# Patient Record
Sex: Male | Born: 1995
Health system: Southern US, Community
[De-identification: ages and names within clinical notes are randomized; demographics above are authoritative.]

## PROBLEM LIST (undated history)

## (undated) DIAGNOSIS — R5383 Other fatigue: Secondary | ICD-10-CM

## (undated) DIAGNOSIS — E301 Precocious puberty: Secondary | ICD-10-CM

## (undated) DIAGNOSIS — R109 Unspecified abdominal pain: Secondary | ICD-10-CM

## (undated) DIAGNOSIS — E049 Nontoxic goiter, unspecified: Secondary | ICD-10-CM

## (undated) DIAGNOSIS — F909 Attention-deficit hyperactivity disorder, unspecified type: Secondary | ICD-10-CM

## (undated) DIAGNOSIS — R112 Nausea with vomiting, unspecified: Secondary | ICD-10-CM

## (undated) DIAGNOSIS — E063 Autoimmune thyroiditis: Secondary | ICD-10-CM

## (undated) DIAGNOSIS — R6250 Unspecified lack of expected normal physiological development in childhood: Secondary | ICD-10-CM

## (undated) DIAGNOSIS — J45909 Unspecified asthma, uncomplicated: Secondary | ICD-10-CM

## (undated) DIAGNOSIS — R0602 Shortness of breath: Secondary | ICD-10-CM

## (undated) HISTORY — DX: Unspecified asthma, uncomplicated: J45.909

## (undated) HISTORY — DX: Unspecified lack of expected normal physiological development in childhood: R62.50

## (undated) HISTORY — DX: Unspecified abdominal pain: R10.9

## (undated) HISTORY — DX: Other disorders of iron metabolism: E83.19

## (undated) HISTORY — DX: Nausea with vomiting, unspecified: R11.2

## (undated) HISTORY — PX: TONSILLECTOMY AND ADENOIDECTOMY: SHX28

## (undated) HISTORY — PX: FRENULECTOMY, LINGUAL: SHX1681

## (undated) HISTORY — DX: Autoimmune thyroiditis: E06.3

## (undated) HISTORY — DX: Nontoxic goiter, unspecified: E04.9

## (undated) HISTORY — DX: Shortness of breath: R06.02

## (undated) HISTORY — DX: Other fatigue: R53.83

## (undated) HISTORY — PX: CHALAZION EXCISION: SHX213

## (undated) HISTORY — DX: Precocious puberty: E30.1

## (undated) HISTORY — DX: Attention-deficit hyperactivity disorder, unspecified type: F90.9

---
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
## 2014-01-02 NOTE — Assessment & Plan Note (Signed)
Confirmed on recent labs.  Has appt upcoming w/ hematology.  Pt has adopted a low iron diet in attempts to improve #s.  Will follow.

## 2014-01-02 NOTE — Assessment & Plan Note (Signed)
Improved since limiting amount of iron in diet.  Has appt upcoming w/ heme.  Will continue to monitor.

## 2014-01-02 NOTE — Assessment & Plan Note (Signed)
Unchanged.  Stressed need for NSAIDs- either naproxen or advil OTC.  Offered ortho appt but pt and family prefer to wait until after they see heme and determine if this is a consequence of high iron or something unrelated.  Will hold on referral at this time but if no improvement in pain, that will be next step.  Pt expressed understanding and is in agreement w/ plan.

## 2014-01-07 ENCOUNTER — Telehealth: Payer: Self-pay | Admitting: Hematology & Oncology

## 2014-01-07 NOTE — Telephone Encounter (Signed)
Left vm w NEW PATIENT today to remind them of their appointment with Dr. Ennever. Also, advised them to bring all meds and insurance information. ° °

## 2014-01-10 ENCOUNTER — Encounter: Payer: Self-pay | Admitting: Hematology & Oncology

## 2014-01-10 ENCOUNTER — Ambulatory Visit (HOSPITAL_BASED_OUTPATIENT_CLINIC_OR_DEPARTMENT_OTHER): Payer: 59 | Admitting: Hematology & Oncology

## 2014-01-10 ENCOUNTER — Other Ambulatory Visit (HOSPITAL_BASED_OUTPATIENT_CLINIC_OR_DEPARTMENT_OTHER): Payer: 59 | Admitting: Lab

## 2014-01-10 ENCOUNTER — Ambulatory Visit: Payer: 59

## 2014-01-10 LAB — CBC WITH DIFFERENTIAL (CANCER CENTER ONLY)
BASO#: 0 10*3/uL (ref 0.0–0.2)
BASO%: 0.8 % (ref 0.0–2.0)
EOS ABS: 0 10*3/uL (ref 0.0–0.5)
EOS%: 0.8 % (ref 0.0–7.0)
HCT: 41.2 % (ref 38.7–49.9)
HGB: 14.9 g/dL (ref 13.0–17.1)
LYMPH#: 1.6 10*3/uL (ref 0.9–3.3)
LYMPH%: 33.4 % (ref 14.0–48.0)
MCH: 33.3 pg (ref 28.0–33.4)
MCHC: 36.2 g/dL — ABNORMAL HIGH (ref 32.0–35.9)
MCV: 92 fL (ref 82–98)
MONO#: 0.4 10*3/uL (ref 0.1–0.9)
MONO%: 8.4 % (ref 0.0–13.0)
NEUT#: 2.7 10*3/uL (ref 1.5–6.5)
NEUT%: 56.6 % (ref 40.0–80.0)
PLATELETS: 220 10*3/uL (ref 145–400)
RBC: 4.48 10*6/uL (ref 4.20–5.70)
RDW: 12.9 % (ref 11.1–15.7)
WBC: 4.8 10*3/uL (ref 4.0–10.0)

## 2014-01-10 NOTE — Progress Notes (Signed)
Referral MD  Reason for Referral: Possible hemochromatosis   Chief Complaint  Patient presents with  . NEW PATIENT  : I have to much iron my body  HPI: Keith Lawrence is a really nice 18 year old gentleman. He does have some endocrine issues. He hasn't seen an endocrinologist.  He is followed by Dr. Beverely Low. Dr. Beverely Low has done some lab work on him.  He has some problems with his left eye. He has seen ophthalmology.  Dr. Beverely Low did some iron studies. She found that he had an eye saturation of 74%. His a total iron was at 237. No ferritin was done.  On his a CBC, this was okay. Hemoglobin was 16 hematocrit 45.  She also did liver studies. Liver function tests were okay.  He does have hypothyroidism. These were checked and found to be okay.  As far as she knows, no in the family has a tab of liver issues.  He really does not drink. He does smoke about half pack per day. He has no obvious occupational exposures.  We were asked to see him to see if there is any issue with hemachromatosis.            Past Medical History  Diagnosis Date  . Physical growth delay   . ADHD (attention deficit hyperactivity disorder)   . Goiter   . Hypothyroidism, acquired, autoimmune   . Thyroiditis, autoimmune   . Isosexual precocity   . Fatigue   . Iron excess   :  Past Surgical History  Procedure Laterality Date  . Chalazion excision    . Frenulectomy, lingual    . Tonsillectomy and adenoidectomy    :  Current outpatient prescriptions:loratadine (CLARITIN) 10 MG tablet, Take 10 mg by mouth daily., Disp: , Rfl: ;  naproxen (NAPROSYN) 500 MG tablet, Take 1 tablet (500 mg total) by mouth 2 (two) times daily with a meal., Disp: 60 tablet, Rfl: 0;  prednisoLONE acetate (PRED FORTE) 1 % ophthalmic suspension, Place 1 drop into the left eye 2 (two) times daily., Disp: , Rfl:  SYNTHROID 25 MCG tablet, Take 1 tablet (25 mcg total) by mouth daily before breakfast., Disp: 30 tablet, Rfl: 5;   valACYclovir (VALTREX) 1000 MG tablet, Take 1,000 mg by mouth daily., Disp: , Rfl: :  :  No Known Allergies:  Family History  Problem Relation Age of Onset  . Thyroid disease Maternal Grandmother   . Heart disease Maternal Grandmother   . Diabetes Neg Hx   . Thyroid disease Paternal Aunt   . Heart disease Paternal Grandfather   :  History   Social History  . Marital Status: Single    Spouse Name: N/A    Number of Children: N/A  . Years of Education: N/A   Occupational History  . Not on file.   Social History Main Topics  . Smoking status: Current Every Day Smoker -- 0.50 packs/day    Types: Cigarettes    Start date: 01/11/2012  . Smokeless tobacco: Never Used     Comment: wants to quit  . Alcohol Use: No  . Drug Use: No     Comment: mushrooms  . Sexual Activity: No   Other Topics Concern  . Not on file   Social History Narrative   Lives with foster parents. Mom still has guardianship.  11th grade at [employer].   :  Pertinent items are noted in HPI.  Exam: @IPVITALS @  well-developed well-nourished gentleman who is of short  stature. Vital signs are temperature of 98.3. Pulse 65. Blood pressure 119/54. Weight is 135 pounds. Head exam is no ocular or oral lesions. There are no palpable cervical or supraclavicular lymph nodes. He does has a slight ptosis of the left eye. Pupils reacted properly. Thyroid is nonpalpable. Lungs are clear and bilaterally. Cardiac exam regular rate rhythm with no murmurs rubs or bruits. Abdomen is soft. Has good bowel sounds. There is no fluid wave. There is a palpable hepatospleno megaly back exam no tenderness over the spine ribs or hips. Extremities shows no clubbing cyanosis or edema. There may be some slight swelling of the first MCP joint of each hand. Skin exam no rashes ecchymosis or petechia. Neurological exam shows no focal neurological deficits.    Recent Labs  01/10/14 1529  WBC 4.8  HGB 14.9  HCT 41.2   PLT 220   No results found for this basename: NA, K, CL, CO2, GLUCOSE, BUN, CREATININE, CALCIUM,  in the last 72 hours  Blood smear review: Normochromic and normocytic red blood cells. There is no rouleau formation. There is no nucleated red cells. I see no teardrop cells. There is no schistocytes or spherocytes. White cells appear normal in morphology maturation. There is no hypersegmented polys. There is no atypical lymphocytes. Platelets are adequate number size.  Pathology: Not necessary     Assessment and Plan: [name] is an 18 year old gentleman. He has one test that I can see that shows too much iron. Need high iron saturation is often a good screening tool for hemachromatosis.  We will see what the genetic analysis shows. It would not surprise me if he has hemachromatosis. Again, with an iron saturation of 74%, this typically is a good marker.  If he does not have hemochromatosis, and then we will have to see what the rest of his iron studies show.  If he has hemachromatosis, I wouldn't think that this would be involved with his endocrinological issues. However, I do know uncontrolled hemachromatosis can affect the pituitary, thyroid, pancreas, and testicular function.  Assessment and 45 minutes with [name] and his stepmom. He is a really good Michelle PiperGuy. He is very funny. We talked a lot about sports.  I will get back in touch with him once I get the results back and then we can sort out what needs to be done.

## 2014-01-10 NOTE — Patient Instructions (Signed)
You Can Quit Smoking If you are ready to quit smoking or are thinking about it, congratulations! You have chosen to help yourself be healthier and live longer! There are lots of different ways to quit smoking. Nicotine gum, nicotine patches, a nicotine inhaler, or nicotine nasal spray can help with physical craving. Hypnosis, support groups, and medicines help break the habit of smoking. TIPS TO GET OFF AND STAY OFF CIGARETTES  Learn to predict your moods. Do not let a bad situation be your excuse to have a cigarette. Some situations in your life might tempt you to have a cigarette.  Ask friends and co-workers not to smoke around you.  Make your home smoke-free.  Never have "just one" cigarette. It leads to wanting another and another. Remind yourself of your decision to quit.  On a card, make a list of your reasons for not smoking. Read it at least the same number of times a day as you have a cigarette. Tell yourself everyday, "I do not want to smoke. I choose not to smoke."  Ask someone at home or work to help you with your plan to quit smoking.  Have something planned after you eat or have a cup of coffee. Take a walk or get other exercise to perk you up. This will help to keep you from overeating.  Try a relaxation exercise to calm you down and decrease your stress. Remember, you may be tense and nervous the first two weeks after you quit. This will pass.  Find new activities to keep your hands busy. Play with a pen, coin, or rubber band. Doodle or draw things on paper.  Brush your teeth right after eating. This will help cut down the craving for the taste of tobacco after meals. You can try mouthwash too.  Try gum, breath mints, or diet candy to keep something in your mouth. IF YOU SMOKE AND WANT TO QUIT:  Do not stock up on cigarettes. Never buy a carton. Wait until one pack is finished before you buy another.  Never carry cigarettes with you at work or at home.  Keep cigarettes  as far away from you as possible. Leave them with someone else.  Never carry matches or a lighter with you.  Ask yourself, "Do I need this cigarette or is this just a reflex?"  Bet with someone that you can quit. Put cigarette money in a piggy bank every morning. If you smoke, you give up the money. If you do not smoke, by the end of the week, you keep the money.  Keep trying. It takes 21 days to change a habit!  Talk to your doctor about using medicines to help you quit. These include nicotine replacement gum, lozenges, or skin patches. Document Released: 07/20/2009 Document Revised: 12/16/2011 Document Reviewed: 07/20/2009 [employer] Patient Information 2014 [employer].  

## 2014-01-11 ENCOUNTER — Encounter: Payer: Self-pay | Admitting: Hematology & Oncology

## 2014-01-11 LAB — IRON AND TIBC CHCC
%SAT: 26 % (ref 20–55)
IRON: 78 ug/dL (ref 42–163)
TIBC: 303 ug/dL (ref 202–409)
UIBC: 224 ug/dL (ref 117–376)

## 2014-01-11 LAB — FERRITIN CHCC: Ferritin: 50 ng/ml (ref 22–316)

## 2014-01-13 ENCOUNTER — Ambulatory Visit: Payer: 59 | Admitting: Hematology & Oncology

## 2014-01-13 ENCOUNTER — Other Ambulatory Visit: Payer: 59 | Admitting: Lab

## 2014-01-13 ENCOUNTER — Ambulatory Visit: Payer: 59

## 2014-01-13 LAB — COMPREHENSIVE METABOLIC PANEL
ALBUMIN: 4.6 g/dL (ref 3.5–5.2)
ALK PHOS: 53 U/L (ref 39–117)
ALT: 15 U/L (ref 0–53)
AST: 16 U/L (ref 0–37)
BUN: 10 mg/dL (ref 6–23)
CO2: 28 mEq/L (ref 19–32)
Calcium: 9.9 mg/dL (ref 8.4–10.5)
Chloride: 102 mEq/L (ref 96–112)
Creatinine, Ser: 0.88 mg/dL (ref 0.50–1.35)
GLUCOSE: 113 mg/dL — AB (ref 70–99)
POTASSIUM: 3.9 meq/L (ref 3.5–5.3)
Sodium: 141 mEq/L (ref 135–145)
TOTAL PROTEIN: 6.7 g/dL (ref 6.0–8.3)
Total Bilirubin: 0.5 mg/dL (ref 0.2–1.1)

## 2014-01-13 LAB — HEMOGLOBINOPATHY EVALUATION
HGB A2 QUANT: 2.6 % (ref 2.2–3.2)
Hemoglobin Other: 0 %
Hgb A: 97.4 % (ref 96.8–97.8)
Hgb F Quant: 0 % (ref 0.0–2.0)
Hgb S Quant: 0 %

## 2014-01-13 LAB — RETICULOCYTES (CHCC)
ABS RETIC: 45.3 10*3/uL (ref 19.0–186.0)
RBC.: 4.53 MIL/uL (ref 4.22–5.81)
RETIC CT PCT: 1 % (ref 0.4–2.3)

## 2014-01-13 LAB — HEMOCHROMATOSIS DNA-PCR(C282Y,H63D): DNA Mutation Analysis: NOT DETECTED

## 2014-01-19 ENCOUNTER — Telehealth: Payer: Self-pay | Admitting: *Deleted

## 2014-01-19 NOTE — Telephone Encounter (Signed)
Message copied by Anselm JunglingBARTKO, Dinnis Rog ELLEN O on Wed Jan 19, 2014 11:49 AM ------      Message from: Arlan OrganENNEVER, PETER R      Created: Mon Jan 17, 2014  6:14 PM       I left a message for his stepmother. I told her in the message that he does not have hemochromatosis. His iron studies are normal. His genetic test is negative for hemachromatosis. We do not have to see him back. I don't see that there is any issues with iron with him. I cannot explain why he had that elevated iron before and but he is not ------

## 2014-01-26 ENCOUNTER — Other Ambulatory Visit: Payer: Self-pay | Admitting: Pediatric Endocrinology

## 2014-07-04 ENCOUNTER — Encounter: Payer: 59 | Admitting: Family Medicine

## 2015-04-12 ENCOUNTER — Telehealth: Payer: Self-pay | Admitting: Family Medicine

## 2015-04-12 ENCOUNTER — Ambulatory Visit (INDEPENDENT_AMBULATORY_CARE_PROVIDER_SITE_OTHER): Payer: 59 | Admitting: Internal Medicine

## 2015-04-12 ENCOUNTER — Encounter: Payer: Self-pay | Admitting: Internal Medicine

## 2015-04-12 VITALS — BP 102/64 | HR 62 | Temp 97.8°F | Ht 62.0 in | Wt 125.0 lb

## 2015-04-12 DIAGNOSIS — H0013 Chalazion right eye, unspecified eyelid: Secondary | ICD-10-CM

## 2015-04-12 NOTE — Progress Notes (Signed)
Pre visit review using our clinic review tool, if applicable. No additional management support is needed unless otherwise documented below in the visit note. 

## 2015-04-12 NOTE — Progress Notes (Signed)
   Subjective:    Patient ID: Keith Lawrence, male    DOB: [birth year], 19 y.o.   MRN: [id number]  DOS:  04/12/2015 Type of visit - description : Acute Interval history: Symptoms started a week ago with swelling and mild tenderness on the right upper eyelid. Denies fever or chills Has not seen any discharge No pain with eye movement however the area is TTP   Review of Systems   Past Medical History  Diagnosis Date  . Physical growth delay   . ADHD (attention deficit hyperactivity disorder)   . Goiter   . Hypothyroidism, acquired, autoimmune   . Thyroiditis, autoimmune   . Isosexual precocity   . Fatigue   . Iron excess     Past Surgical History  Procedure Laterality Date  . Chalazion excision    . Frenulectomy, lingual    . Tonsillectomy and adenoidectomy      History   Social History  . Marital Status: Single    Spouse Name: N/A  . Number of Children: N/A  . Years of Education: N/A   Occupational History  . Not on file.   Social History Main Topics  . Smoking status: Former Smoker -- 0.50 packs/day    Types: Cigarettes    Start date: 01/11/2012  . Smokeless tobacco: Never Used     Comment: quit   . Alcohol Use: No  . Drug Use: No     Comment: mushrooms  . Sexual Activity: No   Other Topics Concern  . Not on file   Social History Narrative   Lives with foster parents. Mom still has guardianship.  [employer].         Medication List       This list is accurate as of: 04/12/15  5:32 PM.  Always use your most recent med list.               SYNTHROID 25 MCG tablet  Generic drug:  levothyroxine  Take 1 tablet (25 mcg total) by mouth daily before breakfast.           Objective:   Physical Exam  Constitutional: He appears well-developed and well-nourished. No distress.  HENT:  Head: Normocephalic and atraumatic.  Eyes:    Skin: He is not diaphoretic.   BP 102/64 mmHg  Pulse 62  Temp(Src) 97.8 F (36.6 C) (Oral)  Ht 5\' 2"   (1.575 m)  Wt 125 lb (56.7 kg)  BMI 22.86 kg/m2  SpO2 99%       Assessment & Plan:    Chalazion , Findings consistent with chalazion, urgent referral to ophthalmology, at this point there is no evidence of periorbital cellulitis

## 2015-04-12 NOTE — Patient Instructions (Signed)
We are referring you to the eye doctor today

## 2015-04-12 NOTE — Telephone Encounter (Signed)
[facility] Relation to pt: Call back number: Pharmacy:  Reason for call:   Eunice BlaseDebbie states that patient "no showed" to appointment today

## 2015-04-12 NOTE — Telephone Encounter (Signed)
Noted, we were never able to get in contact with Pt. Pt's cell phone number was disconnected. Tried calling Pt's step mom's number and fathers number and left messages regarding appt but was never able to speak with anyone.

## 2015-06-19 ENCOUNTER — Telehealth: Payer: Self-pay | Admitting: *Deleted

## 2015-06-19 NOTE — Telephone Encounter (Signed)
Received a message from the answering service that patient needs synthroid refill. I called [phone], the message identified it as "Keith Lawrence" which is what he prefers. I LVM and explained that we have not seen him in 2 years and cannot refill this. I advised for him to ask Dr. Beverely Low if she will manage his thyroid medicine. Since he is turning 19, we haven't seen him in 2 years and we are no longer taking new adult patients, he would need to speak to his PCP or be referred to an adult endo.

## 2015-08-12 ENCOUNTER — Other Ambulatory Visit: Payer: Self-pay | Admitting: "Endocrinology

## 2015-08-26 ENCOUNTER — Emergency Department (HOSPITAL_COMMUNITY): Payer: 59

## 2015-08-26 ENCOUNTER — Emergency Department (HOSPITAL_COMMUNITY)
Admission: EM | Admit: 2015-08-26 | Discharge: 2015-08-26 | Disposition: A | Discharge status: Home / Self Care | Payer: 59 | Attending: Emergency Medicine | Admitting: Emergency Medicine

## 2015-08-26 DIAGNOSIS — Z87891 Personal history of nicotine dependence: Secondary | ICD-10-CM | POA: Insufficient documentation

## 2015-08-26 DIAGNOSIS — R079 Chest pain, unspecified: Secondary | ICD-10-CM | POA: Insufficient documentation

## 2015-08-26 DIAGNOSIS — E038 Other specified hypothyroidism: Secondary | ICD-10-CM | POA: Insufficient documentation

## 2015-08-26 DIAGNOSIS — Z79899 Other long term (current) drug therapy: Secondary | ICD-10-CM | POA: Insufficient documentation

## 2015-08-26 DIAGNOSIS — R05 Cough: Secondary | ICD-10-CM | POA: Insufficient documentation

## 2015-08-26 DIAGNOSIS — Z8659 Personal history of other mental and behavioral disorders: Secondary | ICD-10-CM | POA: Insufficient documentation

## 2015-08-26 DIAGNOSIS — R059 Cough, unspecified: Secondary | ICD-10-CM

## 2015-08-26 LAB — CBC WITH DIFFERENTIAL/PLATELET
BASOS ABS: 0 10*3/uL (ref 0.0–0.1)
Basophils Relative: 0 %
Eosinophils Absolute: 0.1 10*3/uL (ref 0.0–0.7)
Eosinophils Relative: 1 %
HEMATOCRIT: 47.9 % (ref 39.0–52.0)
Hemoglobin: 16.8 g/dL (ref 13.0–17.0)
Lymphocytes Relative: 22 %
Lymphs Abs: 1.5 10*3/uL (ref 0.7–4.0)
MCH: 30.9 pg (ref 26.0–34.0)
MCHC: 35.1 g/dL (ref 30.0–36.0)
MCV: 88.1 fL (ref 78.0–100.0)
Monocytes Absolute: 0.6 10*3/uL (ref 0.1–1.0)
Monocytes Relative: 8 %
NEUTROS ABS: 4.8 10*3/uL (ref 1.7–7.7)
NEUTROS PCT: 69 %
PLATELETS: 169 10*3/uL (ref 150–400)
RBC: 5.44 MIL/uL (ref 4.22–5.81)
RDW: 12.4 % (ref 11.5–15.5)
WBC: 6.9 10*3/uL (ref 4.0–10.5)

## 2015-08-26 LAB — BASIC METABOLIC PANEL
ANION GAP: 12 (ref 5–15)
BUN: 14 mg/dL (ref 6–20)
CO2: 23 mmol/L (ref 22–32)
Calcium: 9.9 mg/dL (ref 8.9–10.3)
Chloride: 102 mmol/L (ref 101–111)
Creatinine, Ser: 0.7 mg/dL (ref 0.61–1.24)
GFR calc Af Amer: >60 mL/min (ref >60)
GLUCOSE: 89 mg/dL (ref 65–99)
POTASSIUM: 4.4 mmol/L (ref 3.5–5.1)
Sodium: 137 mmol/L (ref 135–145)

## 2015-08-26 LAB — I-STAT TROPONIN, ED: Troponin i, poc: 0 ng/mL (ref 0.00–0.08)

## 2015-08-26 LAB — RAPID STREP SCREEN (MED CTR MEBANE ONLY): STREPTOCOCCUS, GROUP A SCREEN (DIRECT): NEGATIVE

## 2015-08-26 MED ORDER — BENZONATATE 100 MG PO CAPS
100.0000 mg | ORAL_CAPSULE | Freq: Once | ORAL | Status: AC
  Administered 2015-08-26: 100 mg via ORAL
  Filled 2015-08-26: qty 1

## 2015-08-26 MED ORDER — BENZONATATE 100 MG PO CAPS: 100.0000 mg | ORAL_CAPSULE | Freq: Three times a day (TID) | ORAL | Status: DC

## 2015-08-26 MED ORDER — IBUPROFEN 800 MG PO TABS
800.0000 mg | ORAL_TABLET | Freq: Once | ORAL | Status: AC
  Administered 2015-08-26: 800 mg via ORAL
  Filled 2015-08-26: qty 1
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
## 2015-08-26 NOTE — Discharge Instructions (Signed)
1. Medications: tessalon (cough medicine), usual home medications 2. Treatment: rest, drink plenty of fluids 3. Follow Up: please followup with your primary doctor for discussion of your diagnoses and further evaluation after today's visit; if you do not have a primary care doctor use the resource guide provided to find one; please return to the ER for severe chest pain or shortness of breath, new or worsening symptoms   Chest Wall Pain Chest wall pain is pain in or around the bones and muscles of your chest. Sometimes, an injury causes this pain. Sometimes, the cause may not be known. This pain may take several weeks or longer to get better. HOME CARE INSTRUCTIONS  Pay attention to any changes in your symptoms. Take these actions to help with your pain:   Rest as told by your health care provider.   Avoid activities that cause pain. These include any activities that use your chest muscles or your abdominal and side muscles to lift heavy items.   If directed, apply ice to the painful area:  Put ice in a plastic bag.  Place a towel between your skin and the bag.  Leave the ice on for 20 minutes, 2-3 times per day.  Take over-the-counter and prescription medicines only as told by your health care provider.  Do not use tobacco products, including cigarettes, chewing tobacco, and e-cigarettes. If you need help quitting, ask your health care provider.  Keep all follow-up visits as told by your health care provider. This is important. SEEK MEDICAL CARE IF:  You have a fever.  Your chest pain becomes worse.  You have new symptoms. SEEK IMMEDIATE MEDICAL CARE IF:  You have nausea or vomiting.  You feel sweaty or light-headed.  You have a cough with phlegm (sputum) or you cough up blood.  You develop shortness of breath.   This information is not intended to replace advice given to you by your health care provider. Make sure you discuss any questions you have with your health  care provider.   Document Released: 09/23/2005 Document Revised: 06/14/2015 Document Reviewed: 12/19/2014 Elsevier Interactive Patient Education 2016 [employer]   Emergency Department Resource Guide 1) Find a Doctor and Pay Out of Pocket Although you won't have to find out who is covered by your insurance plan, it is a good idea to ask around and get recommendations. You will then need to call the office and see if the doctor you have chosen will accept you as a new patient and what types of options they offer for patients who are self-pay. Some doctors offer discounts or will set up payment plans for their patients who do not have insurance, but you will need to ask so you aren't surprised when you get to your appointment.  2) Contact Your Local Health Department Not all health departments have doctors that can see patients for sick visits, but many do, so it is worth a call to see if yours does. If you don't know where your local health department is, you can check in your phone book. The CDC also has a tool to help you locate your state's health department, and many state websites also have listings of all of their local health departments.  3) Find a Walk-in Clinic If your illness is not likely to be very severe or complicated, you may want to try a walk in clinic. These are popping up all over the country in pharmacies, drugstores, and shopping centers. They're usually staffed by nurse practitioners  or physician assistants that have been trained to treat common illnesses and complaints. They're usually fairly quick and inexpensive. However, if you have serious medical issues or chronic medical problems, these are probably not your best option. ° °No Primary Care Doctor: °- Call [facility] Connect at  832-8000 - they can help you locate a primary care doctor that  accepts your insurance, provides certain services, etc. °- Physician Referral Service- 1-800-533-3463 ° °Chronic Pain  Problems: °Organization         Address  Phone   Notes  °[facility]  (336) 297-2271 Patients need to be referred by their primary care doctor.  ° °Medication Assistance: °Organization         Address  Phone   Notes  °[place] [facility] [address] °[place], [place] [address] (336) 641-8030 --Must be a resident of [place] °-- Must have NO insurance coverage whatsoever (no Medicaid/ Medicare, etc.) °-- The pt. MUST have a primary care doctor that directs their care regularly and follows them in the community °  °[facility]  (866) 331-1348   °[employer]  (888) 892-1162   ° °Agencies that provide inexpensive medical care: °Organization         Address  Phone   Notes  °[facility]  (336) 832-8035   °[facility]    (336) 832-7272   °[facility] [address] °[place], [place] [address] (336) 832-4777   °[facility] [address], °[place] (336) 271-4999   °[facility]    (336) 373-0678   °[facility]    (336) 272-1050   °[facility] ° [address], [place] Phone:  (336) 832-4444, Fax:  (336) 832-4440 Hours of Operation:  9 am - 6 pm, M-F.  Also accepts Medicaid/Medicare and self-pay.  °[facility] ° [address], [place] Phone: (336) 832-3150, Fax: (336) 832-3151. Hours of Operation:  8:30 am - 5:30 pm, M-F.  Also accepts Medicaid and self-pay.  °[facility] [place] [address], [place] Phone: (336) 878-6027   °[facility] [address], [place], [place] (336)723-1848, Ext. 123 Mondays & Thursdays: 7-9 AM.  First 15 patients are seen on a first come, first serve basis. °  ° °Medicaid-accepting [place] Providers: ° °Organization         Address  Phone   Notes  °[facility] [address], [place] (336) 641-2100 Also  accepts self-pay patients.  °[facility] [address], [place] ° (336) 856-9996   °[facility] [address] [place] [address], [place] (336) 288-8857   °[facility] [address] [place] [address], [place] (336) 299-7000   °Veita Bland [address], [place]  ° (336) 373-1557 Only accepts [place] Access Medicaid patients after they have their name applied to their card.  ° °Self-Pay (no insurance) in [place]: ° °Organization         Address  Phone   Notes  °Sickle Cell Patients, [facility] [address], [place] (336) 832-1970   °[facility] [address],  (336) 832-4400   ° [facility] ° [address], [place] (336) 992-4800   °[facility]/Dr. Osei-Bonsu ° [address]   [place] [address], [place] or [address], [place] (336) 841-8500 Phone number for both [place] and [place] locations is the same.  °[facility] [address], [place] (336) 299-0000   °[facility] [address] [place] [address], [place] or [address] (336) 852-7530 °(336) 878-2260   °[facility] [address], [place] (336) 350-1642, phone; (336) 294-5005, fax Sees patients 1st and 3rd Saturday of every month.  Must not qualify for public or private insurance (i.e. Medicaid, Medicare, [place] Health Choice, Veterans' Benefits) • Household income should be no more than 200% of the poverty level •The clinic cannot treat you if you are pregnant or think you are pregnant • Sexually transmitted diseases are not treated at the clinic.  ° ° °Dental Care: °Organization         Address  Phone  Notes  °[facility] [address], [place] (336) 641-6152 Accepts children up to age 21 who are enrolled in Medicaid or [place] Health Choice; pregnant  women with a Medicaid card; and children who have applied for Medicaid or [place] Health Choice, but were declined, whose parents can pay a reduced fee at time of service.  °[facility] [place]  [address], [place] (336) 641-7733 Accepts children up to age 21 who are enrolled in Medicaid or [place] Health Choice; pregnant women with a Medicaid card; and children who have applied for Medicaid or [place] Health Choice, but were declined, whose parents can pay a reduced fee at time of service.  °[facility] ° [address], [place] (336) 641-4533 Patients are seen by appointment only. Walk-ins are not accepted. [facility] will see patients 18 years of age and older. °Monday - Tuesday (8am-5pm) °Most Wednesdays (8:30-5pm) °$30 per visit, cash only  °[facility] ° [address], [place] (336) 641-4533 Patients are seen by appointment only. Walk-ins are not accepted. [facility] will see patients 18 years of age and older. °One Wednesday Evening (Monthly: Volunteer Based).  $30 per visit, cash only  °[facility]  (919) 537-3737 for adults; Children under age 4, call Graduate Pediatric Dentistry at (919) [phone]. Children aged 4-14, please call (919) [phone] to request a pediatric application. ° Dental services are provided in all areas of dental care including fillings, crowns and bridges, complete and partial dentures, implants, gum treatment, root canals, and extractions. Preventive care is also provided. Treatment is provided to both adults and children. °Patients are selected via a lottery and there is often a waiting list. °  °[facility] [address], °[place] ° (336) 763-8833 [email or web address] °  °[facility] [address], [place], [place] (336)723-1848, Ext. 123 Second and Fourth Thursday of each month, opens at 6:30 AM; Clinic ends at 9 AM.  Patients are  seen on a first-come first-served basis, and a limited number are seen during each clinic.  ° °[facility] ° [address] [place] [address], [place], [place] (336) 723-7904   Eligibility Requirements °You must have lived in [place], or [place] counties for at least the last three months. °  You cannot be eligible for state or federal sponsored healthcare insurance, including Veterans Administration, Medicaid, or Medicare. °  You generally cannot be eligible for healthcare insurance through your employer.  °    How to apply: °Eligibility screenings are held every Tuesday and Wednesday afternoon from 1:00 pm until 4:00 pm. You do not need an appointment for the interview!  °[facility] [address], [place], [place] 336-631-2330   °[facility]  336-342-8273   °[facility]  336-703-3100   °[facility]  336-570-6415   ° °Behavioral Health Resources in the Community: °Intensive Outpatient Programs °Organization         Address  Phone  Notes  °[facility] [address], [place], [place] 336-878-6098   °[facility] [address], [place], [place] 336-832-9800   °[facility] [address], [place], [place] ° 336-882-2125   °[facility] [address],  °[place], [place] 1-800-853-5163 or 336-641-4981   °Substance Abuse Resources °Organization         Address  Phone  Notes  °[facility]  336-882-2125   °[facility]  336-784-9470   °[facility]  336-285-9073   °[facility]  336-845-3988   °Residential & Outpatient Substance Abuse Program  1-800-659-3381   °Psychological Services °Organization         Address  Phone  Notes  °[facility]  336- 832-9600   °[facility]  336- 378-7881   °[facility] [address], [place] 1-800-853-5163 or 336-641-4981   ° °Mobile Crisis  Teams °Organization         Address  Phone  Notes  °[facility], Mobile Crisis Care Unit  1-877-626-1772   °Assertive °[facility] ° [address] [place], [place] 336-834-9664   °Sharon DeEsch [address] °[place] 336-554-5454   ° °Self-Help/Support Groups °Organization         Address  Phone             Notes  °[facility] - variety of support groups  336- 373-1402 Call for more information  °Narcotics Anonymous (NA), [facility] [address], °[place]  2 meetings at this location  ° °Residential Treatment Programs °Organization         Address  Phone  Notes  °[facility] [address],    °[place]  1-866-801-8205   °[facility] ° [address], [place], [place] 704-293-8524   °[facility] [address], [place] 336-845-3988 Admissions: 8am-3pm M-F  °[facility] [address]    °[place], [place] 336-841-1104   °[facility] [address], [place], [place] 336-379-7146   °[facility] [address]  °[place], [place] 336-285-9073   °[facility] Intensive Outpatient [address], [place], [place] 336-852-3033   °[facility] [address]  °[place], [place] 1-877-615-2722 or 336-784-9470   °[facility] [address] [place], [place] 336-227-7417 Accepts Medicaid  °[facility] [address]  °[place] 1-800-659-3381 Substance Abuse/Addiction Treatment  ° °[place] Behavioral Health Resources °Organization         Address  Phone  Notes  °[facility]  (888) 581-9988   °Julie Brannon, PhD [address] [place], [place]   (336) 349-5553 or (336) 951-0000   °[facility]   [address] °[place], [place] (336) 349-4454   °[facility] [address],   [place], [place] (336) 342-8316  Insurance/Medicaid/sponsorship through [facility]  °[facility] [address]                                    [place], [place] (336) 342-8316 Therapy/tele-psych/case  °[facility] [address]  ° [place], [place] (336) 349-2233    °Dr. Arfeen  (336) 349-4544   °[facility] [place]  [employer] [place] [facility] 1) [address], [place] °2) [address], [place] °3)  [address], [place] (336) 349-3220 °(336) 342-7768 ° °(336) 342-8140   °[place] [facility] (336) 342-1394 or (336) 342-3537 (After Hours)    ° ° ° °
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
## 2015-08-26 NOTE — ED Notes (Signed)
Bed: WA08 Expected date: 08/26/15 Expected time: 7:44 AM Means of arrival: Ambulance Comments: Reproducible chest pain 19 yo

## 2015-08-29 LAB — CULTURE, GROUP A STREP

## 2015-10-22 DIAGNOSIS — E039 Hypothyroidism, unspecified: Secondary | ICD-10-CM | POA: Diagnosis not present

## 2015-10-25 ENCOUNTER — Encounter (HOSPITAL_BASED_OUTPATIENT_CLINIC_OR_DEPARTMENT_OTHER): Payer: Self-pay

## 2015-10-25 ENCOUNTER — Emergency Department (HOSPITAL_BASED_OUTPATIENT_CLINIC_OR_DEPARTMENT_OTHER)
Admission: EM | Admit: 2015-10-25 | Discharge: 2015-10-25 | Disposition: A | Discharge status: Home / Self Care | Payer: 59 | Attending: Emergency Medicine | Admitting: Emergency Medicine

## 2015-10-25 DIAGNOSIS — Z8659 Personal history of other mental and behavioral disorders: Secondary | ICD-10-CM | POA: Diagnosis not present

## 2015-10-25 DIAGNOSIS — J069 Acute upper respiratory infection, unspecified: Secondary | ICD-10-CM | POA: Insufficient documentation

## 2015-10-25 DIAGNOSIS — Z87891 Personal history of nicotine dependence: Secondary | ICD-10-CM | POA: Diagnosis not present

## 2015-10-25 DIAGNOSIS — R52 Pain, unspecified: Secondary | ICD-10-CM | POA: Diagnosis present

## 2015-10-25 DIAGNOSIS — Z79899 Other long term (current) drug therapy: Secondary | ICD-10-CM | POA: Insufficient documentation

## 2015-10-25 DIAGNOSIS — Z113 Encounter for screening for infections with a predominantly sexual mode of transmission: Secondary | ICD-10-CM | POA: Insufficient documentation

## 2015-10-25 DIAGNOSIS — E038 Other specified hypothyroidism: Secondary | ICD-10-CM | POA: Diagnosis not present

## 2015-10-25 DIAGNOSIS — E049 Nontoxic goiter, unspecified: Secondary | ICD-10-CM | POA: Diagnosis not present

## 2015-10-25 LAB — URINALYSIS, ROUTINE W REFLEX MICROSCOPIC
BILIRUBIN URINE: NEGATIVE
Glucose, UA: NEGATIVE mg/dL
HGB URINE DIPSTICK: NEGATIVE
KETONES UR: NEGATIVE mg/dL
Leukocytes, UA: NEGATIVE
NITRITE: NEGATIVE
Protein, ur: NEGATIVE mg/dL
SPECIFIC GRAVITY, URINE: 1.027 (ref 1.005–1.030)
pH: 6.5 (ref 5.0–8.0)

## 2015-10-25 MED ORDER — GUAIFENESIN ER 600 MG PO TB12: 600.0000 mg | ORAL_TABLET | Freq: Two times a day (BID) | ORAL | Status: DC

## 2015-10-25 MED ORDER — BENZONATATE 100 MG PO CAPS: 100.0000 mg | ORAL_CAPSULE | Freq: Three times a day (TID) | ORAL | Status: DC | PRN

## 2015-10-25 MED ORDER — IBUPROFEN 800 MG PO TABS
800.0000 mg | ORAL_TABLET | Freq: Once | ORAL | Status: AC
  Administered 2015-10-25: 800 mg via ORAL
  Filled 2015-10-25: qty 1

## 2015-10-25 NOTE — Discharge Instructions (Signed)
You were seen in the emergency room for evaluation of cough, sore throat, and body aches. Your symptoms are most likely due to a virus. You may take  ibuprofen every 8 hours as needed for the body aches and sore throat. I will give you a prescription to help with your cough. Your urine test was normal today with no signs of infection.  We will send out your urine and bloodwork for STI screening and will call you if anything comes back positive. Please follow-up with your primary care provider within one week. Return to the ER for new or worsening symptoms.

## 2015-10-25 NOTE — ED Notes (Signed)
C/o body aches x 2 weeks-dry cough, sore throat x 1 week
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
## 2015-10-26 LAB — HIV ANTIBODY (ROUTINE TESTING W REFLEX): HIV Screen 4th Generation wRfx: NONREACTIVE

## 2015-10-26 LAB — GC/CHLAMYDIA PROBE AMP (~~LOC~~) NOT AT ARMC
Chlamydia: NEGATIVE
Neisseria Gonorrhea: NEGATIVE

## 2015-10-26 LAB — RPR: RPR Ser Ql: NONREACTIVE

## 2016-01-27 ENCOUNTER — Encounter (HOSPITAL_BASED_OUTPATIENT_CLINIC_OR_DEPARTMENT_OTHER): Payer: Self-pay

## 2016-01-27 ENCOUNTER — Emergency Department (HOSPITAL_BASED_OUTPATIENT_CLINIC_OR_DEPARTMENT_OTHER)
Admission: EM | Admit: 2016-01-27 | Discharge: 2016-01-27 | Disposition: A | Discharge status: Home / Self Care | Payer: 59 | Attending: Emergency Medicine | Admitting: Emergency Medicine

## 2016-01-27 DIAGNOSIS — K2970 Gastritis, unspecified, without bleeding: Secondary | ICD-10-CM | POA: Diagnosis not present

## 2016-01-27 DIAGNOSIS — F1290 Cannabis use, unspecified, uncomplicated: Secondary | ICD-10-CM

## 2016-01-27 DIAGNOSIS — Z87891 Personal history of nicotine dependence: Secondary | ICD-10-CM | POA: Diagnosis not present

## 2016-01-27 DIAGNOSIS — R1032 Left lower quadrant pain: Secondary | ICD-10-CM | POA: Diagnosis not present

## 2016-01-27 DIAGNOSIS — R1116 Cannabis hyperemesis syndrome: Secondary | ICD-10-CM

## 2016-01-27 DIAGNOSIS — R112 Nausea with vomiting, unspecified: Secondary | ICD-10-CM | POA: Insufficient documentation

## 2016-01-27 DIAGNOSIS — F1210 Cannabis abuse, uncomplicated: Secondary | ICD-10-CM | POA: Diagnosis not present

## 2016-01-27 DIAGNOSIS — F12988 Cannabis use, unspecified with other cannabis-induced disorder: Secondary | ICD-10-CM

## 2016-01-27 DIAGNOSIS — R197 Diarrhea, unspecified: Secondary | ICD-10-CM | POA: Diagnosis not present

## 2016-01-27 LAB — BASIC METABOLIC PANEL
Anion gap: 11 (ref 5–15)
BUN: 12 mg/dL (ref 6–20)
CALCIUM: 9.6 mg/dL (ref 8.9–10.3)
CO2: 23 mmol/L (ref 22–32)
CREATININE: 0.88 mg/dL (ref 0.61–1.24)
Chloride: 105 mmol/L (ref 101–111)
GFR calc Af Amer: >60 mL/min (ref >60)
GLUCOSE: 100 mg/dL — AB (ref 65–99)
POTASSIUM: 3.6 mmol/L (ref 3.5–5.1)
SODIUM: 139 mmol/L (ref 135–145)

## 2016-01-27 LAB — CBC WITH DIFFERENTIAL/PLATELET
BASOS ABS: 0 10*3/uL (ref 0.0–0.1)
BASOS PCT: 0 %
EOS ABS: 0.1 10*3/uL (ref 0.0–0.7)
EOS PCT: 1 %
HCT: 42 % (ref 39.0–52.0)
Hemoglobin: 15.4 g/dL (ref 13.0–17.0)
Lymphocytes Relative: 29 %
Lymphs Abs: 2.1 10*3/uL (ref 0.7–4.0)
MCH: 31.6 pg (ref 26.0–34.0)
MCHC: 36.7 g/dL — ABNORMAL HIGH (ref 30.0–36.0)
MCV: 86.1 fL (ref 78.0–100.0)
MONO ABS: 0.7 10*3/uL (ref 0.1–1.0)
Monocytes Relative: 10 %
Neutro Abs: 4.3 10*3/uL (ref 1.7–7.7)
Neutrophils Relative %: 60 %
PLATELETS: 210 10*3/uL (ref 150–400)
RBC: 4.88 MIL/uL (ref 4.22–5.81)
RDW: 12.1 % (ref 11.5–15.5)
WBC: 7.1 10*3/uL (ref 4.0–10.5)

## 2016-01-27 LAB — LIPASE, BLOOD: LIPASE: 14 U/L (ref 11–51)

## 2016-01-27 MED ORDER — PROMETHAZINE HCL 25 MG PO TABS: 25.0000 mg | ORAL_TABLET | Freq: Four times a day (QID) | ORAL | Status: DC | PRN

## 2016-01-27 MED ORDER — LORAZEPAM 2 MG/ML IJ SOLN
1.0000 mg | Freq: Once | INTRAMUSCULAR | Status: AC
  Administered 2016-01-27: 1 mg via INTRAVENOUS
  Filled 2016-01-27: qty 1

## 2016-01-27 MED ORDER — HALOPERIDOL LACTATE 5 MG/ML IJ SOLN
2.5000 mg | Freq: Once | INTRAMUSCULAR | Status: AC
  Administered 2016-01-27: 2.5 mg via INTRAVENOUS
  Filled 2016-01-27: qty 1

## 2016-01-27 MED ORDER — CAPSAICIN-MENTHOL-METHYL SAL 0.025-1-12 % EX CREA: 1.0000 [in_us] | TOPICAL_CREAM | Freq: Three times a day (TID) | CUTANEOUS | Status: DC | PRN

## 2016-01-27 MED ORDER — SODIUM CHLORIDE 0.9 % IV BOLUS (SEPSIS)
1000.0000 mL | Freq: Once | INTRAVENOUS | Status: AC
  Administered 2016-01-27: 1000 mL via INTRAVENOUS

## 2016-01-27 NOTE — ED Provider Notes (Signed)
CSN: [id number]     Arrival date & time 01/27/16  1957 History  By signing my name below, I, Marisue HumbleMichelle Chaffee, attest that this documentation has been prepared under the direction and in the presence of Nelva Nayobert Noya Santarelli, MD . Electronically Signed: Marisue HumbleMichelle Chaffee, Scribe. 01/27/2016. 8:46 PM.    Chief Complaint  Patient presents with  . Abdominal Pain   The history is provided by the patient. No language interpreter was used.   HPI Comments:  Keith Lawrence is a 20 y.o. male with PMHx of hypothyroidism who presents to the Emergency Department complaining of peristent nausea and vomiting for the past week. He states he can't keep anything down but still has an appetite. Pt reports associated LLQ abdominal pain. Pt has tried a hot shower with temporary relief. Pt has smoked mariajuana ~1 week ago. Pt requested his thyroid levels be checked. He is currently taking Synthroid for hypothyroidism.  Past Medical History  Diagnosis Date  . Physical growth delay   . ADHD (attention deficit hyperactivity disorder)   . Goiter   . Hypothyroidism, acquired, autoimmune   . Thyroiditis, autoimmune   . Isosexual precocity   . Fatigue   . Iron excess    Past Surgical History  Procedure Laterality Date  . Chalazion excision    . Frenulectomy, lingual    . Tonsillectomy and adenoidectomy     Family History  Problem Relation Age of Onset  . Thyroid disease Maternal Grandmother   . Heart disease Maternal Grandmother   . Diabetes Neg Hx   . Thyroid disease Paternal Aunt   . Heart disease Paternal Grandfather    Social History  Substance Use Topics  . Smoking status: Former Smoker -- 0.00 packs/day    Start date: 01/11/2012  . Smokeless tobacco: Never Used  . Alcohol Use: No    Review of Systems 10 systems reviewed and all are negative for acute change except as noted in the HPI.  Allergies  Review of patient's allergies indicates no known allergies.  Home Medications   Prior to Admission  medications   Medication Sig Start Date End Date Taking? Authorizing Provider  benzonatate (TESSALON) 100 MG capsule Take 1 capsule (100 mg total) by mouth 3 (three) times daily as needed for cough. 10/25/15   Ace GinsSerena Y Sam, PA-C  Capsaicin-Menthol-Methyl Sal (CAPSAICIN-METHYL SAL-MENTHOL) 0.025-1-12 % CREA Apply 1 inch topically 3 (three) times daily as needed. 01/27/16   Nelva Nayobert Arilynn Blakeney, MD  guaiFENesin (MUCINEX) 600 MG 12 hr tablet Take 1 tablet (600 mg total) by mouth 2 (two) times daily. 10/25/15   Carlene CoriaSerena Y Sam, PA-C  promethazine (PHENERGAN) 25 MG tablet Take 1 tablet (25 mg total) by mouth every 6 (six) hours as needed for nausea or vomiting. 01/27/16   Nelva Nayobert Roshelle Traub, MD  SYNTHROID 25 MCG tablet Take 1 tablet (25 mcg total) by mouth daily before breakfast. 08/23/13   Dessa PhiJennifer Badik, MD   BP 118/70 mmHg  Pulse 74  Temp(Src) 98.4 F (36.9 C) (Oral)  Resp 15  Ht 5\' 4"  (1.626 m)  Wt 130 lb (58.968 kg)  BMI 22.30 kg/m2  SpO2 97% Physical Exam  Constitutional: He is oriented to person, place, and time. He appears well-developed and well-nourished. No distress.  HENT:  Head: Normocephalic and atraumatic.  Eyes: Pupils are equal, round, and reactive to light.  Neck: Normal range of motion.  Cardiovascular: Normal rate and intact distal pulses.   Pulmonary/Chest: No respiratory distress.  Abdominal: Soft. Normal appearance and bowel  sounds are normal. He exhibits no distension. There is no tenderness. There is no rebound and no guarding.  Musculoskeletal: Normal range of motion.  Neurological: He is alert and oriented to person, place, and time. No cranial nerve deficit.  Skin: Skin is warm and dry. No rash noted.  Psychiatric: He has a normal mood and affect. His behavior is normal.  Nursing note and vitals reviewed.   ED Course  Procedures  Medications  sodium chloride 0.9 % bolus 1,000 mL (0 mLs Intravenous Stopped 01/27/16 2152)  haloperidol lactate (HALDOL) injection 2.5 mg (2.5 mg  Intravenous Given 01/27/16 2111)  sodium chloride 0.9 % bolus 1,000 mL (1,000 mLs Intravenous New Bag/Given 01/27/16 2158)  LORazepam (ATIVAN) injection 1 mg (1 mg Intravenous Given 01/27/16 2159)    DIAGNOSTIC STUDIES:  Oxygen Saturation is 100% on RA, normal by my interpretation.    COORDINATION OF CARE:  8:43 PM Will start IV, administer medication and PO challenge. Recommended pt reduce his mariajuana use. Discussed treatment plan with pt at bedside and pt agreed to plan.  Labs Review Labs Reviewed  BASIC METABOLIC PANEL - Abnormal; Notable for the following:    Glucose, Bld 100 (*)    All other components within normal limits  CBC WITH DIFFERENTIAL/PLATELET - Abnormal; Notable for the following:    MCHC 36.7 (*)    All other components within normal limits  LIPASE, BLOOD  TSH  T4    Imaging Review No results found. I have personally reviewed and evaluated these images and lab results as part of my medical decision-making.    MDM   Final diagnoses:  Cannabinoid hyperemesis syndrome (HCC)   I personally performed the services described in this documentation, which was scribed in my presence. The recorded information has been reviewed and considered.   Nelva Nay, MD 01/27/16 [phone]

## 2016-01-27 NOTE — ED Notes (Signed)
Patient reports of LLQ abdominal pain with nausea and vomiting x1 week.  States he smoked marijuana x1 week ago that may have been laced with meth but unsure.  States he has felt bad since.

## 2016-01-27 NOTE — Discharge Instructions (Signed)
Cannabis Use Disorder Cannabis use disorder is a mental disorder. It is not one-time or occasional use of cannabis, more commonly known as marijuana. Cannabis use disorder is the continued, nonmedical use of cannabis that interferes with normal life activities or causes health problems. People with cannabis use disorder get a feeling of extreme pleasure and relaxation from cannabis use. This "high" is very rewarding and causes people to use over and over.  The mind-altering ingredient in cannabis is know as THC. THC can also interfere with motor coordination, memory, judgment, and accurate sense of space and time. These effects can last for a few days after using cannabis. Regular heavy cannabis use can cause long-lasting problems with thinking and learning. In young people, these problems may be permanent. Cannabis sometimes causes severe anxiety, paranoia, or visual hallucinations. Man-made (synthetic) cannabis-like drugs, such as "spice" and "K2," cause the same effects as THC but are much stronger. Cannabis-like drugs can cause dangerously high blood pressure and heart rate.  Cannabis use disorder usually starts in the teenage years. It can trigger the development of schizophrenia. It is somewhat more common in men than women. People who have family members with the disorder or existing mental health issues such as depression and posttraumatic stress disorderare more likely to develop cannabis use disorder. People with cannabis use disorder are at higher risk for use of other drugs of abuse.  SIGNS AND SYMPTOMS Signs and symptoms of cannabis use disorder include:   Use of cannabis in larger amounts or over a longer period than intended.   Unsuccessful attempts to cut down or control cannabis use.   A lot of time spent obtaining, using, or recovering from the effects of cannabis.   A strong desire or urge to use cannabis (cravings).   Continued use of cannabis in spite of problems at work,  school, or home because of use.   Continued use of cannabis in spite of relationship problems because of use.  Giving up or cutting down on important life activities because of cannabis use.  Use of cannabis over and over even in situations when it is physically hazardous, such as when driving a car.   Continued use of cannabis in spite of a physical problem that is likely related to use. Physical problems can include:  Chronic cough.  Bronchitis.  Emphysema.  Throat and lung cancer.  Continued use of cannabis in spite of a mental problem that is likely related to use. Mental problems can include:  Psychosis.  Anxiety.  Difficulty sleeping.  Need to use more and more cannabis to get the same effect, or lessened effect over time with use of the same amount (tolerance).  Having withdrawal symptoms when cannabis use is stopped, or using cannabis to reduce or avoid withdrawal symptoms. Withdrawal symptoms include:  Irritability or anger.  Anxiety or restlessness.  Difficulty sleeping.  Loss of appetite or weight.  Aches and pains.  Shakiness.  Sweating.  Chills. DIAGNOSIS Cannabis use disorder is diagnosed by your health care provider. You may be asked questions about your cannabis use and how it affects your life. A physical exam may be done. A drug screen may be done. You may be referred to a mental health professional. The diagnosis of cannabis use disorder requires at least two symptoms within 12 months. The type of cannabis use disorder you have depends on the number of symptoms you have. The type may be:  Mild. Two or three signs and symptoms.   Moderate. Four or   five signs and symptoms.   Severe. Six or more signs and symptoms.  TREATMENT Treatment is usually provided by mental health professionals with training in substance use disorders. The following options are available:  Counseling or talk therapy. Talk therapy addresses the reasons you use  cannabis. It also addresses ways to keep you from using again. The goals of talk therapy include:  Identifying and avoiding triggers for use.  Learning how to handle cravings.  Replacing use with healthy activities.  Support groups. Support groups provide emotional support, advice, and guidance.  Medicine. Medicine is used to treat mental health issues that trigger cannabis use or that result from it. HOME CARE INSTRUCTIONS  Take medicines only as directed by your health care provider.  Check with your health care provider before starting any new medicines.  Keep all follow-up visits as directed by your health care provider. SEEK MEDICAL CARE IF:  You are not able to take your medicines as directed.  Your symptoms get worse. SEEK IMMEDIATE MEDICAL CARE IF: You have serious thoughts about hurting yourself or others. FOR MORE INFORMATION  National Institute on Drug Abuse: www.drugabuse.gov  Substance Abuse and Mental Health Services Administration: www.samhsa.gov   This information is not intended to replace advice given to you by your health care provider. Make sure you discuss any questions you have with your health care provider.   Document Released: 09/20/2000 Document Revised: 10/14/2014 Document Reviewed: 10/06/2013 Elsevier Interactive Patient Education 2016 Elsevier Inc.  

## 2016-01-27 NOTE — ED Notes (Signed)
Pt given d/c instructions as per chart. Verbalizes understanding. No questions. Rx x 2. 

## 2016-01-28 LAB — TSH: TSH: 3.601 u[IU]/mL (ref 0.350–4.500)

## 2016-01-29 LAB — T4: T4, Total: 7.2 ug/dL (ref 4.5–12.0)

## 2016-01-30 ENCOUNTER — Ambulatory Visit (INDEPENDENT_AMBULATORY_CARE_PROVIDER_SITE_OTHER): Payer: 59 | Admitting: Physician Assistant

## 2016-01-30 ENCOUNTER — Ambulatory Visit: Payer: Self-pay | Admitting: Internal Medicine

## 2016-01-30 ENCOUNTER — Encounter: Payer: Self-pay | Admitting: Physician Assistant

## 2016-01-30 ENCOUNTER — Ambulatory Visit: Payer: 59 | Admitting: Physician Assistant

## 2016-01-30 VITALS — BP 116/82 | HR 90 | Temp 98.9°F | Ht 62.0 in | Wt 139.0 lb

## 2016-01-30 DIAGNOSIS — E039 Hypothyroidism, unspecified: Secondary | ICD-10-CM | POA: Diagnosis not present

## 2016-01-30 DIAGNOSIS — F12988 Cannabis use, unspecified with other cannabis-induced disorder: Secondary | ICD-10-CM

## 2016-01-30 DIAGNOSIS — R1013 Epigastric pain: Secondary | ICD-10-CM | POA: Diagnosis not present

## 2016-01-30 DIAGNOSIS — R112 Nausea with vomiting, unspecified: Secondary | ICD-10-CM

## 2016-01-30 LAB — CBC
HEMATOCRIT: 44.1 % (ref 39.0–52.0)
HEMOGLOBIN: 15.4 g/dL (ref 13.0–17.0)
MCHC: 35 g/dL (ref 30.0–36.0)
MCV: 88.5 fl (ref 78.0–100.0)
PLATELETS: 220 10*3/uL (ref 150.0–400.0)
RBC: 4.98 Mil/uL (ref 4.22–5.81)
RDW: 12.7 % (ref 11.5–14.6)
WBC: 7.4 10*3/uL (ref 4.5–10.5)

## 2016-01-30 LAB — COMPREHENSIVE METABOLIC PANEL
ALT: 19 U/L (ref 0–53)
AST: 17 U/L (ref 0–37)
Albumin: 4.6 g/dL (ref 3.5–5.2)
Alkaline Phosphatase: 42 U/L (ref 39–117)
BUN: 14 mg/dL (ref 6–23)
CHLORIDE: 102 meq/L (ref 96–112)
CO2: 31 meq/L (ref 19–32)
Calcium: 10.6 mg/dL — ABNORMAL HIGH (ref 8.4–10.5)
Creatinine, Ser: 0.88 mg/dL (ref 0.40–1.50)
GFR: 116.65 mL/min (ref >60.00)
GLUCOSE: 104 mg/dL — AB (ref 70–99)
POTASSIUM: 4 meq/L (ref 3.5–5.1)
Sodium: 139 mEq/L (ref 135–145)
Total Bilirubin: 0.3 mg/dL (ref 0.2–1.2)
Total Protein: 7.2 g/dL (ref 6.0–8.3)

## 2016-01-30 MED ORDER — SYNTHROID 25 MCG PO TABS: 25.0000 ug | ORAL_TABLET | Freq: Every day | ORAL | Status: DC

## 2016-01-30 MED ORDER — ONDANSETRON HCL 4 MG PO TABS: 4.0000 mg | ORAL_TABLET | Freq: Three times a day (TID) | ORAL | Status: DC | PRN

## 2016-01-30 MED ORDER — OMEPRAZOLE 20 MG PO CPDR: 20.0000 mg | DELAYED_RELEASE_CAPSULE | Freq: Every day | ORAL | Status: DC

## 2016-01-30 NOTE — Patient Instructions (Signed)
Please go to the lab. I will call with your results. Use the Zofran as directed for nausea. Use the prilosec (omeprazole) daily as directed to reduce acid in the stomach. Start a daily probiotic.  No alcohol or marijuana use.   Stay well hydrated. Eat a bland diet.  If symptoms not improving we will need to proceed with imaging.

## 2016-01-30 NOTE — Progress Notes (Signed)
Patient presents to clinic today to transfer care. Patient is presenting as an ER follow-up of abdominal pain and nausea. Patient presented to ER on 01/27/16 with symptoms. Evaluation was unremarkable. History and symptoms were consistent with Cannabinoid Hyperemesis Syndrome. Patient was instructed to stop use of marijuana and follow-up with PCP. Patient does endorses prior regular use of marijuana.  Since discharge, patient endorses that pain is still present, mainly in epigastric region and LUQ. Denies repeat use of cannabis. Denies alcohol consumption or NSAID use. Does endorse nausea that is intermittent but much improved. Denies change to bowel or bladder habits. Appetite is returning. Pain is relieved with warm showers and tylenol.   Patient also requesting refill of his Synthroid. TSH was obtained during ER visit and was within normal limits.  Past Medical History  Diagnosis Date  . Physical growth delay   . ADHD (attention deficit hyperactivity disorder)   . Goiter   . Hypothyroidism, acquired, autoimmune   . Thyroiditis, autoimmune   . Isosexual precocity   . Fatigue   . Iron excess     No current outpatient prescriptions on file prior to visit.   No current facility-administered medications on file prior to visit.    No Known Allergies  Family History  Problem Relation Age of Onset  . Thyroid disease Maternal Grandmother   . Heart disease Maternal Grandmother   . Diabetes Neg Hx   . Thyroid disease Paternal Aunt   . Heart disease Paternal Grandfather     Social History   Social History  . Marital Status: Single    Spouse Name: N/A  . Number of Children: N/A  . Years of Education: N/A   Social History Main Topics  . Smoking status: Former Smoker -- 0.00 packs/day    Start date: 01/11/2012  . Smokeless tobacco: Never Used  . Alcohol Use: No  . Drug Use: Yes    Special: "Crack" cocaine, Other-see comments, Marijuana  . Sexual Activity: Not Asked   Other  Topics Concern  . None   Social History Narrative   Lives with foster parents. Mom still has guardianship.  [employer].    Review of Systems - See HPI.  All other ROS are negative.  BP 116/82 mmHg  Pulse 90  Temp(Src) 98.9 F (37.2 C) (Oral)  Ht '5\' 2"'  (1.575 m)  Wt 139 lb (63.05 kg)  BMI 25.42 kg/m2  SpO2 98%  Physical Exam  Constitutional: He is oriented to person, place, and time and well-developed, well-nourished, and in no distress.  HENT:  Head: Normocephalic and atraumatic.  Eyes: Conjunctivae are normal.  Neck: Neck supple. No thyromegaly present.  Cardiovascular: Normal rate, regular rhythm, normal heart sounds and intact distal pulses.   Pulmonary/Chest: Effort normal and breath sounds normal. No respiratory distress. He has no wheezes. He has no rales. He exhibits no tenderness.  Abdominal: Soft. Bowel sounds are normal. He exhibits no distension and no mass. There is no tenderness. There is no rebound and no guarding.  Neurological: He is alert and oriented to person, place, and time.  Skin: Skin is warm and dry. No rash noted.  Psychiatric: Affect normal.  Vitals reviewed.   Recent Results (from the past 2160 hour(s))  TSH     Status: None   Collection Time: 01/27/16  9:05 PM  Result Value Ref Range   TSH 3.601 0.350 - 4.500 uIU/mL    Comment: Performed at [facility] ([place])  T4  Status: None   Collection Time: 01/27/16  9:05 PM  Result Value Ref Range   T4, Total 7.2 4.5 - 12.0 ug/dL    Comment: (NOTE) Performed At: [facility] [place], [place] [id number] Lindon Romp MD SE:[id number]   Lipase, blood     Status: None   Collection Time: 01/27/16  9:05 PM  Result Value Ref Range   Lipase 14 11 - 51 U/L  Basic metabolic panel     Status: Abnormal   Collection Time: 01/27/16  9:05 PM  Result Value Ref Range   Sodium 139 135 - 145 mmol/L   Potassium 3.6 3.5 - 5.1 mmol/L   Chloride 105 101 - 111 mmol/L   CO2 23  22 - 32 mmol/L   Glucose, Bld 100 (H) 65 - 99 mg/dL   BUN 12 6 - 20 mg/dL   Creatinine, Ser 0.88 0.61 - 1.24 mg/dL   Calcium 9.6 8.9 - 10.3 mg/dL   GFR calc non Af Amer >60 >60 mL/min   GFR calc Af Amer >60 >60 mL/min    Comment: (NOTE) The eGFR has been calculated using the CKD EPI equation. This calculation has not been validated in all clinical situations. eGFR's persistently <60 mL/min signify possible Chronic Kidney Disease.    Anion gap 11 5 - 15  CBC with Differential/Platelet     Status: Abnormal   Collection Time: 01/27/16  9:05 PM  Result Value Ref Range   WBC 7.1 4.0 - 10.5 K/uL   RBC 4.88 4.22 - 5.81 MIL/uL   Hemoglobin 15.4 13.0 - 17.0 g/dL   HCT 42.0 39.0 - 52.0 %   MCV 86.1 78.0 - 100.0 fL   MCH 31.6 26.0 - 34.0 pg   MCHC 36.7 (H) 30.0 - 36.0 g/dL   RDW 12.1 11.5 - 15.5 %   Platelets 210 150 - 400 K/uL   Neutrophils Relative % 60 %   Neutro Abs 4.3 1.7 - 7.7 K/uL   Lymphocytes Relative 29 %   Lymphs Abs 2.1 0.7 - 4.0 K/uL   Monocytes Relative 10 %   Monocytes Absolute 0.7 0.1 - 1.0 K/uL   Eosinophils Relative 1 %   Eosinophils Absolute 0.1 0.0 - 0.7 K/uL   Basophils Relative 0 %   Basophils Absolute 0.0 0.0 - 0.1 K/uL    Assessment/Plan: Thyroid activity decreased Medications refilled.   Cannabinoid hyperemesis syndrome (HCC) History, Symptoms and benign exam do fit this diagnosis. Rx Zofran for nausea. Dietary measures reviewed. Rx Omeprazole to cut down on any acid production causing irritation in stomach lining.  Will check labs today to include CBC, CMP and H. Pylori today. Close FU scheduled. If all negative and symptoms not continuing to resolve, will need GI assessment. Patient instructed to go to ER if anything worsens.

## 2016-01-30 NOTE — Progress Notes (Signed)
Pre visit review using our clinic tool,if applicable. No additional management support is needed unless otherwise documented below in the visit note.  

## 2016-01-31 ENCOUNTER — Encounter (HOSPITAL_COMMUNITY): Payer: Self-pay | Admitting: Nurse Practitioner

## 2016-01-31 ENCOUNTER — Emergency Department (HOSPITAL_COMMUNITY)
Admission: EM | Admit: 2016-01-31 | Discharge: 2016-01-31 | Disposition: A | Discharge status: Home / Self Care | Payer: 59 | Attending: Emergency Medicine | Admitting: Emergency Medicine

## 2016-01-31 ENCOUNTER — Emergency Department (HOSPITAL_COMMUNITY): Payer: 59

## 2016-01-31 DIAGNOSIS — F12988 Cannabis use, unspecified with other cannabis-induced disorder: Principal | ICD-10-CM

## 2016-01-31 DIAGNOSIS — R1032 Left lower quadrant pain: Secondary | ICD-10-CM | POA: Insufficient documentation

## 2016-01-31 DIAGNOSIS — E039 Hypothyroidism, unspecified: Secondary | ICD-10-CM | POA: Insufficient documentation

## 2016-01-31 DIAGNOSIS — F1290 Cannabis use, unspecified, uncomplicated: Secondary | ICD-10-CM | POA: Insufficient documentation

## 2016-01-31 DIAGNOSIS — Z87891 Personal history of nicotine dependence: Secondary | ICD-10-CM | POA: Diagnosis not present

## 2016-01-31 DIAGNOSIS — Z8659 Personal history of other mental and behavioral disorders: Secondary | ICD-10-CM | POA: Diagnosis not present

## 2016-01-31 DIAGNOSIS — D696 Thrombocytopenia, unspecified: Secondary | ICD-10-CM | POA: Insufficient documentation

## 2016-01-31 LAB — COMPREHENSIVE METABOLIC PANEL WITH GFR
ALT: 20 U/L (ref 17–63)
AST: 28 U/L (ref 15–41)
Albumin: 4.7 g/dL (ref 3.5–5.0)
Alkaline Phosphatase: 42 U/L (ref 38–126)
Anion gap: 8 (ref 5–15)
BUN: 13 mg/dL (ref 6–20)
CO2: 26 mmol/L (ref 22–32)
Calcium: 10 mg/dL (ref 8.9–10.3)
Chloride: 104 mmol/L (ref 101–111)
Creatinine, Ser: 0.84 mg/dL (ref 0.61–1.24)
GFR calc Af Amer: >60 mL/min
GFR calc non Af Amer: >60 mL/min
Glucose, Bld: 93 mg/dL (ref 65–99)
Potassium: 5 mmol/L (ref 3.5–5.1)
Sodium: 138 mmol/L (ref 135–145)
Total Bilirubin: 0.7 mg/dL (ref 0.3–1.2)
Total Protein: 7.3 g/dL (ref 6.5–8.1)

## 2016-01-31 LAB — CBC WITH DIFFERENTIAL/PLATELET
Basophils Absolute: 0 10*3/uL (ref 0.0–0.1)
Basophils Relative: 0 %
EOS ABS: 0 10*3/uL (ref 0.0–0.7)
Eosinophils Relative: 1 %
HEMATOCRIT: 42.8 % (ref 39.0–52.0)
HEMOGLOBIN: 15.6 g/dL (ref 13.0–17.0)
LYMPHS ABS: 1.2 10*3/uL (ref 0.7–4.0)
LYMPHS PCT: 29 %
MCH: 31.3 pg (ref 26.0–34.0)
MCHC: 36.4 g/dL — ABNORMAL HIGH (ref 30.0–36.0)
MCV: 85.8 fL (ref 78.0–100.0)
MONOS PCT: 11 %
Monocytes Absolute: 0.5 10*3/uL (ref 0.1–1.0)
NEUTROS ABS: 2.6 10*3/uL (ref 1.7–7.7)
NEUTROS PCT: 60 %
Platelets: 24 10*3/uL — CL (ref 150–400)
RBC: 4.99 MIL/uL (ref 4.22–5.81)
RDW: 12.4 % (ref 11.5–15.5)
WBC: 4.3 10*3/uL (ref 4.0–10.5)

## 2016-01-31 LAB — H. PYLORI ANTIBODY, IGG: H Pylori IgG: NEGATIVE

## 2016-01-31 LAB — URINALYSIS, ROUTINE W REFLEX MICROSCOPIC
BILIRUBIN URINE: NEGATIVE
Glucose, UA: NEGATIVE mg/dL
Hgb urine dipstick: NEGATIVE
KETONES UR: NEGATIVE mg/dL
Leukocytes, UA: NEGATIVE
NITRITE: NEGATIVE
Protein, ur: NEGATIVE mg/dL
Specific Gravity, Urine: 1.025 (ref 1.005–1.030)
pH: 7.5 (ref 5.0–8.0)

## 2016-01-31 LAB — LIPASE, BLOOD: LIPASE: 34 U/L (ref 11–51)

## 2016-01-31 MED ORDER — FENTANYL CITRATE (PF) 100 MCG/2ML IJ SOLN
50.0000 ug | Freq: Once | INTRAMUSCULAR | Status: AC
  Administered 2016-01-31: 50 ug via INTRAVENOUS
  Filled 2016-01-31: qty 2

## 2016-01-31 MED ORDER — HYDROCODONE-ACETAMINOPHEN 5-325 MG PO TABS: 1.0000 | ORAL_TABLET | ORAL | Status: DC | PRN

## 2016-01-31 MED ORDER — SODIUM CHLORIDE 0.9 % IV BOLUS (SEPSIS)
1000.0000 mL | Freq: Once | INTRAVENOUS | Status: AC
  Administered 2016-01-31: 1000 mL via INTRAVENOUS

## 2016-01-31 MED ORDER — DIATRIZOATE MEGLUMINE & SODIUM 66-10 % PO SOLN
30.0000 mL | Freq: Once | ORAL | Status: AC
  Administered 2016-01-31: 30 mL via ORAL

## 2016-01-31 MED ORDER — NAPROXEN 500 MG PO TABS: 500.0000 mg | ORAL_TABLET | Freq: Two times a day (BID) | ORAL | Status: DC

## 2016-01-31 MED ORDER — ONDANSETRON HCL 4 MG/2ML IJ SOLN
4.0000 mg | Freq: Once | INTRAMUSCULAR | Status: AC
  Administered 2016-01-31: 4 mg via INTRAVENOUS
  Filled 2016-01-31: qty 2

## 2016-01-31 MED ORDER — PROMETHAZINE HCL 25 MG PO TABS: 25.0000 mg | ORAL_TABLET | Freq: Four times a day (QID) | ORAL | Status: DC | PRN

## 2016-01-31 MED ORDER — IOPAMIDOL (ISOVUE-300) INJECTION 61%
100.0000 mL | Freq: Once | INTRAVENOUS | Status: AC | PRN
  Administered 2016-01-31: 100 mL via INTRAVENOUS

## 2016-01-31 NOTE — Discharge Instructions (Signed)
CT scan of your abdomen showed no acute problems. Your platelet count was low. This will need to be rechecked on Thursday or Friday. It can be checked at your primary care doctor's office preferably.  Medication for pain.

## 2016-01-31 NOTE — ED Notes (Signed)
Bed: RU04WA22 Expected date:  Expected time:  Means of arrival:  Comments: EMS- 19yo M, n/v/d

## 2016-01-31 NOTE — Assessment & Plan Note (Signed)
History, Symptoms and benign exam do fit this diagnosis. Rx Zofran for nausea. Dietary measures reviewed. Rx Omeprazole to cut down on any acid production causing irritation in stomach lining.  Will check labs today to include CBC, CMP and H. Pylori today. Close FU scheduled. If all negative and symptoms not continuing to resolve, will need GI assessment. Patient instructed to go to ER if anything worsens.

## 2016-01-31 NOTE — Assessment & Plan Note (Signed)
Medications refilled

## 2016-01-31 NOTE — ED Notes (Signed)
Patient presents today for complaints of abdominal pain, vomiting, unable to keep food down. He was seen by his PCP who was concerned that he may have an ulcer. He was started on a medication to reduce the acid in his stomach but is unsure of the name of the medication. He was also given something for nausea but says he has been unable to keep it down. He says that he called his PCP this morning for continued complaints who told him to come to the Emergency Room. He states that PCP was going to send him for imaging next week if symptoms continued.

## 2016-01-31 NOTE — ED Provider Notes (Addendum)
CSN: [id number]     Arrival date & time 01/31/16  0806 History   First MD Initiated Contact with Patient 01/31/16 [id number]     Chief Complaint  Patient presents with  . Abdominal Pain     (Consider location/radiation/quality/duration/timing/severity/associated sxs/prior Treatment) HPI.Marland Kitchen.Marland Kitchen.Marland Kitchen.Left lower quadrant abdominal pain for 2 weeks with associated nausea and decreased oral intake. He has a history of hypothyroidism and takes Synthroid. His levels have been normal. He reports a 10 pound weight loss recently. No previous abdominal surgery. Normal bowel movements. Normal urinary output. No fever, sweats, chills, vomiting, jaundice, bloody or mucousy stools.  No abnormal bleeding or bruising  Past Medical History  Diagnosis Date  . Physical growth delay   . ADHD (attention deficit hyperactivity disorder)   . Goiter   . Hypothyroidism, acquired, autoimmune   . Thyroiditis, autoimmune   . Isosexual precocity   . Fatigue   . Iron excess    Past Surgical History  Procedure Laterality Date  . Chalazion excision    . Frenulectomy, lingual    . Tonsillectomy and adenoidectomy     Family History  Problem Relation Age of Onset  . Thyroid disease Maternal Grandmother   . Heart disease Maternal Grandmother   . Diabetes Neg Hx   . Thyroid disease Paternal Aunt   . Heart disease Paternal Grandfather    Social History  Substance Use Topics  . Smoking status: Former Smoker -- 0.00 packs/day    Start date: 01/11/2012  . Smokeless tobacco: Never Used  . Alcohol Use: No    Review of Systems  All other systems reviewed and are negative.     Allergies  Review of patient's allergies indicates no known allergies.  Home Medications   Prior to Admission medications   Medication Sig Start Date End Date Taking? Authorizing Provider  omeprazole (PRILOSEC) 20 MG capsule Take 1 capsule (20 mg total) by mouth daily. 01/30/16  Yes Waldon MerlWilliam C Martin, PA-C  ondansetron (ZOFRAN) 4 MG tablet Take 1  tablet (4 mg total) by mouth every 8 (eight) hours as needed for nausea or vomiting. 01/30/16  Yes Waldon MerlWilliam C Martin, PA-C  SYNTHROID 25 MCG tablet Take 1 tablet (25 mcg total) by mouth daily before breakfast. 01/30/16  Yes Waldon MerlWilliam C Martin, PA-C  naproxen (NAPROSYN) 500 MG tablet Take 1 tablet (500 mg total) by mouth 2 (two) times daily. 01/31/16   Donnetta HutchingBrian Janiece Scovill, MD   BP 122/60 mmHg  Pulse 81  Resp 15  SpO2 100% Physical Exam  Constitutional: He is oriented to person, place, and time. He appears well-developed and well-nourished.  HENT:  Head: Normocephalic and atraumatic.  Eyes: Conjunctivae and EOM are normal. Pupils are equal, round, and reactive to light.  Neck: Normal range of motion. Neck supple.  Cardiovascular: Normal rate and regular rhythm.   Pulmonary/Chest: Effort normal and breath sounds normal.  Abdominal: Soft. Bowel sounds are normal.  Minimal left lower quadrant tenderness  Musculoskeletal: Normal range of motion.  Neurological: He is alert and oriented to person, place, and time.  Skin: Skin is warm and dry.  Psychiatric: He has a normal mood and affect. His behavior is normal.  Nursing note and vitals reviewed.   ED Course  Procedures (including critical care time) Labs Review Labs Reviewed  CBC WITH DIFFERENTIAL/PLATELET - Abnormal; Notable for the following:    MCHC 36.4 (*)    Platelets 24 (*)    All other components within normal limits  URINALYSIS, ROUTINE W REFLEX MICROSCOPIC (NOT  AT [facility]) - Abnormal; Notable for the following:    APPearance CLOUDY (*)    All other components within normal limits  LIPASE, BLOOD  COMPREHENSIVE METABOLIC PANEL    Imaging Review Ct Abdomen Pelvis W Contrast  01/31/2016  CLINICAL DATA:  Vomiting. Left lower quadrant pain. Symptoms for 2 weeks. EXAM: CT ABDOMEN AND PELVIS WITH CONTRAST TECHNIQUE: Multidetector CT imaging of the abdomen and pelvis was performed using the standard protocol following bolus administration of  intravenous contrast. CONTRAST:  ISOVUE-300 IOPAMIDOL (ISOVUE-300) INJECTION 61% COMPARISON:  12/06/2013 FINDINGS: Lower chest: Lung bases are clear. No effusions. Heart is normal size. Hepatobiliary: No focal hepatic abnormality. Gallbladder unremarkable. Pancreas: Normal appearance Spleen: Normal appearance Adrenals/Urinary Tract: No adrenal abnormality. No focal renal abnormality. No stones or hydronephrosis. Urinary bladder is unremarkable. Stomach/Bowel: Stomach, large and small bowel grossly unremarkable. Appendix is normal. Vascular/Lymphatic: No evidence of aneurysm or adenopathy. Reproductive: No visible abnormality. Other: No free fluid or free air. Musculoskeletal: No acute bony abnormality or focal bone lesion. IMPRESSION: Normal study. Electronically Signed   By: Charlett Nose M.D.   On: 01/31/2016 11:04   I have personally reviewed and evaluated these images and lab results as part of my medical decision-making.   EKG Interpretation None      MDM   Final diagnoses:  LLQ pain  Thrombocytopenia (HCC)    Patient is in no acute distress. CT abdomen pelvis negative. Platelet count noted to be 24.  Patient is not bleeding or bruising excessively.  Will Rx Norco and recheck platelet count in 1-2 days. This was discussed with the patient.    Donnetta Hutching, MD 01/31/16 1440  Donnetta Hutching, MD 01/31/16 [phone]

## 2016-02-01 ENCOUNTER — Encounter: Payer: Self-pay | Admitting: Medical

## 2016-02-01 ENCOUNTER — Ambulatory Visit (INDEPENDENT_AMBULATORY_CARE_PROVIDER_SITE_OTHER): Payer: 59 | Admitting: Medical

## 2016-02-01 ENCOUNTER — Telehealth: Payer: Self-pay | Admitting: Physician Assistant

## 2016-02-01 ENCOUNTER — Telehealth: Payer: Self-pay | Admitting: Behavioral Health

## 2016-02-01 ENCOUNTER — Telehealth: Payer: Self-pay | Admitting: Medical

## 2016-02-01 VITALS — BP 110/74 | HR 88 | Temp 98.6°F | Ht 62.0 in | Wt 142.0 lb

## 2016-02-01 DIAGNOSIS — D696 Thrombocytopenia, unspecified: Secondary | ICD-10-CM

## 2016-02-01 DIAGNOSIS — R112 Nausea with vomiting, unspecified: Secondary | ICD-10-CM

## 2016-02-01 DIAGNOSIS — R1013 Epigastric pain: Secondary | ICD-10-CM

## 2016-02-01 NOTE — Telephone Encounter (Signed)
Keith Lawrence pt cbc tomorrow am needs to be done stat and have pick asap. Pt has low platlet history.

## 2016-02-01 NOTE — Telephone Encounter (Signed)
Patient Name: Keith Lawrence DOB: 1996/04/20 Initial Comment Caller asking what low platelets means, no energy, feels like going to pass out Nurse Assessment Nurse: Yetta BarreJones, RN, Miranda Date/Time (Eastern Time): 02/01/2016 10:54:20 AM Confirm and document reason for call. If symptomatic, describe symptoms. You must click the next button to save text entered. ---Caller states he was seen in ED yesterday and told he has low platelets and it was serious. He has been vomiting. Has the patient traveled out of the country within the last 30 days? ---Not Applicable Does the patient have any new or worsening symptoms? ---Yes Will a triage be completed? ---Yes Related visit to physician within the last 2 weeks? ---Yes Does the PT have any chronic conditions? (i.e. diabetes, asthma, etc.) ---No Is this a behavioral health or substance abuse call? ---No Guidelines Guideline Title Affirmed Question Affirmed Notes Weakness (Generalized) and Fatigue [1] MODERATE weakness (i.e., interferes with work, school, normal activities) AND [2] persists > 3 days Final Disposition User See Physician within 24 Hours Jones, Charity fundraiserN, Miranda Referrals REFERRED TO PCP OFFICE Disagree/Comply: Danella Maiersomply

## 2016-02-01 NOTE — Progress Notes (Signed)
Pre visit review using our clinic review tool, if applicable. No additional management support is needed unless otherwise documented below in the visit note. 

## 2016-02-01 NOTE — Progress Notes (Signed)
Subjective:    Patient ID: Keith Lawrence, male    DOB: Jun 07, 1996, 19 y.o.   MRN: [id number]  HPI  Pt was seen in the ED yesterday. HP yesteray I.Marland KitchenMarland KitchenMarland KitchenLeft lower quadrant abdominal pain for 2 weeks with associated nausea and decreased oral intake. He has a history of hypothyroidism and takes Synthroid. His levels have been normal. He reports a 10 pound weight loss recently. No previous abdominal surgery. Normal bowel movements. Normal urinary output. No fever, sweats, chills, vomiting, jaundice, bloody or mucousy stools. No abnormal bleeding or bruising.  Pt has low platlets yesterday. Pt was told to be seen here for follow up here. So he was schedule but given last appointment on thursay(late day). Pt cmp was normal. His lipase was normal.   Pt states yesterday he was able to keep food down. Pt states he had vomiting episodes about 3-4 times for 2 weeks.   Pt ct of abdomen yesterday was negative. Pt was given some phenergan and omprazole. He still has zofran.   Pt able to keep his food down. Pt describes his pain as hunger pains.  No nose bleeds. No gum bleeding. No dark stool of black stools. No excessive bruising   Review of Systems  Constitutional: Negative for fever, chills and fatigue.  Respiratory: Negative for cough, shortness of breath and wheezing.   Cardiovascular: Negative for chest pain and palpitations.  Gastrointestinal: Positive for vomiting and abdominal pain. Negative for nausea and rectal pain.       None now but some past 2 weeks.  No vomiting presently.  Musculoskeletal: Negative for back pain.  Skin: Negative for rash.  Hematological: Negative for adenopathy.  Psychiatric/Behavioral: Negative for behavioral problems, confusion, sleep disturbance and dysphoric mood.   Past Medical History  Diagnosis Date  . Physical growth delay   . ADHD (attention deficit hyperactivity disorder)   . Goiter   . Hypothyroidism, acquired, autoimmune   . Thyroiditis,  autoimmune   . Isosexual precocity   . Fatigue   . Iron excess      Social History   Social History  . Marital Status: Single    Spouse Name: N/A  . Number of Children: N/A  . Years of Education: N/A   Occupational History  . Not on file.   Social History Main Topics  . Smoking status: Former Smoker -- 0.00 packs/day    Start date: 01/11/2012  . Smokeless tobacco: Never Used  . Alcohol Use: No  . Drug Use: Yes    Special: "Crack" cocaine, Other-see comments, Marijuana  . Sexual Activity: Not on file   Other Topics Concern  . Not on file   Social History Narrative   Lives with foster parents. Mom still has guardianship.  [employer].     Past Surgical History  Procedure Laterality Date  . Chalazion excision    . Frenulectomy, lingual    . Tonsillectomy and adenoidectomy      Family History  Problem Relation Age of Onset  . Thyroid disease Maternal Grandmother   . Heart disease Maternal Grandmother   . Diabetes Neg Hx   . Thyroid disease Paternal Aunt   . Heart disease Paternal Grandfather     No Known Allergies  Current Outpatient Prescriptions on File Prior to Visit  Medication Sig Dispense Refill  . HYDROcodone-acetaminophen (NORCO/VICODIN) 5-325 MG tablet Take 1-2 tablets by mouth every 4 (four) hours as needed. 15 tablet 0  . omeprazole (PRILOSEC) 20 MG capsule Take  1 capsule (20 mg total) by mouth daily. 30 capsule 3  . ondansetron (ZOFRAN) 4 MG tablet Take 1 tablet (4 mg total) by mouth every 8 (eight) hours as needed for nausea or vomiting. 20 tablet 0  . promethazine (PHENERGAN) 25 MG tablet Take 1 tablet (25 mg total) by mouth every 6 (six) hours as needed for nausea or vomiting. 10 tablet 0  . SYNTHROID 25 MCG tablet Take 1 tablet (25 mcg total) by mouth daily before breakfast. 30 tablet 5   No current facility-administered medications on file prior to visit.    BP 110/74 mmHg  Pulse 88  Temp(Src) 98.6 F (37 C) (Oral)  Ht 5\' 2"   (1.575 m)  Wt 142 lb (64.411 kg)  BMI 25.97 kg/m2  SpO2 97%       Objective:   Physical Exam  General Appearance- Not in acute distress.  HEENT Eyes- Scleraeral/Conjuntiva-bilat- Not Yellow. Mouth & Throat- Normal.  Chest and Lung Exam Auscultation: Breath sounds:-Normal. Adventitious sounds:- No Adventitious sounds.  Cardiovascular Auscultation:Rythm - Regular. Heart Sounds -Normal heart sounds.  Abdomen Inspection:-Inspection Normal.  Palpation/Perucssion: Palpation and Percussion of the abdomen reveal- Non Tender, No Rebound tenderness, No rigidity(Guarding) and No Palpable abdominal masses.  Liver:-Normal.  Spleen:- Normal.   Back- no cva tenderness.      Assessment & Plan:  For your recent abdomen pain and vomiting continue the prilosec and phenergan. When you run out of phenergan use zofran.  I am going to refer you to GI for evaluation.   For your low platelets I need you to come back tomorrow at 7 am for stat cbc. This is extremely important as we need to know what your platelets are before the weekend.   I am also going to go ahead and get stat referral with hematologist.   If you have nose bleeds tonight, gum bleeding, any bleeding with bowel movements, new bruising, fatigue, dizziness, or recurrent abdomen pain or vomiting then ED evaluation.  Follow up tomorrow am. It may beneficial tomorrow after lab drawn [facility] since if platlets still low you may need to be seen in ED or by specialist.  50% of time spent counseling , educating pt and grandad on condition and importance of getting blood work tomorrow am. As well as educating on criteria that would indicate ED evaluation tonight. Total time with pt 50 minutes.

## 2016-02-01 NOTE — Telephone Encounter (Signed)
Will you look at stat referral.

## 2016-02-01 NOTE — Telephone Encounter (Signed)
[facility] note received via fax  Call Date: 01/31/16 Time: 7:25 AM   Caller: Coralee NorthMatthew Windholz (Self) Return number: [phone]  Nurse: Bernerd PhoSarah Carr, RN  Chief Complaint: Vomiting  Reason for call: Caller states is vomiting, weak  Related visit to physician within the last 2 weeks: Yes  Guideline: Vomiting  Disposition: 911 Outcome Documentation; reason: caller states he got a hold of EMS and they are currently on the way.  Patient was seen at the ED on 01/31/16.

## 2016-02-01 NOTE — Telephone Encounter (Signed)
Appt noted for today (02/01/16) with Esperanza RichtersEdward Saguier, PA-C.

## 2016-02-01 NOTE — Patient Instructions (Addendum)
For your recent abdomen pain and vomiting continue the prilosec and phenergan. When you run out of phenergan use zofran.  I am going to refer you to GI for evaluation.   For your low platelets I need you to come back tomorrow at 7 am for stat cbc. This is extremely important as we need to know what your platelets are before the weekend.   I am also going to go ahead and get stat referral with hematologist.   If you have nose bleeds tonight, gum bleeding, any bleeding with bowel movements, new bruising, fatigue, dizziness, or recurrent abdomen pain or vomiting then ED evaluation.  Follow up tomorrow am. It may beneficial tomorrow after [facility] since if platlets still low you may need to be seen in ED or by specialist.  Above was the plan but after talking with pt but he not sure if he will have ride tomorrow. So I advised go to ED now.  Later talked with grandad and he assured me he could get pt by here at 7 am.  [phone]. Mom phone number.  [phone]. Grandad cell.

## 2016-02-02 ENCOUNTER — Encounter (HOSPITAL_COMMUNITY): Payer: Self-pay | Admitting: *Deleted

## 2016-02-02 ENCOUNTER — Telehealth: Payer: Self-pay | Admitting: Physician Assistant

## 2016-02-02 ENCOUNTER — Ambulatory Visit (INDEPENDENT_AMBULATORY_CARE_PROVIDER_SITE_OTHER): Payer: 59 | Admitting: Gastroenterology

## 2016-02-02 ENCOUNTER — Encounter: Payer: Self-pay | Admitting: Gastroenterology

## 2016-02-02 ENCOUNTER — Telehealth: Payer: Self-pay | Admitting: Gastroenterology

## 2016-02-02 ENCOUNTER — Ambulatory Visit: Payer: 59 | Admitting: Physician Assistant

## 2016-02-02 ENCOUNTER — Other Ambulatory Visit (INDEPENDENT_AMBULATORY_CARE_PROVIDER_SITE_OTHER): Payer: 59

## 2016-02-02 ENCOUNTER — Emergency Department (HOSPITAL_COMMUNITY)
Admission: EM | Admit: 2016-02-02 | Discharge: 2016-02-02 | Disposition: A | Discharge status: Home / Self Care | Payer: 59 | Attending: Emergency Medicine | Admitting: Emergency Medicine

## 2016-02-02 VITALS — BP 136/82 | HR 88 | Ht 62.0 in | Wt 140.0 lb

## 2016-02-02 DIAGNOSIS — E039 Hypothyroidism, unspecified: Secondary | ICD-10-CM | POA: Diagnosis not present

## 2016-02-02 DIAGNOSIS — Z79899 Other long term (current) drug therapy: Secondary | ICD-10-CM | POA: Diagnosis not present

## 2016-02-02 DIAGNOSIS — Z79891 Long term (current) use of opiate analgesic: Secondary | ICD-10-CM | POA: Insufficient documentation

## 2016-02-02 DIAGNOSIS — Z87891 Personal history of nicotine dependence: Secondary | ICD-10-CM | POA: Insufficient documentation

## 2016-02-02 DIAGNOSIS — D696 Thrombocytopenia, unspecified: Secondary | ICD-10-CM

## 2016-02-02 DIAGNOSIS — R112 Nausea with vomiting, unspecified: Secondary | ICD-10-CM

## 2016-02-02 DIAGNOSIS — R1084 Generalized abdominal pain: Secondary | ICD-10-CM | POA: Insufficient documentation

## 2016-02-02 DIAGNOSIS — K920 Hematemesis: Secondary | ICD-10-CM | POA: Insufficient documentation

## 2016-02-02 DIAGNOSIS — K2970 Gastritis, unspecified, without bleeding: Secondary | ICD-10-CM | POA: Diagnosis not present

## 2016-02-02 DIAGNOSIS — R1013 Epigastric pain: Secondary | ICD-10-CM

## 2016-02-02 DIAGNOSIS — R110 Nausea: Secondary | ICD-10-CM | POA: Diagnosis not present

## 2016-02-02 DIAGNOSIS — F909 Attention-deficit hyperactivity disorder, unspecified type: Secondary | ICD-10-CM | POA: Insufficient documentation

## 2016-02-02 LAB — COMPREHENSIVE METABOLIC PANEL
ALK PHOS: 42 U/L (ref 38–126)
ALT: 18 U/L (ref 17–63)
AST: 23 U/L (ref 15–41)
Albumin: 4.7 g/dL (ref 3.5–5.0)
Anion gap: 11 (ref 5–15)
BILIRUBIN TOTAL: 0.5 mg/dL (ref 0.3–1.2)
BUN: 12 mg/dL (ref 6–20)
CO2: 26 mmol/L (ref 22–32)
CREATININE: 0.76 mg/dL (ref 0.61–1.24)
Calcium: 9.5 mg/dL (ref 8.9–10.3)
Chloride: 103 mmol/L (ref 101–111)
GFR calc Af Amer: >60 mL/min (ref >60)
Glucose, Bld: 116 mg/dL — ABNORMAL HIGH (ref 65–99)
Potassium: 4 mmol/L (ref 3.5–5.1)
Sodium: 140 mmol/L (ref 135–145)
TOTAL PROTEIN: 7.2 g/dL (ref 6.5–8.1)

## 2016-02-02 LAB — CBC WITH DIFFERENTIAL/PLATELET
BASOS ABS: 0 10*3/uL (ref 0.0–0.1)
Basophils Relative: 0.4 % (ref 0.0–3.0)
Eosinophils Absolute: 0.1 10*3/uL (ref 0.0–0.7)
Eosinophils Relative: 1.2 % (ref 0.0–5.0)
HEMATOCRIT: 44.5 % (ref 39.0–52.0)
Hemoglobin: 15.4 g/dL (ref 13.0–17.0)
LYMPHS PCT: 27.6 % (ref 12.0–46.0)
Lymphs Abs: 1.7 10*3/uL (ref 0.7–4.0)
MCHC: 34.6 g/dL (ref 30.0–36.0)
MCV: 88.7 fl (ref 78.0–100.0)
MONOS PCT: 9.3 % (ref 3.0–12.0)
Monocytes Absolute: 0.6 10*3/uL (ref 0.1–1.0)
NEUTROS ABS: 3.7 10*3/uL (ref 1.4–7.7)
Neutrophils Relative %: 61.5 % (ref 43.0–77.0)
Platelets: 208 10*3/uL (ref 150.0–400.0)
RBC: 5.01 Mil/uL (ref 4.22–5.81)
RDW: 13.1 % (ref 11.5–14.6)
WBC: 6.1 10*3/uL (ref 4.5–10.5)

## 2016-02-02 MED ORDER — PROMETHAZINE HCL 25 MG RE SUPP: 25.0000 mg | Freq: Four times a day (QID) | RECTAL | Status: DC | PRN

## 2016-02-02 MED ORDER — FAMOTIDINE IN NACL 20-0.9 MG/50ML-% IV SOLN
20.0000 mg | Freq: Once | INTRAVENOUS | Status: AC
  Administered 2016-02-02: 20 mg via INTRAVENOUS
  Filled 2016-02-02: qty 50

## 2016-02-02 MED ORDER — ONDANSETRON HCL 4 MG/2ML IJ SOLN
4.0000 mg | Freq: Once | INTRAMUSCULAR | Status: AC
  Administered 2016-02-02: 4 mg via INTRAVENOUS
  Filled 2016-02-02: qty 2

## 2016-02-02 MED ORDER — GI COCKTAIL ~~LOC~~
30.0000 mL | Freq: Once | ORAL | Status: AC
  Administered 2016-02-02: 30 mL via ORAL
  Filled 2016-02-02: qty 30

## 2016-02-02 MED ORDER — ONDANSETRON HCL 4 MG PO TABS: 4.0000 mg | ORAL_TABLET | Freq: Four times a day (QID) | ORAL | Status: DC

## 2016-02-02 NOTE — ED Notes (Signed)
Made three calls checked bathroom and parking area with no response.

## 2016-02-02 NOTE — ED Notes (Signed)
Bed: WA13 Expected date:  Expected time:  Means of arrival:  Comments: EMS 

## 2016-02-02 NOTE — Telephone Encounter (Signed)
Pt called in to check the status of his lab results. Pt says that the provider told him that they would be in by 10:00am. Informed pt that results aren't in yet (per CMA) and that someone will call him as soon as they are in.    CB: [phone]   OR [phone]

## 2016-02-02 NOTE — Telephone Encounter (Signed)
Lab drawn and sent out STAT .

## 2016-02-02 NOTE — Telephone Encounter (Signed)
I think 02/16/2016 check ok.

## 2016-02-02 NOTE — Progress Notes (Signed)
02/02/2016 Keith BeckmannMatthew A Zayas [id number] 1996/08/01   HISTORY OF PRESENT ILLNESS:  This is a 20 year old male who is new to our practice and was referred here by his PCP, Malva Coganody Martin, PA-C, for evaluation of nausea, vomiting, and upper abdominal pain. He tells me that for the past 2 weeks he has had upper abdominal pain, constant nausea, and vomiting several times per day. He tells me that the vomiting was occurring 3-4 times daily and he could not keep anything down. Now he still seems to have constant nausea, but is able to eat and has not had any further vomiting. He tells me one second that he has no appetite but then the next second says that he is so hungry and has been eating a ton of food. He went to the emergency department and had a CT scan of the abdomen and pelvis with contrast 2 days ago that was normal/negative. They placed him on omeprazole 20 mg daily approximately one week ago and he feels like that may has it has helped a little bit. He does admit to some heartburn and reflux issues prior to the onset of all of this. He used to smoke marijuana quite regularly but quit that about 3 or 4 weeks ago and says that his symptoms began shortly after that.  CBC, CMP, lipase, H. pylori IgG, TSH/T4 were all unremarkable (except for what appears to be an erroneous platelet count).   Past Medical History  Diagnosis Date  . Physical growth delay   . ADHD (attention deficit hyperactivity disorder)   . Goiter   . Hypothyroidism, acquired, autoimmune   . Thyroiditis, autoimmune   . Isosexual precocity   . Fatigue   . Iron excess    Past Surgical History  Procedure Laterality Date  . Chalazion excision    . Frenulectomy, lingual    . Tonsillectomy and adenoidectomy      reports that he has quit smoking. He started smoking about 4 years ago. He has never used smokeless tobacco. He reports that he uses illicit drugs ("Crack" cocaine, Other-see comments, and Marijuana). He reports that he does  not drink alcohol. family history includes Heart disease in his maternal grandmother and paternal grandfather; Thyroid disease in his maternal grandmother and paternal aunt. There is no history of Diabetes. No Known Allergies    Outpatient Encounter Prescriptions as of 02/02/2016  Medication Sig  . HYDROcodone-acetaminophen (NORCO/VICODIN) 5-325 MG tablet Take 1-2 tablets by mouth every 4 (four) hours as needed.  Marland Kitchen. omeprazole (PRILOSEC) 20 MG capsule Take 1 capsule (20 mg total) by mouth daily.  . ondansetron (ZOFRAN) 4 MG tablet Take 1 tablet (4 mg total) by mouth every 8 (eight) hours as needed for nausea or vomiting.  . promethazine (PHENERGAN) 25 MG tablet Take 1 tablet (25 mg total) by mouth every 6 (six) hours as needed for nausea or vomiting.  Marland Kitchen. SYNTHROID 25 MCG tablet Take 1 tablet (25 mcg total) by mouth daily before breakfast.   No facility-administered encounter medications on file as of 02/02/2016.     REVIEW OF SYSTEMS  : All other systems reviewed and negative except where noted in the History of Present Illness.   PHYSICAL EXAM: BP 136/82 mmHg  Pulse 88  Ht 5\' 2"  (1.575 m)  Wt 140 lb (63.504 kg)  BMI 25.60 kg/m2 General: Well developed white male in no acute distress; short stature. Head: Normocephalic and atraumatic Eyes:  Sclerae anicteric, conjunctiva pink. Ears: Normal auditory acuity  Lungs: Clear throughout to auscultation Heart: Regular rate and rhythm Abdomen: Soft, non-distended.  Normal bowel sounds.  Mild upper abdominal TTP without R/R/G. Musculoskeletal: Symmetrical with no gross deformities  Skin: No lesions on visible extremities Extremities: No edema  Neurological: Alert oriented x 4, grossly non-focal Psychological:  Alert and cooperative. Normal mood and affect  ASSESSMENT AND PLAN: -Nausea and vomiting with upper/epigastric abdominal pain:  There has been some question if he possibly has cannabinoid hyperemesis syndrome.  Stop smoking marijuana 3  or 4 weeks ago and has been having GI issues ever since then.  CT scan negative.  He has had some improvement of his symptoms on omeprazole 20 mg daily so he will continue that.  Due to the severity of his symptoms, we will schedule for an EGD for further evaluation to rule out ulcer disease, severe reflux esophagitis, etc.  The risks, benefits, and alternatives to EGD were discussed with the patient and he consents to proceed.   **Just of note, the patient had a very low platelet count yesterday of 24. I believe that this is probably erroneous since they were completely normal 2 days ago and normal again today.  CC:  Waldon Merl, PA-C

## 2016-02-02 NOTE — ED Notes (Signed)
Per EMS - patient comes from home with c/o diffuse lower abdominal pain and hematemesis x40 minutes.  Patient has been seen for same x4 in last week (including this morning) and has an EGD scheduled.  Patient states he is taking his prilosec, phenerghan and oxy for pain with no relief.  Patient states last marijuana use was 3 weeks ago, hx of cannabis induced vomiting.  Patient's vitals 123/82, HR 89, RR 18, 98% on RA.  CBG 111.

## 2016-02-02 NOTE — Progress Notes (Signed)
Thank you for sending this case to me. I have reviewed the entire note, and the outlined plan seems appropriate.  

## 2016-02-02 NOTE — Telephone Encounter (Signed)
Doc of Day - "emergency" referral placed for patient - PCP states patient has abdomen pain and vomiting for 2 weeks. Loss of weight about 10 pounds. Pt thinks he may have ulcer. Low platlets recently and referring to hematologist. But would like GI evaluation as well. Stat/asap

## 2016-02-02 NOTE — Telephone Encounter (Signed)
Sent referral to Oncology and Gastro/awaiting appt

## 2016-02-02 NOTE — ED Provider Notes (Signed)
CSN: [id number]     Arrival date & time 02/02/16  1759 History   First MD Initiated Contact with Patient 02/02/16 1902     Chief Complaint  Patient presents with  . Hematemesis   HPI Comments: 20 year old male who presents with abdominal pain, N/V for the past 2 weeks. Today he had an episode of bright red hematemesis after forceful vomiting. His abdominal pain is diffuse, constant, and feels like "hunger pangs" but is also sharp at times. Reports associated weakness and fatigue from not being able to eat. Denies fever, chest pain, SOB, diarrhea, dysuria.He was last seen in the ED on 4/26. CT of the abdomen was negative and he was d/c'ed with Prilosec, Phenergan, and Oxycodone which he states he has been taking. He reports some relief however he started vomiting again today. He also saw GI earlier today who repeated labs and scheduled him for an endoscopy next week. CBC, CMP, lipase, H. pylori IgG, TSH/T4 were all unremarkable. They seemed to think that his N/V may be related to marijuana withdrawal however he states he has not smoked any in 3-4 weeks.    Past Medical History  Diagnosis Date  . Physical growth delay   . ADHD (attention deficit hyperactivity disorder)   . Goiter   . Hypothyroidism, acquired, autoimmune   . Thyroiditis, autoimmune   . Isosexual precocity   . Fatigue   . Iron excess    Past Surgical History  Procedure Laterality Date  . Chalazion excision    . Frenulectomy, lingual    . Tonsillectomy and adenoidectomy     Family History  Problem Relation Age of Onset  . Thyroid disease Maternal Grandmother   . Heart disease Maternal Grandmother   . Diabetes Neg Hx   . Thyroid disease Paternal Aunt   . Heart disease Paternal Grandfather    Social History  Substance Use Topics  . Smoking status: Former Smoker -- 0.00 packs/day    Start date: 01/11/2012  . Smokeless tobacco: Never Used  . Alcohol Use: No    Review of Systems  Constitutional: Negative for fever  and chills.  Respiratory: Negative for shortness of breath.   Cardiovascular: Negative for chest pain.  Gastrointestinal: Positive for nausea, vomiting and abdominal pain. Negative for diarrhea and constipation.  Genitourinary: Negative for dysuria.  All other systems reviewed and are negative.     Allergies  Review of patient's allergies indicates no known allergies.  Home Medications   Prior to Admission medications   Medication Sig Start Date End Date Taking? Authorizing Provider  HYDROcodone-acetaminophen (NORCO/VICODIN) 5-325 MG tablet Take 1-2 tablets by mouth every 4 (four) hours as needed. Patient taking differently: Take 1-2 tablets by mouth every 4 (four) hours as needed for moderate pain.  01/31/16  Yes Donnetta HutchingBrian Cook, MD  omeprazole (PRILOSEC) 20 MG capsule Take 1 capsule (20 mg total) by mouth daily. 01/30/16  Yes Waldon MerlWilliam C Martin, PA-C  ondansetron (ZOFRAN) 4 MG tablet Take 1 tablet (4 mg total) by mouth every 8 (eight) hours as needed for nausea or vomiting. 01/30/16  Yes Waldon MerlWilliam C Martin, PA-C  promethazine (PHENERGAN) 25 MG tablet Take 1 tablet (25 mg total) by mouth every 6 (six) hours as needed for nausea or vomiting. 01/31/16  Yes Donnetta HutchingBrian Cook, MD  SYNTHROID 25 MCG tablet Take 1 tablet (25 mcg total) by mouth daily before breakfast. 01/30/16  Yes Waldon MerlWilliam C Martin, PA-C   BP 114/73 mmHg  Pulse 84  Temp(Src) 98.1 F (  36.7 C) (Oral)  Resp 18  SpO2 100%   Physical Exam  Constitutional: He is oriented to person, place, and time. He appears well-developed and well-nourished. No distress.  HENT:  Head: Normocephalic and atraumatic.  Eyes: Conjunctivae are normal. Pupils are equal, round, and reactive to light. Right eye exhibits no discharge. Left eye exhibits no discharge. No scleral icterus.  Neck: Normal range of motion.  Cardiovascular: Normal rate and regular rhythm.  Exam reveals no gallop and no friction rub.   No murmur heard. Pulmonary/Chest: Effort normal and breath  sounds normal. No respiratory distress. He has no wheezes. He has no rales. He exhibits no tenderness.  Abdominal: Soft. He exhibits no distension and no mass. Bowel sounds are decreased. There is tenderness. There is no rebound and no guarding.  Diffuse tenderness  Neurological: He is alert and oriented to person, place, and time.  Skin: Skin is warm and dry.  Psychiatric: He has a normal mood and affect.    ED Course  Procedures (including critical care time) Labs Review Labs Reviewed  COMPREHENSIVE METABOLIC PANEL - Abnormal; Notable for the following:    Glucose, Bld 116 (*)    All other components within normal limits    Imaging Review No results found. I have personally reviewed and evaluated these images and lab results as part of my medical decision-making.   EKG Interpretation None      MDM   Final diagnoses:  Hematemesis with nausea  Generalized abdominal pain   20 year old male with ongoing abdominal pain, N/V. GI cocktail, Pepcid, and Zofran given. Patient states he feels better after this. PO challenge tolerated well. Patient states he is able to eat at home and does not have emesis after every meal, just certain foods. Advised to avoid these foods until he can have EGD done.  GI has scheduled him for an EGD next week. Family asking for EGD tonight. Explained that this will not happen as he is hemodynamically stable with normal VS. Labs are unremarkable. They are frustrated that the symptoms have been persistent however patient and mother verbalize understanding. Return precautions given. Rx for Zofran and Phenergan suppository given.    Bethel Born, PA-C 02/03/16 1237  Tilden Fossa, MD 02/03/16 1450

## 2016-02-02 NOTE — Addendum Note (Signed)
Addended by: Neldon LabellaMABE, HOLDEN S on: 02/02/2016 03:39 PM   Modules accepted: Orders

## 2016-02-02 NOTE — Patient Instructions (Signed)
You have been scheduled for an endoscopy and flexible sigmoidoscopy. Please follow the written instructions given to you at your visit today. If you use inhalers (even only as needed), please bring them with you on the day of your procedure. Your physician has requested that you go to www.startemmi.com and enter the access code given to you at your visit today. This web site gives a general overview about your procedure. However, you should still follow specific instructions given to you by our office regarding your preparation for the procedure. 

## 2016-02-02 NOTE — Telephone Encounter (Signed)
Spoke with pt and advised him of the results and Pt states that he has appointments next week and he will not be able to come until the next week for repeat blood work. I have him coming in on 02/16/16 at 845 am.

## 2016-02-02 NOTE — Telephone Encounter (Signed)
I put him on the schedule for today at 2:00. Please notify patient or referring office.  He will see Doug SouJessica Zehr, PA.  Please have him arrive at 1:45

## 2016-02-05 ENCOUNTER — Telehealth: Payer: Self-pay | Admitting: Physician Assistant

## 2016-02-05 ENCOUNTER — Telehealth: Payer: Self-pay | Admitting: Medical

## 2016-02-05 NOTE — Telephone Encounter (Signed)
I talked with Dr Myna HidalgoEnnever last week when platlets were normal.

## 2016-02-05 NOTE — Telephone Encounter (Signed)
Patient Name: Keith Lawrence  DOB: September 26, 1996    Initial Comment caller states he has alot of blood in his stool this am   Nurse Assessment  Nurse: Stefano GaulStringer, RN, Dwana CurdVera Date/Time (Eastern Time): 02/05/2016 7:24:34 AM  Confirm and document reason for call. If symptomatic, describe symptoms. You must click the next button to save text entered. ---Caller states he was in the ER last week and his platelets were low. He vomited blood last week. Now he has blood in stool. He is eating a lot. No more fainting. He is very weak.  Has the patient traveled out of the country within the last 30 days? ---Not Applicable  Does the patient have any new or worsening symptoms? ---Yes  Will a triage be completed? ---Yes  Related visit to physician within the last 2 weeks? ---Yes  Does the PT have any chronic conditions? (i.e. diabetes, asthma, etc.) ---Yes  List chronic conditions. ---thyroid  Is this a behavioral health or substance abuse call? ---No     Guidelines    Guideline Title Affirmed Question Affirmed Notes  Rectal Bleeding [1] Constant abdominal pain AND [2] present > 2 hours    Final Disposition User   Go to ED Now Stefano GaulStringer, RN, Dwana CurdVera    Comments  Pt states he has been to ER x 3 and they don't do anything. Will call office when it opens at 8 am.   Referrals  GO TO FACILITY REFUSED   Disagree/Comply: Disagree  Disagree/Comply Reason: Disagree with instructions

## 2016-02-05 NOTE — Telephone Encounter (Signed)
Reviewed notes 

## 2016-02-06 ENCOUNTER — Ambulatory Visit (INDEPENDENT_AMBULATORY_CARE_PROVIDER_SITE_OTHER): Payer: 59 | Admitting: Physician Assistant

## 2016-02-06 ENCOUNTER — Encounter: Payer: Self-pay | Admitting: Physician Assistant

## 2016-02-06 ENCOUNTER — Ambulatory Visit: Payer: 59 | Admitting: Physician Assistant

## 2016-02-06 ENCOUNTER — Encounter: Payer: 59 | Admitting: Gastroenterology

## 2016-02-06 VITALS — BP 118/72 | HR 88 | Temp 98.1°F | Resp 16 | Ht 62.0 in | Wt 139.1 lb

## 2016-02-06 DIAGNOSIS — R509 Fever, unspecified: Secondary | ICD-10-CM

## 2016-02-06 DIAGNOSIS — K602 Anal fissure, unspecified: Secondary | ICD-10-CM | POA: Diagnosis not present

## 2016-02-06 DIAGNOSIS — R197 Diarrhea, unspecified: Secondary | ICD-10-CM

## 2016-02-06 DIAGNOSIS — R109 Unspecified abdominal pain: Secondary | ICD-10-CM | POA: Diagnosis not present

## 2016-02-06 LAB — CBC WITH DIFFERENTIAL/PLATELET
Basophils Absolute: 0 10*3/uL (ref 0.0–0.1)
Basophils Relative: 0.4 % (ref 0.0–3.0)
EOS PCT: 0.8 % (ref 0.0–5.0)
Eosinophils Absolute: 0 10*3/uL (ref 0.0–0.7)
HCT: 46 % (ref 39.0–52.0)
HEMOGLOBIN: 15.9 g/dL (ref 13.0–17.0)
LYMPHS ABS: 1.7 10*3/uL (ref 0.7–4.0)
Lymphocytes Relative: 28.9 % (ref 12.0–46.0)
MCHC: 34.7 g/dL (ref 30.0–36.0)
MCV: 88.9 fl (ref 78.0–100.0)
MONO ABS: 0.6 10*3/uL (ref 0.1–1.0)
Monocytes Relative: 11.1 % (ref 3.0–12.0)
NEUTROS PCT: 58.8 % (ref 43.0–77.0)
Neutro Abs: 3.4 10*3/uL (ref 1.4–7.7)
Platelets: 212 10*3/uL (ref 150.0–400.0)
RBC: 5.17 Mil/uL (ref 4.22–5.81)
RDW: 12.9 % (ref 11.5–14.6)
WBC: 5.8 10*3/uL (ref 4.5–10.5)

## 2016-02-06 LAB — BASIC METABOLIC PANEL
BUN: 14 mg/dL (ref 6–23)
CALCIUM: 10.4 mg/dL (ref 8.4–10.5)
CO2: 29 meq/L (ref 19–32)
Chloride: 101 mEq/L (ref 96–112)
Creatinine, Ser: 0.84 mg/dL (ref 0.40–1.50)
GFR: 123.06 mL/min (ref >60.00)
Glucose, Bld: 90 mg/dL (ref 70–99)
POTASSIUM: 4.2 meq/L (ref 3.5–5.1)
SODIUM: 138 meq/L (ref 135–145)

## 2016-02-06 LAB — SEDIMENTATION RATE: Sed Rate: 3 mm/hr (ref 0–22)

## 2016-02-06 MED ORDER — PROMETHAZINE HCL 25 MG RE SUPP: 25.0000 mg | Freq: Four times a day (QID) | RECTAL | Status: DC | PRN

## 2016-02-06 NOTE — Patient Instructions (Signed)
Please go to the lab for blood work and a stool kit.  I will call with your results. I am checking for several things. Please stay well hydrated. Increase the Omeprazole to twice daily. Continue your bland diet.   The loose stools and cough I feel are more indicative of continued GI inflammation but I do not see signs of bacterial infection -- the stool studies and blood work will confirm or rule this out. We will start antibiotic if indicated based on the results.  Again I will call as soon as I have results so we can get the endoscopies set back up ASAP.

## 2016-02-06 NOTE — Progress Notes (Signed)
Pre visit review using our clinic review tool, if applicable. No additional management support is needed unless otherwise documented below in the visit note/SLS  

## 2016-02-07 ENCOUNTER — Telehealth: Payer: Self-pay | Admitting: Physician Assistant

## 2016-02-07 ENCOUNTER — Ambulatory Visit: Payer: 59 | Admitting: Physician Assistant

## 2016-02-07 ENCOUNTER — Telehealth: Payer: Self-pay | Admitting: Behavioral Health

## 2016-02-07 NOTE — Telephone Encounter (Signed)
Patient states he was unaware he had a 10am appointment today, charge or no charge. Please advise

## 2016-02-07 NOTE — Telephone Encounter (Signed)
See below

## 2016-02-07 NOTE — Telephone Encounter (Signed)
[facility] note received via fax  Call Date: 02/06/16 Time: 6:41 PM   Caller: [name] (Self) Return number: [phone]  Nurse: Larrie KassElizabeth Greenawalt, RN  Chief Complaint: Neck Stiffiness  Reason for call: Caller states was seen today and having pain behind ribs when he breathes and neck and jaw are stiff.  Related visit to physician within the last 2 weeks: Yes  Guideline: Neck Pain or Stiffness  Disposition: See PCP when office is open (within 3 days)    Called to follow-up with the patient.

## 2016-02-07 NOTE — Progress Notes (Signed)
Patient presents to clinic today as a walk-in for continued epigastric pain with nausea and cyclic vomiting. Patient was at GI office this morning to have an upper endoscopy and flexible sigmoidoscopy for his ongoing symptoms when he was noted to have a fever of 100. Procedure was canceled and he was told to see his PCP. Patient denies known fever at home. Denies cough, sore throat or URI symptoms. Did note a few loose stools over the past 2 days with one episode of BRBPR. Denies tenesmus or melena. Denies alcohol, NSAID or marijuana use. Endorses worening heart burn. Wants assessment so he can reschedule GI procedures ASAP.  Past Medical History  Diagnosis Date  . Physical growth delay   . ADHD (attention deficit hyperactivity disorder)   . Goiter   . Hypothyroidism, acquired, autoimmune   . Thyroiditis, autoimmune   . Isosexual precocity   . Fatigue   . Iron excess     Current Outpatient Prescriptions on File Prior to Visit  Medication Sig Dispense Refill  . omeprazole (PRILOSEC) 20 MG capsule Take 1 capsule (20 mg total) by mouth daily. 30 capsule 3  . ondansetron (ZOFRAN) 4 MG tablet Take 1 tablet (4 mg total) by mouth every 6 (six) hours. 12 tablet 0  . SYNTHROID 25 MCG tablet Take 1 tablet (25 mcg total) by mouth daily before breakfast. 30 tablet 5   No current facility-administered medications on file prior to visit.    No Known Allergies  Family History  Problem Relation Age of Onset  . Thyroid disease Maternal Grandmother   . Heart disease Maternal Grandmother   . Diabetes Neg Hx   . Thyroid disease Paternal Aunt   . Heart disease Paternal Grandfather     Social History   Social History  . Marital Status: Single    Spouse Name: N/A  . Number of Children: N/A  . Years of Education: N/A   Social History Main Topics  . Smoking status: Former Smoker -- 0.00 packs/day    Start date: 01/11/2012  . Smokeless tobacco: Never Used  . Alcohol Use: No  . Drug Use: Yes      Special: "Crack" cocaine, Other-see comments, Marijuana  . Sexual Activity: Not Asked   Other Topics Concern  . None   Social History Narrative   Lives with foster parents. Mom still has guardianship.  [employer].    Review of Systems - See HPI.  All other ROS are negative.  BP 118/72 mmHg  Pulse 88  Temp(Src) 98.1 F (36.7 C) (Oral)  Resp 16  Ht '5\' 2"'  (1.575 m)  Wt 139 lb 2 oz (63.107 kg)  BMI 25.44 kg/m2  SpO2 98%  Physical Exam  Constitutional: He is oriented to person, place, and time and well-developed, well-nourished, and in no distress.  HENT:  Head: Normocephalic and atraumatic.  Right Ear: External ear normal.  Left Ear: External ear normal.  Nose: Nose normal.  Mouth/Throat: Oropharynx is clear and moist. No oropharyngeal exudate.  Eyes: Conjunctivae are normal.  Neck: Neck supple.  Cardiovascular: Normal rate, regular rhythm, normal heart sounds and intact distal pulses.   Pulmonary/Chest: Effort normal and breath sounds normal. No respiratory distress. He has no wheezes. He has no rales. He exhibits no tenderness.  Abdominal: Soft. He exhibits no distension. Bowel sounds are hyperactive. There is no tenderness.  Genitourinary: Rectal exam shows fissure. Rectal exam shows no external hemorrhoid.  Neurological: He is alert and oriented to person, place,  and time.  Skin: Skin is warm and dry. No rash noted.  Psychiatric: Affect normal.  Vitals reviewed.   Recent Results (from the past 2160 hour(s))  TSH     Status: None   Collection Time: 01/27/16  9:05 PM  Result Value Ref Range   TSH 3.601 0.350 - 4.500 uIU/mL    Comment: Performed at [facility]  T4     Status: None   Collection Time: 01/27/16  9:05 PM  Result Value Ref Range   T4, Total 7.2 4.5 - 12.0 ug/dL    Comment: (NOTE) Performed At: [facility] [place], [place] [id number] Lindon Romp MD MG:[id number]   Lipase, blood     Status: None    Collection Time: 01/27/16  9:05 PM  Result Value Ref Range   Lipase 14 11 - 51 U/L  Basic metabolic panel     Status: Abnormal   Collection Time: 01/27/16  9:05 PM  Result Value Ref Range   Sodium 139 135 - 145 mmol/L   Potassium 3.6 3.5 - 5.1 mmol/L   Chloride 105 101 - 111 mmol/L   CO2 23 22 - 32 mmol/L   Glucose, Bld 100 (H) 65 - 99 mg/dL   BUN 12 6 - 20 mg/dL   Creatinine, Ser 0.88 0.61 - 1.24 mg/dL   Calcium 9.6 8.9 - 10.3 mg/dL   GFR calc non Af Amer >60 >60 mL/min   GFR calc Af Amer >60 >60 mL/min    Comment: (NOTE) The eGFR has been calculated using the CKD EPI equation. This calculation has not been validated in all clinical situations. eGFR's persistently <60 mL/min signify possible Chronic Kidney Disease.    Anion gap 11 5 - 15  CBC with Differential/Platelet     Status: Abnormal   Collection Time: 01/27/16  9:05 PM  Result Value Ref Range   WBC 7.1 4.0 - 10.5 K/uL   RBC 4.88 4.22 - 5.81 MIL/uL   Hemoglobin 15.4 13.0 - 17.0 g/dL   HCT 42.0 39.0 - 52.0 %   MCV 86.1 78.0 - 100.0 fL   MCH 31.6 26.0 - 34.0 pg   MCHC 36.7 (H) 30.0 - 36.0 g/dL   RDW 12.1 11.5 - 15.5 %   Platelets 210 150 - 400 K/uL   Neutrophils Relative % 60 %   Neutro Abs 4.3 1.7 - 7.7 K/uL   Lymphocytes Relative 29 %   Lymphs Abs 2.1 0.7 - 4.0 K/uL   Monocytes Relative 10 %   Monocytes Absolute 0.7 0.1 - 1.0 K/uL   Eosinophils Relative 1 %   Eosinophils Absolute 0.1 0.0 - 0.7 K/uL   Basophils Relative 0 %   Basophils Absolute 0.0 0.0 - 0.1 K/uL  H. pylori antibody, IgG     Status: None   Collection Time: 01/30/16 10:23 AM  Result Value Ref Range   H Pylori IgG Negative Negative  Comp Met (CMET)     Status: Abnormal   Collection Time: 01/30/16 10:23 AM  Result Value Ref Range   Sodium 139 135 - 145 mEq/L   Potassium 4.0 3.5 - 5.1 mEq/L   Chloride 102 96 - 112 mEq/L   CO2 31 19 - 32 mEq/L   Glucose, Bld 104 (H) 70 - 99 mg/dL   BUN 14 6 - 23 mg/dL   Creatinine, Ser 0.88 0.40 - 1.50 mg/dL    Total Bilirubin 0.3 0.2 - 1.2 mg/dL   Alkaline Phosphatase 42 39 -  117 U/L   AST 17 0 - 37 U/L   ALT 19 0 - 53 U/L   Total Protein 7.2 6.0 - 8.3 g/dL   Albumin 4.6 3.5 - 5.2 g/dL   Calcium 10.6 (H) 8.4 - 10.5 mg/dL   GFR 116.65 >60.00 mL/min  CBC     Status: None   Collection Time: 01/30/16 10:23 AM  Result Value Ref Range   WBC 7.4 4.5 - 10.5 K/uL   RBC 4.98 4.22 - 5.81 Mil/uL   Platelets 220.0 150.0 - 400.0 K/uL   Hemoglobin 15.4 13.0 - 17.0 g/dL   HCT 44.1 39.0 - 52.0 %   MCV 88.5 78.0 - 100.0 fl   MCHC 35.0 30.0 - 36.0 g/dL   RDW 12.7 11.5 - 14.6 %  CBC with Differential     Status: Abnormal   Collection Time: 01/31/16  9:26 AM  Result Value Ref Range   WBC 4.3 4.0 - 10.5 K/uL   RBC 4.99 4.22 - 5.81 MIL/uL   Hemoglobin 15.6 13.0 - 17.0 g/dL   HCT 42.8 39.0 - 52.0 %   MCV 85.8 78.0 - 100.0 fL   MCH 31.3 26.0 - 34.0 pg   MCHC 36.4 (H) 30.0 - 36.0 g/dL   RDW 12.4 11.5 - 15.5 %   Platelets 24 (LL) 150 - 400 K/uL    Comment: SPECIMEN CHECKED FOR CLOTS REPEATED TO VERIFY PLATELET COUNT CONFIRMED BY SMEAR CRITICAL RESULT CALLED TO, READ BACK BY AND VERIFIED WITH: S.BINGHAM RN [id number] A.QUIZON    Neutrophils Relative % 60 %   Neutro Abs 2.6 1.7 - 7.7 K/uL   Lymphocytes Relative 29 %   Lymphs Abs 1.2 0.7 - 4.0 K/uL   Monocytes Relative 11 %   Monocytes Absolute 0.5 0.1 - 1.0 K/uL   Eosinophils Relative 1 %   Eosinophils Absolute 0.0 0.0 - 0.7 K/uL   Basophils Relative 0 %   Basophils Absolute 0.0 0.0 - 0.1 K/uL  Lipase, blood     Status: None   Collection Time: 01/31/16  9:26 AM  Result Value Ref Range   Lipase 34 11 - 51 U/L  Comprehensive metabolic panel     Status: None   Collection Time: 01/31/16  9:26 AM  Result Value Ref Range   Sodium 138 135 - 145 mmol/L   Potassium 5.0 3.5 - 5.1 mmol/L   Chloride 104 101 - 111 mmol/L   CO2 26 22 - 32 mmol/L   Glucose, Bld 93 65 - 99 mg/dL   BUN 13 6 - 20 mg/dL   Creatinine, Ser 0.84 0.61 - 1.24 mg/dL   Calcium  10.0 8.9 - 10.3 mg/dL   Total Protein 7.3 6.5 - 8.1 g/dL   Albumin 4.7 3.5 - 5.0 g/dL   AST 28 15 - 41 U/L   ALT 20 17 - 63 U/L   Alkaline Phosphatase 42 38 - 126 U/L   Total Bilirubin 0.7 0.3 - 1.2 mg/dL   GFR calc non Af Amer >60 >60 mL/min   GFR calc Af Amer >60 >60 mL/min    Comment: (NOTE) The eGFR has been calculated using the CKD EPI equation. This calculation has not been validated in all clinical situations. eGFR's persistently <60 mL/min signify possible Chronic Kidney Disease.    Anion gap 8 5 - 15  Urinalysis, Routine w reflex microscopic     Status: Abnormal   Collection Time: 01/31/16 11:10 AM  Result Value Ref Range   Color, Urine  YELLOW YELLOW   APPearance CLOUDY (A) CLEAR   Specific Gravity, Urine 1.025 1.005 - 1.030   pH 7.5 5.0 - 8.0   Glucose, UA NEGATIVE NEGATIVE mg/dL   Hgb urine dipstick NEGATIVE NEGATIVE   Bilirubin Urine NEGATIVE NEGATIVE   Ketones, ur NEGATIVE NEGATIVE mg/dL   Protein, ur NEGATIVE NEGATIVE mg/dL   Nitrite NEGATIVE NEGATIVE   Leukocytes, UA NEGATIVE NEGATIVE    Comment: MICROSCOPIC NOT DONE ON URINES WITH NEGATIVE PROTEIN, BLOOD, LEUKOCYTES, NITRITE, OR GLUCOSE <1000 mg/dL.  CBC w/Diff     Status: None   Collection Time: 02/02/16  7:09 AM  Result Value Ref Range   WBC 6.1 4.5 - 10.5 K/uL   RBC 5.01 4.22 - 5.81 Mil/uL   Hemoglobin 15.4 13.0 - 17.0 g/dL   HCT 44.5 39.0 - 52.0 %   MCV 88.7 78.0 - 100.0 fl   MCHC 34.6 30.0 - 36.0 g/dL   RDW 13.1 11.5 - 14.6 %   Platelets 208.0 150.0 - 400.0 K/uL   Neutrophils Relative % 61.5 43.0 - 77.0 %   Lymphocytes Relative 27.6 12.0 - 46.0 %   Monocytes Relative 9.3 3.0 - 12.0 %   Eosinophils Relative 1.2 0.0 - 5.0 %   Basophils Relative 0.4 0.0 - 3.0 %   Neutro Abs 3.7 1.4 - 7.7 K/uL   Lymphs Abs 1.7 0.7 - 4.0 K/uL   Monocytes Absolute 0.6 0.1 - 1.0 K/uL   Eosinophils Absolute 0.1 0.0 - 0.7 K/uL   Basophils Absolute 0.0 0.0 - 0.1 K/uL  Comprehensive metabolic panel     Status: Abnormal    Collection Time: 02/02/16  8:03 PM  Result Value Ref Range   Sodium 140 135 - 145 mmol/L   Potassium 4.0 3.5 - 5.1 mmol/L   Chloride 103 101 - 111 mmol/L   CO2 26 22 - 32 mmol/L   Glucose, Bld 116 (H) 65 - 99 mg/dL   BUN 12 6 - 20 mg/dL   Creatinine, Ser 0.76 0.61 - 1.24 mg/dL   Calcium 9.5 8.9 - 10.3 mg/dL   Total Protein 7.2 6.5 - 8.1 g/dL   Albumin 4.7 3.5 - 5.0 g/dL   AST 23 15 - 41 U/L   ALT 18 17 - 63 U/L   Alkaline Phosphatase 42 38 - 126 U/L   Total Bilirubin 0.5 0.3 - 1.2 mg/dL   GFR calc non Af Amer >60 >60 mL/min   GFR calc Af Amer >60 >60 mL/min    Comment: (NOTE) The eGFR has been calculated using the CKD EPI equation. This calculation has not been validated in all clinical situations. eGFR's persistently <60 mL/min signify possible Chronic Kidney Disease.    Anion gap 11 5 - 15  CBC w/Diff     Status: None   Collection Time: 02/06/16 10:34 AM  Result Value Ref Range   WBC 5.8 4.5 - 10.5 K/uL   RBC 5.17 4.22 - 5.81 Mil/uL   Hemoglobin 15.9 13.0 - 17.0 g/dL   HCT 46.0 39.0 - 52.0 %   MCV 88.9 78.0 - 100.0 fl   MCHC 34.7 30.0 - 36.0 g/dL   RDW 12.9 11.5 - 14.6 %   Platelets 212.0 150.0 - 400.0 K/uL   Neutrophils Relative % 58.8 43.0 - 77.0 %   Lymphocytes Relative 28.9 12.0 - 46.0 %   Monocytes Relative 11.1 3.0 - 12.0 %   Eosinophils Relative 0.8 0.0 - 5.0 %   Basophils Relative 0.4 0.0 - 3.0 %  Neutro Abs 3.4 1.4 - 7.7 K/uL   Lymphs Abs 1.7 0.7 - 4.0 K/uL   Monocytes Absolute 0.6 0.1 - 1.0 K/uL   Eosinophils Absolute 0.0 0.0 - 0.7 K/uL   Basophils Absolute 0.0 0.0 - 0.1 K/uL  Basic Metabolic Panel (BMET)     Status: None   Collection Time: 02/06/16 10:34 AM  Result Value Ref Range   Sodium 138 135 - 145 mEq/L   Potassium 4.2 3.5 - 5.1 mEq/L   Chloride 101 96 - 112 mEq/L   CO2 29 19 - 32 mEq/L   Glucose, Bld 90 70 - 99 mg/dL   BUN 14 6 - 23 mg/dL   Creatinine, Ser 0.84 0.40 - 1.50 mg/dL   Calcium 10.4 8.4 - 10.5 mg/dL   GFR 123.06 >60.00 mL/min    Sed Rate (ESR)     Status: None   Collection Time: 02/06/16 10:34 AM  Result Value Ref Range   Sed Rate 3 0 - 22 mm/hr   Assessment/Plan: 1. Diarrhea, unspecified type Mild x 2 days. Giving recent chronic symptoms it is hard to assess acrte viral illness from a inflammatory process causing chronic symptoms with episodic flares. Examination does reveal some hyperactive bowel sounds. No palpable tenderness. Will obtain lab assessment including stool cultures.BRAT diet discussed. Rx phenergan. PO fluids encouraged.   - promethazine (PHENERGAN) 25 MG suppository; Place 1 suppository (25 mg total) rectally every 6 (six) hours as needed for nausea or vomiting.  Dispense: 12 each; Refill: 0 - CBC w/Diff - Stool Culture - Ova and parasite examination - Stool C-Diff Toxin Assay - Basic Metabolic Panel (BMET) - Sed Rate (ESR)  2. Fever, unspecified Afebrile in office without use of Tylenol or NSAID. Patient denies having fever at home. Transient fever versus thermometer error. Will assess further with labs. - CBC w/Diff - Basic Metabolic Panel (BMET) - Sed Rate (ESR)  3. Abdominal pain, unspecified abdominal location Chronic now with acute diarrhea. GI workup in process. Continue PPI but will increase to BID. Stool studies today. Will repeat labs. He is to call GI to reschedule endoscopies.  - CBC w/Diff - Stool Culture - Ova and parasite examination - Stool C-Diff Toxin Assay - Basic Metabolic Panel (BMET) - Sed Rate (ESR)  4. Anal fissure Very mild. Diet and bowel regimen reviewed. Sitz baths discussed.

## 2016-02-07 NOTE — Telephone Encounter (Signed)
No charge -- he was seen yesterday in office.

## 2016-02-07 NOTE — Telephone Encounter (Signed)
Attempted to reach patient for follow-up per [place], PA-C. No answer at this time. Voice recording stated, patient is "unavailable". Will try again later.

## 2016-02-08 ENCOUNTER — Encounter: Payer: 59 | Admitting: Gastroenterology

## 2016-02-12 NOTE — Telephone Encounter (Signed)
Caller: Keith Lawrence (Self)  Return number: [phone]  Returning call

## 2016-02-12 NOTE — Telephone Encounter (Signed)
Patient was calling to inquire about lab work completed last week. Informed patient of the below results and provider's recommendations.  Notes Recorded by Waldon MerlWilliam C Martin, PA-C on 02/06/2016 at 2:53 PM Blood work is normal. Will call once stool results are in.   He voiced understanding and did not have any questions or concerns. Per the patient, he has not provided a stool sample yet, but is aware that he has an upcoming lab appointment on 02/16/16 at 8:45 AM for repeat blood work.

## 2016-02-16 ENCOUNTER — Other Ambulatory Visit (INDEPENDENT_AMBULATORY_CARE_PROVIDER_SITE_OTHER): Payer: 59

## 2016-02-16 DIAGNOSIS — D696 Thrombocytopenia, unspecified: Secondary | ICD-10-CM | POA: Diagnosis not present

## 2016-02-16 LAB — CBC WITH DIFFERENTIAL/PLATELET
BASOS PCT: 0.4 % (ref 0.0–3.0)
Basophils Absolute: 0 10*3/uL (ref 0.0–0.1)
EOS ABS: 0.1 10*3/uL (ref 0.0–0.7)
Eosinophils Relative: 1.2 % (ref 0.0–5.0)
HEMATOCRIT: 45.1 % (ref 39.0–52.0)
HEMOGLOBIN: 15.6 g/dL (ref 13.0–17.0)
LYMPHS PCT: 29.3 % (ref 12.0–46.0)
Lymphs Abs: 1.9 10*3/uL (ref 0.7–4.0)
MCHC: 34.7 g/dL (ref 30.0–36.0)
MCV: 89.4 fl (ref 78.0–100.0)
MONOS PCT: 8.6 % (ref 3.0–12.0)
Monocytes Absolute: 0.6 10*3/uL (ref 0.1–1.0)
Neutro Abs: 3.9 10*3/uL (ref 1.4–7.7)
Neutrophils Relative %: 60.5 % (ref 43.0–77.0)
Platelets: 235 10*3/uL (ref 150.0–400.0)
RBC: 5.04 Mil/uL (ref 4.22–5.81)
RDW: 13.1 % (ref 11.5–14.6)
WBC: 6.5 10*3/uL (ref 4.5–10.5)

## 2016-02-20 ENCOUNTER — Telehealth: Payer: Self-pay | Admitting: *Deleted

## 2016-02-20 NOTE — Telephone Encounter (Signed)
FYI: Stool Collection  Received: Yesterday    Glennon MacKristy M Price  Jolan Mealor L Cleotha Tsang, CMA        Hey, this patient had Stool collection orders entered on 02/06/16. He dropped of his stool but unable to process the specimen because he did not collect all the tests and the one he did collect was not enough for the lab to run....Marland Kitchen.KMP

## 2016-02-20 NOTE — Telephone Encounter (Signed)
Please inform patient. Will need recollect unless he has followed up with GI.

## 2016-02-21 NOTE — Telephone Encounter (Signed)
Can be reached: [phone]   Reason for call: pt states he does not have a ride to come in and p/u sample kit. He said he'll come get that as soon as possible.

## 2016-02-21 NOTE — Telephone Encounter (Signed)
Noted  

## 2016-02-21 NOTE — Telephone Encounter (Signed)
LMOM with contact name and number [for return call, if needed] RE: stool sample and further provider instructions/SLS 05/17

## 2016-03-01 ENCOUNTER — Ambulatory Visit (INDEPENDENT_AMBULATORY_CARE_PROVIDER_SITE_OTHER): Payer: 59 | Admitting: Physician Assistant

## 2016-03-01 ENCOUNTER — Ambulatory Visit (HOSPITAL_BASED_OUTPATIENT_CLINIC_OR_DEPARTMENT_OTHER)
Admission: RE | Admit: 2016-03-01 | Discharge: 2016-03-01 | Disposition: A | Discharge status: Home / Self Care | Payer: 59 | Source: Ambulatory Visit | Attending: Physician Assistant | Admitting: Physician Assistant

## 2016-03-01 ENCOUNTER — Encounter: Payer: Self-pay | Admitting: Physician Assistant

## 2016-03-01 VITALS — BP 102/68 | HR 79 | Temp 98.6°F | Ht 62.0 in | Wt 144.4 lb

## 2016-03-01 DIAGNOSIS — R1084 Generalized abdominal pain: Secondary | ICD-10-CM | POA: Insufficient documentation

## 2016-03-01 DIAGNOSIS — K5900 Constipation, unspecified: Secondary | ICD-10-CM | POA: Diagnosis not present

## 2016-03-01 NOTE — Progress Notes (Signed)
Pre visit review using our clinic review tool, if applicable. No additional management support is needed unless otherwise documented below in the visit note. 

## 2016-03-01 NOTE — Patient Instructions (Signed)
Please go downstairs for an x-ray so I can assess your stool burden and r/o any concern for a bowel obstruction.   I feel your symptoms are stemming more from undiagnosed intestinal issue more than the mild hernia you are noting but both need assessment. I want you to call the GI office back to reschedule your upper endoscopy and colonoscopy as we need to take a direct look at things. The number is [phone]   I can also set you up with General Surgery for assessment of the hernia to see if they feel it is contributing. On exam I feel a bulge above the belly button when you increase your abdominal pressure. Everything goes back in place once pressure has decreased. The area is also not hard or red which is a great finding.  Stay hydrated and eat a bland diet. Follow the bowel regimen below for constipation to keep things moving. This should help with nausea.  I encourage you to increase hydration and the amount of fiber in your diet.  Start a daily probiotic (Align, Culturelle, Digestive Advantage, etc.). If no bowel movement within 24 hours, take 2 Tbs of Milk of Magnesia in a 4 oz glass of warmed prune juice every 2-3 days to help promote bowel movement. If no results within 24 hours, then repeat above regimen, adding a Dulcolax stool softener to regimen. If this does not promote a bowel movement, please call the office.

## 2016-03-01 NOTE — Progress Notes (Signed)
Patient presents to clinic today c/o continued abdominal discomfort, now with a mild bulge in abdomen when eating. Endorses sensation of fullness, associated with constipation. Nausea has continued with one episode of non-bloody emesis after dinner last night. Patient has previously been assessed with negative CT Abdomen/Pelvis. Has been referred to GI, was scheduled for EGD and colonoscopy but was rescheduled. Patient has not followed up since that time. Is not sure when scoping is scheduled. Denies hematochezia, melena, tenesmus.   Past Medical History  Diagnosis Date  . Physical growth delay   . ADHD (attention deficit hyperactivity disorder)   . Goiter   . Hypothyroidism, acquired, autoimmune   . Thyroiditis, autoimmune   . Isosexual precocity   . Fatigue   . Iron excess     Current Outpatient Prescriptions on File Prior to Visit  Medication Sig Dispense Refill  . SYNTHROID 25 MCG tablet Take 1 tablet (25 mcg total) by mouth daily before breakfast. 30 tablet 5  . omeprazole (PRILOSEC) 20 MG capsule Take 1 capsule (20 mg total) by mouth daily. (Patient not taking: Reported on 03/01/2016) 30 capsule 3  . ondansetron (ZOFRAN) 4 MG tablet Take 1 tablet (4 mg total) by mouth every 6 (six) hours. (Patient not taking: Reported on 03/01/2016) 12 tablet 0  . promethazine (PHENERGAN) 25 MG suppository Place 1 suppository (25 mg total) rectally every 6 (six) hours as needed for nausea or vomiting. (Patient not taking: Reported on 03/01/2016) 12 each 0   No current facility-administered medications on file prior to visit.    No Known Allergies  Family History  Problem Relation Age of Onset  . Thyroid disease Maternal Grandmother   . Heart disease Maternal Grandmother   . Diabetes Neg Hx   . Thyroid disease Paternal Aunt   . Heart disease Paternal Grandfather     Social History   Social History  . Marital Status: Single    Spouse Name: N/A  . Number of Children: N/A  . Years of  Education: N/A   Social History Main Topics  . Smoking status: Former Smoker -- 0.00 packs/day    Start date: 01/11/2012  . Smokeless tobacco: Never Used  . Alcohol Use: No  . Drug Use: Yes    Special: "Crack" cocaine, Other-see comments, Marijuana  . Sexual Activity: Not Asked   Other Topics Concern  . None   Social History Narrative   Lives with foster parents. Mom still has guardianship.  [employer].     Review of Systems - See HPI.  All other ROS are negative.  BP 102/68 mmHg  Pulse 79  Temp(Src) 98.6 F (37 C) (Oral)  Ht 5' 2" (1.575 m)  Wt 144 lb 6 oz (65.488 kg)  BMI 26.40 kg/m2  SpO2 98%  Physical Exam  Constitutional: He is oriented to person, place, and time and well-developed, well-nourished, and in no distress.  HENT:  Head: Normocephalic and atraumatic.  Eyes: Conjunctivae are normal.  Neck: Neck supple.  Cardiovascular: Normal rate, regular rhythm, normal heart sounds and intact distal pulses.   Pulmonary/Chest: Effort normal and breath sounds normal. No respiratory distress. He has no wheezes. He has no rales. He exhibits no tenderness.  Abdominal: Soft. Bowel sounds are normal. He exhibits no distension and no mass. There is no rebound and no guarding.  + epigastric tenderness  Neurological: He is alert and oriented to person, place, and time.  Vitals reviewed.   Recent Results (from the past 2160  hour(s))  TSH     Status: None   Collection Time: 01/27/16  9:05 PM  Result Value Ref Range   TSH 3.601 0.350 - 4.500 uIU/mL    Comment: Performed at [facility]  T4     Status: None   Collection Time: 01/27/16  9:05 PM  Result Value Ref Range   T4, Total 7.2 4.5 - 12.0 ug/dL    Comment: (NOTE) Performed At: [facility] [place], [place] [id number] Lindon Romp MD [id number]   Lipase, blood     Status: None   Collection Time: 01/27/16  9:05 PM  Result Value Ref Range   Lipase 14 11 - 51 U/L  Basic  metabolic panel     Status: Abnormal   Collection Time: 01/27/16  9:05 PM  Result Value Ref Range   Sodium 139 135 - 145 mmol/L   Potassium 3.6 3.5 - 5.1 mmol/L   Chloride 105 101 - 111 mmol/L   CO2 23 22 - 32 mmol/L   Glucose, Bld 100 (H) 65 - 99 mg/dL   BUN 12 6 - 20 mg/dL   Creatinine, Ser 0.88 0.61 - 1.24 mg/dL   Calcium 9.6 8.9 - 10.3 mg/dL   GFR calc non Af Amer >60 >60 mL/min   GFR calc Af Amer >60 >60 mL/min    Comment: (NOTE) The eGFR has been calculated using the CKD EPI equation. This calculation has not been validated in all clinical situations. eGFR's persistently <60 mL/min signify possible Chronic Kidney Disease.    Anion gap 11 5 - 15  CBC with Differential/Platelet     Status: Abnormal   Collection Time: 01/27/16  9:05 PM  Result Value Ref Range   WBC 7.1 4.0 - 10.5 K/uL   RBC 4.88 4.22 - 5.81 MIL/uL   Hemoglobin 15.4 13.0 - 17.0 g/dL   HCT 42.0 39.0 - 52.0 %   MCV 86.1 78.0 - 100.0 fL   MCH 31.6 26.0 - 34.0 pg   MCHC 36.7 (H) 30.0 - 36.0 g/dL   RDW 12.1 11.5 - 15.5 %   Platelets 210 150 - 400 K/uL   Neutrophils Relative % 60 %   Neutro Abs 4.3 1.7 - 7.7 K/uL   Lymphocytes Relative 29 %   Lymphs Abs 2.1 0.7 - 4.0 K/uL   Monocytes Relative 10 %   Monocytes Absolute 0.7 0.1 - 1.0 K/uL   Eosinophils Relative 1 %   Eosinophils Absolute 0.1 0.0 - 0.7 K/uL   Basophils Relative 0 %   Basophils Absolute 0.0 0.0 - 0.1 K/uL  H. pylori antibody, IgG     Status: None   Collection Time: 01/30/16 10:23 AM  Result Value Ref Range   H Pylori IgG Negative Negative  Comp Met (CMET)     Status: Abnormal   Collection Time: 01/30/16 10:23 AM  Result Value Ref Range   Sodium 139 135 - 145 mEq/L   Potassium 4.0 3.5 - 5.1 mEq/L   Chloride 102 96 - 112 mEq/L   CO2 31 19 - 32 mEq/L   Glucose, Bld 104 (H) 70 - 99 mg/dL   BUN 14 6 - 23 mg/dL   Creatinine, Ser 0.88 0.40 - 1.50 mg/dL   Total Bilirubin 0.3 0.2 - 1.2 mg/dL   Alkaline Phosphatase 42 39 - 117 U/L   AST 17 0 -  37 U/L   ALT 19 0 - 53 U/L   Total Protein 7.2 6.0 - 8.3  g/dL   Albumin 4.6 3.5 - 5.2 g/dL   Calcium 10.6 (H) 8.4 - 10.5 mg/dL   GFR 116.65 >60.00 mL/min  CBC     Status: None   Collection Time: 01/30/16 10:23 AM  Result Value Ref Range   WBC 7.4 4.5 - 10.5 K/uL   RBC 4.98 4.22 - 5.81 Mil/uL   Platelets 220.0 150.0 - 400.0 K/uL   Hemoglobin 15.4 13.0 - 17.0 g/dL   HCT 44.1 39.0 - 52.0 %   MCV 88.5 78.0 - 100.0 fl   MCHC 35.0 30.0 - 36.0 g/dL   RDW 12.7 11.5 - 14.6 %  CBC with Differential     Status: Abnormal   Collection Time: 01/31/16  9:26 AM  Result Value Ref Range   WBC 4.3 4.0 - 10.5 K/uL   RBC 4.99 4.22 - 5.81 MIL/uL   Hemoglobin 15.6 13.0 - 17.0 g/dL   HCT 42.8 39.0 - 52.0 %   MCV 85.8 78.0 - 100.0 fL   MCH 31.3 26.0 - 34.0 pg   MCHC 36.4 (H) 30.0 - 36.0 g/dL   RDW 12.4 11.5 - 15.5 %   Platelets 24 (LL) 150 - 400 K/uL    Comment: SPECIMEN CHECKED FOR CLOTS REPEATED TO VERIFY PLATELET COUNT CONFIRMED BY SMEAR CRITICAL RESULT CALLED TO, READ BACK BY AND VERIFIED WITH: S.BINGHAM RN [id number] A.QUIZON    Neutrophils Relative % 60 %   Neutro Abs 2.6 1.7 - 7.7 K/uL   Lymphocytes Relative 29 %   Lymphs Abs 1.2 0.7 - 4.0 K/uL   Monocytes Relative 11 %   Monocytes Absolute 0.5 0.1 - 1.0 K/uL   Eosinophils Relative 1 %   Eosinophils Absolute 0.0 0.0 - 0.7 K/uL   Basophils Relative 0 %   Basophils Absolute 0.0 0.0 - 0.1 K/uL  Lipase, blood     Status: None   Collection Time: 01/31/16  9:26 AM  Result Value Ref Range   Lipase 34 11 - 51 U/L  Comprehensive metabolic panel     Status: None   Collection Time: 01/31/16  9:26 AM  Result Value Ref Range   Sodium 138 135 - 145 mmol/L   Potassium 5.0 3.5 - 5.1 mmol/L   Chloride 104 101 - 111 mmol/L   CO2 26 22 - 32 mmol/L   Glucose, Bld 93 65 - 99 mg/dL   BUN 13 6 - 20 mg/dL   Creatinine, Ser 0.84 0.61 - 1.24 mg/dL   Calcium 10.0 8.9 - 10.3 mg/dL   Total Protein 7.3 6.5 - 8.1 g/dL   Albumin 4.7 3.5 - 5.0 g/dL   AST  28 15 - 41 U/L   ALT 20 17 - 63 U/L   Alkaline Phosphatase 42 38 - 126 U/L   Total Bilirubin 0.7 0.3 - 1.2 mg/dL   GFR calc non Af Amer >60 >60 mL/min   GFR calc Af Amer >60 >60 mL/min    Comment: (NOTE) The eGFR has been calculated using the CKD EPI equation. This calculation has not been validated in all clinical situations. eGFR's persistently <60 mL/min signify possible Chronic Kidney Disease.    Anion gap 8 5 - 15  Urinalysis, Routine w reflex microscopic     Status: Abnormal   Collection Time: 01/31/16 11:10 AM  Result Value Ref Range   Color, Urine YELLOW YELLOW   APPearance CLOUDY (A) CLEAR   Specific Gravity, Urine 1.025 1.005 - 1.030   pH 7.5 5.0 - 8.0  Glucose, UA NEGATIVE NEGATIVE mg/dL   Hgb urine dipstick NEGATIVE NEGATIVE   Bilirubin Urine NEGATIVE NEGATIVE   Ketones, ur NEGATIVE NEGATIVE mg/dL   Protein, ur NEGATIVE NEGATIVE mg/dL   Nitrite NEGATIVE NEGATIVE   Leukocytes, UA NEGATIVE NEGATIVE    Comment: MICROSCOPIC NOT DONE ON URINES WITH NEGATIVE PROTEIN, BLOOD, LEUKOCYTES, NITRITE, OR GLUCOSE <1000 mg/dL.  CBC w/Diff     Status: None   Collection Time: 02/02/16  7:09 AM  Result Value Ref Range   WBC 6.1 4.5 - 10.5 K/uL   RBC 5.01 4.22 - 5.81 Mil/uL   Hemoglobin 15.4 13.0 - 17.0 g/dL   HCT 44.5 39.0 - 52.0 %   MCV 88.7 78.0 - 100.0 fl   MCHC 34.6 30.0 - 36.0 g/dL   RDW 13.1 11.5 - 14.6 %   Platelets 208.0 150.0 - 400.0 K/uL   Neutrophils Relative % 61.5 43.0 - 77.0 %   Lymphocytes Relative 27.6 12.0 - 46.0 %   Monocytes Relative 9.3 3.0 - 12.0 %   Eosinophils Relative 1.2 0.0 - 5.0 %   Basophils Relative 0.4 0.0 - 3.0 %   Neutro Abs 3.7 1.4 - 7.7 K/uL   Lymphs Abs 1.7 0.7 - 4.0 K/uL   Monocytes Absolute 0.6 0.1 - 1.0 K/uL   Eosinophils Absolute 0.1 0.0 - 0.7 K/uL   Basophils Absolute 0.0 0.0 - 0.1 K/uL  Comprehensive metabolic panel     Status: Abnormal   Collection Time: 02/02/16  8:03 PM  Result Value Ref Range   Sodium 140 135 - 145 mmol/L    Potassium 4.0 3.5 - 5.1 mmol/L   Chloride 103 101 - 111 mmol/L   CO2 26 22 - 32 mmol/L   Glucose, Bld 116 (H) 65 - 99 mg/dL   BUN 12 6 - 20 mg/dL   Creatinine, Ser 0.76 0.61 - 1.24 mg/dL   Calcium 9.5 8.9 - 10.3 mg/dL   Total Protein 7.2 6.5 - 8.1 g/dL   Albumin 4.7 3.5 - 5.0 g/dL   AST 23 15 - 41 U/L   ALT 18 17 - 63 U/L   Alkaline Phosphatase 42 38 - 126 U/L   Total Bilirubin 0.5 0.3 - 1.2 mg/dL   GFR calc non Af Amer >60 >60 mL/min   GFR calc Af Amer >60 >60 mL/min    Comment: (NOTE) The eGFR has been calculated using the CKD EPI equation. This calculation has not been validated in all clinical situations. eGFR's persistently <60 mL/min signify possible Chronic Kidney Disease.    Anion gap 11 5 - 15  CBC w/Diff     Status: None   Collection Time: 02/06/16 10:34 AM  Result Value Ref Range   WBC 5.8 4.5 - 10.5 K/uL   RBC 5.17 4.22 - 5.81 Mil/uL   Hemoglobin 15.9 13.0 - 17.0 g/dL   HCT 46.0 39.0 - 52.0 %   MCV 88.9 78.0 - 100.0 fl   MCHC 34.7 30.0 - 36.0 g/dL   RDW 12.9 11.5 - 14.6 %   Platelets 212.0 150.0 - 400.0 K/uL   Neutrophils Relative % 58.8 43.0 - 77.0 %   Lymphocytes Relative 28.9 12.0 - 46.0 %   Monocytes Relative 11.1 3.0 - 12.0 %   Eosinophils Relative 0.8 0.0 - 5.0 %   Basophils Relative 0.4 0.0 - 3.0 %   Neutro Abs 3.4 1.4 - 7.7 K/uL   Lymphs Abs 1.7 0.7 - 4.0 K/uL   Monocytes Absolute 0.6 0.1 - 1.0  K/uL   Eosinophils Absolute 0.0 0.0 - 0.7 K/uL   Basophils Absolute 0.0 0.0 - 0.1 K/uL  Basic Metabolic Panel (BMET)     Status: None   Collection Time: 02/06/16 10:34 AM  Result Value Ref Range   Sodium 138 135 - 145 mEq/L   Potassium 4.2 3.5 - 5.1 mEq/L   Chloride 101 96 - 112 mEq/L   CO2 29 19 - 32 mEq/L   Glucose, Bld 90 70 - 99 mg/dL   BUN 14 6 - 23 mg/dL   Creatinine, Ser 0.84 0.40 - 1.50 mg/dL   Calcium 10.4 8.4 - 10.5 mg/dL   GFR 123.06 >60.00 mL/min  Sed Rate (ESR)     Status: None   Collection Time: 02/06/16 10:34 AM  Result Value Ref Range    Sed Rate 3 0 - 22 mm/hr  CBC w/Diff     Status: None   Collection Time: 02/16/16  8:33 AM  Result Value Ref Range   WBC 6.5 4.5 - 10.5 K/uL   RBC 5.04 4.22 - 5.81 Mil/uL   Hemoglobin 15.6 13.0 - 17.0 g/dL   HCT 45.1 39.0 - 52.0 %   MCV 89.4 78.0 - 100.0 fl   MCHC 34.7 30.0 - 36.0 g/dL   RDW 13.1 11.5 - 14.6 %   Platelets 235.0 150.0 - 400.0 K/uL   Neutrophils Relative % 60.5 43.0 - 77.0 %   Lymphocytes Relative 29.3 12.0 - 46.0 %   Monocytes Relative 8.6 3.0 - 12.0 %   Eosinophils Relative 1.2 0.0 - 5.0 %   Basophils Relative 0.4 0.0 - 3.0 %   Neutro Abs 3.9 1.4 - 7.7 K/uL   Lymphs Abs 1.9 0.7 - 4.0 K/uL   Monocytes Absolute 0.6 0.1 - 1.0 K/uL   Eosinophils Absolute 0.1 0.0 - 0.7 K/uL   Basophils Absolute 0.0 0.0 - 0.1 K/uL    Assessment/Plan: 1. Constipation, unspecified constipation type X-ray negative for SBO. Reveals large stool burden. Constipation bowel regimen given for patient to follow. - DG Abd 1 View; Future  2. Generalized abdominal pain Exacerbated by constipation. Again negative prior CT. X-ray with large stool burden. Will consider [place] while waiting on endoscopy. Diet reviewed. Medications updated. ER precautions given. - DG Abd 1 View; Future

## 2016-03-05 ENCOUNTER — Telehealth: Payer: Self-pay | Admitting: Physician Assistant

## 2016-03-05 NOTE — Telephone Encounter (Signed)
Caller name: Self  Can be reached: [phone]  Reason for call: Patient request an order for an Abd US as requested by Imaging following an X-Ray ordered by Malva Coganody Martin. Imaging request patient have US done at [facility].

## 2016-03-06 ENCOUNTER — Other Ambulatory Visit: Payer: Self-pay | Admitting: Hematology & Oncology

## 2016-03-06 ENCOUNTER — Ambulatory Visit (HOSPITAL_BASED_OUTPATIENT_CLINIC_OR_DEPARTMENT_OTHER)
Admission: RE | Admit: 2016-03-06 | Discharge: 2016-03-06 | Disposition: A | Discharge status: Home / Self Care | Payer: 59 | Source: Ambulatory Visit | Attending: Physician Assistant | Admitting: Physician Assistant

## 2016-03-06 ENCOUNTER — Ambulatory Visit (INDEPENDENT_AMBULATORY_CARE_PROVIDER_SITE_OTHER): Payer: 59 | Admitting: Physician Assistant

## 2016-03-06 ENCOUNTER — Encounter: Payer: Self-pay | Admitting: Physician Assistant

## 2016-03-06 ENCOUNTER — Other Ambulatory Visit: Payer: Self-pay | Admitting: Physician Assistant

## 2016-03-06 VITALS — BP 122/82 | HR 99 | Temp 98.5°F | Resp 16 | Ht 62.0 in | Wt 143.4 lb

## 2016-03-06 DIAGNOSIS — R112 Nausea with vomiting, unspecified: Secondary | ICD-10-CM | POA: Diagnosis not present

## 2016-03-06 DIAGNOSIS — J069 Acute upper respiratory infection, unspecified: Secondary | ICD-10-CM

## 2016-03-06 DIAGNOSIS — R109 Unspecified abdominal pain: Secondary | ICD-10-CM | POA: Diagnosis not present

## 2016-03-06 DIAGNOSIS — G8929 Other chronic pain: Secondary | ICD-10-CM

## 2016-03-06 DIAGNOSIS — R110 Nausea: Secondary | ICD-10-CM | POA: Diagnosis not present

## 2016-03-06 DIAGNOSIS — B9789 Other viral agents as the cause of diseases classified elsewhere: Principal | ICD-10-CM

## 2016-03-06 MED ORDER — BENZONATATE 100 MG PO CAPS
100.0000 mg | ORAL_CAPSULE | Freq: Three times a day (TID) | ORAL | Status: DC | PRN
Start: 2016-03-06 — End: 2016-04-25

## 2016-03-06 MED ORDER — FLUTICASONE PROPIONATE 50 MCG/ACT NA SUSP: 2.0000 | Freq: Every day | NASAL | Status: DC

## 2016-03-06 NOTE — Progress Notes (Signed)
Patient presents to clinic today c/o 5 days of chest congestion and dry cough. Endorses 3-4 days of aches and chills. Notes sore throat with PND. Denies fever. Denies recent travel or sick contact. Has been using throat lozenges and cough syrup to help with symptoms.  Past Medical History  Diagnosis Date  . Physical growth delay   . ADHD (attention deficit hyperactivity disorder)   . Goiter   . Hypothyroidism, acquired, autoimmune   . Thyroiditis, autoimmune   . Isosexual precocity   . Fatigue   . Iron excess     Current Outpatient Prescriptions on File Prior to Visit  Medication Sig Dispense Refill  . omeprazole (PRILOSEC) 20 MG capsule Take 1 capsule (20 mg total) by mouth daily. 30 capsule 3  . ondansetron (ZOFRAN) 4 MG tablet Take 1 tablet (4 mg total) by mouth every 6 (six) hours. 12 tablet 0  . SYNTHROID 25 MCG tablet Take 1 tablet (25 mcg total) by mouth daily before breakfast. 30 tablet 5   No current facility-administered medications on file prior to visit.    No Known Allergies  Family History  Problem Relation Age of Onset  . Thyroid disease Maternal Grandmother   . Heart disease Maternal Grandmother   . Diabetes Neg Hx   . Thyroid disease Paternal Aunt   . Heart disease Paternal Grandfather     Social History   Social History  . Marital Status: Single    Spouse Name: N/A  . Number of Children: N/A  . Years of Education: N/A   Social History Main Topics  . Smoking status: Former Smoker -- 0.00 packs/day    Start date: 01/11/2012  . Smokeless tobacco: Never Used  . Alcohol Use: No  . Drug Use: Yes    Special: "Crack" cocaine, Other-see comments, Marijuana  . Sexual Activity: Not Asked   Other Topics Concern  . None   Social History Narrative   Lives with foster parents. Mom still has guardianship.  [employer].    Review of Systems - See HPI.  All other ROS are negative.  BP 122/82 mmHg  Pulse 99  Temp(Src) 98.5 F (36.9 C)  (Oral)  Resp 16  Ht '5\' 2"'  (1.575 m)  Wt 143 lb 6 oz (65.034 kg)  BMI 26.22 kg/m2  SpO2 98%  Physical Exam  Constitutional: He is oriented to person, place, and time and well-developed, well-nourished, and in no distress.  HENT:  Head: Normocephalic and atraumatic.  Right Ear: External ear normal.  Left Ear: External ear normal.  Nose: Nose normal.  Mouth/Throat: Oropharynx is clear and moist. No oropharyngeal exudate.  TM within normal limits bilaterally  Eyes: Conjunctivae are normal.  Neck: Neck supple.  Cardiovascular: Normal rate, regular rhythm, normal heart sounds and intact distal pulses.   Pulmonary/Chest: Effort normal and breath sounds normal. No respiratory distress. He has no wheezes. He has no rales. He exhibits no tenderness.  Neurological: He is alert and oriented to person, place, and time.  Skin: Skin is warm and dry. No rash noted.  Psychiatric: Affect normal.  Vitals reviewed.   Recent Results (from the past 2160 hour(s))  TSH     Status: None   Collection Time: 01/27/16  9:05 PM  Result Value Ref Range   TSH 3.601 0.350 - 4.500 uIU/mL    Comment: Performed at [facility]  T4     Status: None   Collection Time: 01/27/16  9:05 PM  Result  Value Ref Range   T4, Total 7.2 4.5 - 12.0 ug/dL    Comment: (NOTE) Performed At: [facility] [place], [place] [id number] Lindon Romp MD [id number]   Lipase, blood     Status: None   Collection Time: 01/27/16  9:05 PM  Result Value Ref Range   Lipase 14 11 - 51 U/L  Basic metabolic panel     Status: Abnormal   Collection Time: 01/27/16  9:05 PM  Result Value Ref Range   Sodium 139 135 - 145 mmol/L   Potassium 3.6 3.5 - 5.1 mmol/L   Chloride 105 101 - 111 mmol/L   CO2 23 22 - 32 mmol/L   Glucose, Bld 100 (H) 65 - 99 mg/dL   BUN 12 6 - 20 mg/dL   Creatinine, Ser 0.88 0.61 - 1.24 mg/dL   Calcium 9.6 8.9 - 10.3 mg/dL   GFR calc non Af Amer >60 >60 mL/min   GFR calc Af Amer  >60 >60 mL/min    Comment: (NOTE) The eGFR has been calculated using the CKD EPI equation. This calculation has not been validated in all clinical situations. eGFR's persistently <60 mL/min signify possible Chronic Kidney Disease.    Anion gap 11 5 - 15  CBC with Differential/Platelet     Status: Abnormal   Collection Time: 01/27/16  9:05 PM  Result Value Ref Range   WBC 7.1 4.0 - 10.5 K/uL   RBC 4.88 4.22 - 5.81 MIL/uL   Hemoglobin 15.4 13.0 - 17.0 g/dL   HCT 42.0 39.0 - 52.0 %   MCV 86.1 78.0 - 100.0 fL   MCH 31.6 26.0 - 34.0 pg   MCHC 36.7 (H) 30.0 - 36.0 g/dL   RDW 12.1 11.5 - 15.5 %   Platelets 210 150 - 400 K/uL   Neutrophils Relative % 60 %   Neutro Abs 4.3 1.7 - 7.7 K/uL   Lymphocytes Relative 29 %   Lymphs Abs 2.1 0.7 - 4.0 K/uL   Monocytes Relative 10 %   Monocytes Absolute 0.7 0.1 - 1.0 K/uL   Eosinophils Relative 1 %   Eosinophils Absolute 0.1 0.0 - 0.7 K/uL   Basophils Relative 0 %   Basophils Absolute 0.0 0.0 - 0.1 K/uL  H. pylori antibody, IgG     Status: None   Collection Time: 01/30/16 10:23 AM  Result Value Ref Range   H Pylori IgG Negative Negative  Comp Met (CMET)     Status: Abnormal   Collection Time: 01/30/16 10:23 AM  Result Value Ref Range   Sodium 139 135 - 145 mEq/L   Potassium 4.0 3.5 - 5.1 mEq/L   Chloride 102 96 - 112 mEq/L   CO2 31 19 - 32 mEq/L   Glucose, Bld 104 (H) 70 - 99 mg/dL   BUN 14 6 - 23 mg/dL   Creatinine, Ser 0.88 0.40 - 1.50 mg/dL   Total Bilirubin 0.3 0.2 - 1.2 mg/dL   Alkaline Phosphatase 42 39 - 117 U/L   AST 17 0 - 37 U/L   ALT 19 0 - 53 U/L   Total Protein 7.2 6.0 - 8.3 g/dL   Albumin 4.6 3.5 - 5.2 g/dL   Calcium 10.6 (H) 8.4 - 10.5 mg/dL   GFR 116.65 >60.00 mL/min  CBC     Status: None   Collection Time: 01/30/16 10:23 AM  Result Value Ref Range   WBC 7.4 4.5 - 10.5 K/uL   RBC 4.98 4.22 -  5.81 Mil/uL   Platelets 220.0 150.0 - 400.0 K/uL   Hemoglobin 15.4 13.0 - 17.0 g/dL   HCT 44.1 39.0 - 52.0 %   MCV 88.5  78.0 - 100.0 fl   MCHC 35.0 30.0 - 36.0 g/dL   RDW 12.7 11.5 - 14.6 %  CBC with Differential     Status: Abnormal   Collection Time: 01/31/16  9:26 AM  Result Value Ref Range   WBC 4.3 4.0 - 10.5 K/uL   RBC 4.99 4.22 - 5.81 MIL/uL   Hemoglobin 15.6 13.0 - 17.0 g/dL   HCT 42.8 39.0 - 52.0 %   MCV 85.8 78.0 - 100.0 fL   MCH 31.3 26.0 - 34.0 pg   MCHC 36.4 (H) 30.0 - 36.0 g/dL   RDW 12.4 11.5 - 15.5 %   Platelets 24 (LL) 150 - 400 K/uL    Comment: SPECIMEN CHECKED FOR CLOTS REPEATED TO VERIFY PLATELET COUNT CONFIRMED BY SMEAR CRITICAL RESULT CALLED TO, READ BACK BY AND VERIFIED WITH: S.BINGHAM RN [id number] A.QUIZON    Neutrophils Relative % 60 %   Neutro Abs 2.6 1.7 - 7.7 K/uL   Lymphocytes Relative 29 %   Lymphs Abs 1.2 0.7 - 4.0 K/uL   Monocytes Relative 11 %   Monocytes Absolute 0.5 0.1 - 1.0 K/uL   Eosinophils Relative 1 %   Eosinophils Absolute 0.0 0.0 - 0.7 K/uL   Basophils Relative 0 %   Basophils Absolute 0.0 0.0 - 0.1 K/uL  Lipase, blood     Status: None   Collection Time: 01/31/16  9:26 AM  Result Value Ref Range   Lipase 34 11 - 51 U/L  Comprehensive metabolic panel     Status: None   Collection Time: 01/31/16  9:26 AM  Result Value Ref Range   Sodium 138 135 - 145 mmol/L   Potassium 5.0 3.5 - 5.1 mmol/L   Chloride 104 101 - 111 mmol/L   CO2 26 22 - 32 mmol/L   Glucose, Bld 93 65 - 99 mg/dL   BUN 13 6 - 20 mg/dL   Creatinine, Ser 0.84 0.61 - 1.24 mg/dL   Calcium 10.0 8.9 - 10.3 mg/dL   Total Protein 7.3 6.5 - 8.1 g/dL   Albumin 4.7 3.5 - 5.0 g/dL   AST 28 15 - 41 U/L   ALT 20 17 - 63 U/L   Alkaline Phosphatase 42 38 - 126 U/L   Total Bilirubin 0.7 0.3 - 1.2 mg/dL   GFR calc non Af Amer >60 >60 mL/min   GFR calc Af Amer >60 >60 mL/min    Comment: (NOTE) The eGFR has been calculated using the CKD EPI equation. This calculation has not been validated in all clinical situations. eGFR's persistently <60 mL/min signify possible Chronic Kidney Disease.      Anion gap 8 5 - 15  Urinalysis, Routine w reflex microscopic     Status: Abnormal   Collection Time: 01/31/16 11:10 AM  Result Value Ref Range   Color, Urine YELLOW YELLOW   APPearance CLOUDY (A) CLEAR   Specific Gravity, Urine 1.025 1.005 - 1.030   pH 7.5 5.0 - 8.0   Glucose, UA NEGATIVE NEGATIVE mg/dL   Hgb urine dipstick NEGATIVE NEGATIVE   Bilirubin Urine NEGATIVE NEGATIVE   Ketones, ur NEGATIVE NEGATIVE mg/dL   Protein, ur NEGATIVE NEGATIVE mg/dL   Nitrite NEGATIVE NEGATIVE   Leukocytes, UA NEGATIVE NEGATIVE    Comment: MICROSCOPIC NOT DONE ON URINES WITH NEGATIVE PROTEIN, BLOOD,  LEUKOCYTES, NITRITE, OR GLUCOSE <1000 mg/dL.  CBC w/Diff     Status: None   Collection Time: 02/02/16  7:09 AM  Result Value Ref Range   WBC 6.1 4.5 - 10.5 K/uL   RBC 5.01 4.22 - 5.81 Mil/uL   Hemoglobin 15.4 13.0 - 17.0 g/dL   HCT 44.5 39.0 - 52.0 %   MCV 88.7 78.0 - 100.0 fl   MCHC 34.6 30.0 - 36.0 g/dL   RDW 13.1 11.5 - 14.6 %   Platelets 208.0 150.0 - 400.0 K/uL   Neutrophils Relative % 61.5 43.0 - 77.0 %   Lymphocytes Relative 27.6 12.0 - 46.0 %   Monocytes Relative 9.3 3.0 - 12.0 %   Eosinophils Relative 1.2 0.0 - 5.0 %   Basophils Relative 0.4 0.0 - 3.0 %   Neutro Abs 3.7 1.4 - 7.7 K/uL   Lymphs Abs 1.7 0.7 - 4.0 K/uL   Monocytes Absolute 0.6 0.1 - 1.0 K/uL   Eosinophils Absolute 0.1 0.0 - 0.7 K/uL   Basophils Absolute 0.0 0.0 - 0.1 K/uL  Comprehensive metabolic panel     Status: Abnormal   Collection Time: 02/02/16  8:03 PM  Result Value Ref Range   Sodium 140 135 - 145 mmol/L   Potassium 4.0 3.5 - 5.1 mmol/L   Chloride 103 101 - 111 mmol/L   CO2 26 22 - 32 mmol/L   Glucose, Bld 116 (H) 65 - 99 mg/dL   BUN 12 6 - 20 mg/dL   Creatinine, Ser 0.76 0.61 - 1.24 mg/dL   Calcium 9.5 8.9 - 10.3 mg/dL   Total Protein 7.2 6.5 - 8.1 g/dL   Albumin 4.7 3.5 - 5.0 g/dL   AST 23 15 - 41 U/L   ALT 18 17 - 63 U/L   Alkaline Phosphatase 42 38 - 126 U/L   Total Bilirubin 0.5 0.3 - 1.2 mg/dL    GFR calc non Af Amer >60 >60 mL/min   GFR calc Af Amer >60 >60 mL/min    Comment: (NOTE) The eGFR has been calculated using the CKD EPI equation. This calculation has not been validated in all clinical situations. eGFR's persistently <60 mL/min signify possible Chronic Kidney Disease.    Anion gap 11 5 - 15  CBC w/Diff     Status: None   Collection Time: 02/06/16 10:34 AM  Result Value Ref Range   WBC 5.8 4.5 - 10.5 K/uL   RBC 5.17 4.22 - 5.81 Mil/uL   Hemoglobin 15.9 13.0 - 17.0 g/dL   HCT 46.0 39.0 - 52.0 %   MCV 88.9 78.0 - 100.0 fl   MCHC 34.7 30.0 - 36.0 g/dL   RDW 12.9 11.5 - 14.6 %   Platelets 212.0 150.0 - 400.0 K/uL   Neutrophils Relative % 58.8 43.0 - 77.0 %   Lymphocytes Relative 28.9 12.0 - 46.0 %   Monocytes Relative 11.1 3.0 - 12.0 %   Eosinophils Relative 0.8 0.0 - 5.0 %   Basophils Relative 0.4 0.0 - 3.0 %   Neutro Abs 3.4 1.4 - 7.7 K/uL   Lymphs Abs 1.7 0.7 - 4.0 K/uL   Monocytes Absolute 0.6 0.1 - 1.0 K/uL   Eosinophils Absolute 0.0 0.0 - 0.7 K/uL   Basophils Absolute 0.0 0.0 - 0.1 K/uL  Basic Metabolic Panel (BMET)     Status: None   Collection Time: 02/06/16 10:34 AM  Result Value Ref Range   Sodium 138 135 - 145 mEq/L   Potassium 4.2 3.5 -  5.1 mEq/L   Chloride 101 96 - 112 mEq/L   CO2 29 19 - 32 mEq/L   Glucose, Bld 90 70 - 99 mg/dL   BUN 14 6 - 23 mg/dL   Creatinine, Ser 0.84 0.40 - 1.50 mg/dL   Calcium 10.4 8.4 - 10.5 mg/dL   GFR 123.06 >60.00 mL/min  Sed Rate (ESR)     Status: None   Collection Time: 02/06/16 10:34 AM  Result Value Ref Range   Sed Rate 3 0 - 22 mm/hr  CBC w/Diff     Status: None   Collection Time: 02/16/16  8:33 AM  Result Value Ref Range   WBC 6.5 4.5 - 10.5 K/uL   RBC 5.04 4.22 - 5.81 Mil/uL   Hemoglobin 15.6 13.0 - 17.0 g/dL   HCT 45.1 39.0 - 52.0 %   MCV 89.4 78.0 - 100.0 fl   MCHC 34.7 30.0 - 36.0 g/dL   RDW 13.1 11.5 - 14.6 %   Platelets 235.0 150.0 - 400.0 K/uL   Neutrophils Relative % 60.5 43.0 - 77.0 %    Lymphocytes Relative 29.3 12.0 - 46.0 %   Monocytes Relative 8.6 3.0 - 12.0 %   Eosinophils Relative 1.2 0.0 - 5.0 %   Basophils Relative 0.4 0.0 - 3.0 %   Neutro Abs 3.9 1.4 - 7.7 K/uL   Lymphs Abs 1.9 0.7 - 4.0 K/uL   Monocytes Absolute 0.6 0.1 - 1.0 K/uL   Eosinophils Absolute 0.1 0.0 - 0.7 K/uL   Basophils Absolute 0.0 0.0 - 0.1 K/uL   Assessment/Plan: 1. Viral URI with cough Exam looks good. No sign of bacterial infection. Supportive measures reviewed. Will begin Tessalon for cough and Flonase. OTC medications reviewed. FU PRN.  - benzonatate (TESSALON) 100 MG capsule; Take 1 capsule (100 mg total) by mouth 3 (three) times daily as needed.  Dispense: 30 capsule; Refill: 0 - fluticasone (FLONASE) 50 MCG/ACT nasal spray; Place 2 sprays into both nostrils daily.  Dispense: 16 g; Refill: 6   Leeanne Rio, [place]

## 2016-03-06 NOTE — Progress Notes (Signed)
Pre visit review using our clinic review tool, if applicable. No additional management support is needed unless otherwise documented below in the visit note/SLS  

## 2016-03-06 NOTE — Patient Instructions (Signed)
Please increase fluids. Rest when you can. Tylenol for aches and throat pain. Take the Tessalon as directed for cough. You will also benefit from some plain Mucinex twice daily. Start an echinacea supplement to help fight off viral infections.  Please go downstairs to schedule your ultrasound. I will try to call GI to see what the delay is.  Upper Respiratory Infection, Adult Most upper respiratory infections (URIs) are caused by a virus. A URI affects the nose, throat, and upper air passages. The most common type of URI is often called "the common cold." HOME CARE   Take medicines only as told by your doctor.  Gargle warm saltwater or take cough drops to comfort your throat as told by your doctor.  Use a warm mist humidifier or inhale steam from a shower to increase air moisture. This may make it easier to breathe.  Drink enough fluid to keep your pee (urine) clear or pale yellow.  Eat soups and other clear broths.  Have a healthy diet.  Rest as needed.  Go back to work when your fever is gone or your doctor says it is okay.  You may need to stay home longer to avoid giving your URI to others.  You can also wear a face mask and wash your hands often to prevent spread of the virus.  Use your inhaler more if you have asthma.  Do not use any tobacco products, including cigarettes, chewing tobacco, or electronic cigarettes. If you need help quitting, ask your doctor. GET HELP IF:  You are getting worse, not better.  Your symptoms are not helped by medicine.  You have chills.  You are getting more short of breath.  You have brown or red mucus.  You have yellow or brown discharge from your nose.  You have pain in your face, especially when you bend forward.  You have a fever.  You have puffy (swollen) neck glands.  You have pain while swallowing.  You have white areas in the back of your throat. GET HELP RIGHT AWAY IF:   You have very bad or  constant:  Headache.  Ear pain.  Pain in your forehead, behind your eyes, and over your cheekbones (sinus pain).  Chest pain.  You have long-lasting (chronic) lung disease and any of the following:  Wheezing.  Long-lasting cough.  Coughing up blood.  A change in your usual mucus.  You have a stiff neck.  You have changes in your:  Vision.  Hearing.  Thinking.  Mood. MAKE SURE YOU:   Understand these instructions.  Will watch your condition.  Will get help right away if you are not doing well or get worse.   This information is not intended to replace advice given to you by your health care provider. Make sure you discuss any questions you have with your health care provider.   Document Released: 03/11/2008 Document Revised: 02/07/2015 Document Reviewed: 12/29/2013 Elsevier Interactive Patient Education [employer]2016 [employer]vier Inc.

## 2016-03-06 NOTE — Telephone Encounter (Signed)
Called and spoke with the pt and informed him that he will not need a referral for the Abd US.  Per [name]-when he sees the GI doctor he is to let the GI doctor know that [name] had recommended a US of his Abd and see if the GI doctor feels it needs to be done.  Pt verbalized understanding.  Pt states he has an appt with Selena Battenody today and he will discuss this more with him at the appt.//AB/CMA

## 2016-03-12 ENCOUNTER — Ambulatory Visit (AMBULATORY_SURGERY_CENTER): Payer: Self-pay

## 2016-03-12 VITALS — Ht 64.0 in | Wt 145.8 lb

## 2016-03-12 DIAGNOSIS — R109 Unspecified abdominal pain: Secondary | ICD-10-CM

## 2016-03-12 DIAGNOSIS — R112 Nausea with vomiting, unspecified: Secondary | ICD-10-CM

## 2016-03-12 NOTE — Progress Notes (Signed)
Per pt, no allergies to soy or egg products.Pt not taking any weight loss meds or using  O2 at home. 

## 2016-03-15 ENCOUNTER — Ambulatory Visit (AMBULATORY_SURGERY_CENTER): Payer: Self-pay | Admitting: *Deleted

## 2016-03-15 ENCOUNTER — Ambulatory Visit: Payer: 59 | Admitting: Gastroenterology

## 2016-03-15 ENCOUNTER — Telehealth: Payer: Self-pay | Admitting: Gastroenterology

## 2016-03-15 ENCOUNTER — Telehealth: Payer: Self-pay | Admitting: *Deleted

## 2016-03-15 VITALS — Ht 64.0 in | Wt 145.0 lb

## 2016-03-15 DIAGNOSIS — R1084 Generalized abdominal pain: Secondary | ICD-10-CM

## 2016-03-15 MED ORDER — SODIUM CHLORIDE 0.9 % IV SOLN: 500.0000 mL | INTRAVENOUS | Status: DC

## 2016-03-15 NOTE — Progress Notes (Signed)
No egg or soy allergy known to patient  No issues with past sedation with any surgeries  or procedures, no intubation problems  No diet pills per patient No home 02 use per patient  No blood thinners per patient  Pt denies issues with constipation   

## 2016-03-15 NOTE — Telephone Encounter (Signed)
This patient showed up for EGD today, and had not followed diet instructions.  He ate a hamburger at 11AM. Please charge a cancellation fee, since he could not have his procedure and the slot therefore could not be used. When charge is settled, he can reschedule the EGD.

## 2016-03-15 NOTE — Telephone Encounter (Signed)
Encounter for admission for Endoscopy closed prior to completing progress note. Patient stated he had a cheeseburger at 11:30. Stating he was instructed he could have solids before 12 noon today. Printed instructions from previsit and reviewed with the patient. Patient argumentive that he needs his procedure today, otherwise he will not have a ride to procedure. Discussed NPO status with Dr. Myrtie Neitheranis and Fanny DanceJanet Mullins CRNA. Procedure cancelled. Procedure and previsit rescheduled. Carepartner, patient's grandmother , aware of reasons for cancellation and agreed to bring patient back for rescheduled appointment.

## 2016-03-19 ENCOUNTER — Ambulatory Visit (AMBULATORY_SURGERY_CENTER): Payer: 59 | Admitting: Gastroenterology

## 2016-03-19 ENCOUNTER — Encounter: Payer: Self-pay | Admitting: Gastroenterology

## 2016-03-19 ENCOUNTER — Telehealth: Payer: Self-pay | Admitting: Behavioral Health

## 2016-03-19 VITALS — BP 93/59 | HR 76 | Resp 13 | Ht 64.0 in | Wt 145.0 lb

## 2016-03-19 DIAGNOSIS — E039 Hypothyroidism, unspecified: Secondary | ICD-10-CM | POA: Diagnosis not present

## 2016-03-19 DIAGNOSIS — K219 Gastro-esophageal reflux disease without esophagitis: Secondary | ICD-10-CM | POA: Diagnosis not present

## 2016-03-19 DIAGNOSIS — R1084 Generalized abdominal pain: Secondary | ICD-10-CM | POA: Diagnosis not present

## 2016-03-19 DIAGNOSIS — R109 Unspecified abdominal pain: Secondary | ICD-10-CM | POA: Diagnosis not present

## 2016-03-19 DIAGNOSIS — G43A0 Cyclical vomiting, not intractable: Secondary | ICD-10-CM

## 2016-03-19 DIAGNOSIS — R1115 Cyclical vomiting syndrome unrelated to migraine: Secondary | ICD-10-CM

## 2016-03-19 HISTORY — PX: ESOPHAGOGASTRODUODENOSCOPY: SHX1529

## 2016-03-19 MED ORDER — SODIUM CHLORIDE 0.9 % IV SOLN: 500.0000 mL | INTRAVENOUS | Status: DC

## 2016-03-19 NOTE — Patient Instructions (Signed)
Discharge instructions given. Normal exam. Resume previous medications. YOU HAD AN ENDOSCOPIC PROCEDURE TODAY AT THE [facility]:   Refer to the procedure report that was given to you for any specific questions about what was found during the examination.  If the procedure report does not answer your questions, please call your gastroenterologist to clarify.  If you requested that your care partner not be given the details of your procedure findings, then the procedure report has been included in a sealed envelope for you to review at your convenience later.  YOU SHOULD EXPECT: Some feelings of bloating in the abdomen. Passage of more gas than usual.  Walking can help get rid of the air that was put into your GI tract during the procedure and reduce the bloating. If you had a lower endoscopy (such as a colonoscopy or flexible sigmoidoscopy) you may notice spotting of blood in your stool or on the toilet paper. If you underwent a bowel prep for your procedure, you may not have a normal bowel movement for a few days.  Please Note:  You might notice some irritation and congestion in your nose or some drainage.  This is from the oxygen used during your procedure.  There is no need for concern and it should clear up in a day or so.  SYMPTOMS TO REPORT IMMEDIATELY:    Following upper endoscopy (EGD)  Vomiting of blood or coffee ground material  New chest pain or pain under the shoulder blades  Painful or persistently difficult swallowing  New shortness of breath  Fever of 100F or higher  Black, tarry-looking stools  For urgent or emergent issues, a gastroenterologist can be reached at any hour by calling [phone].   DIET: Your first meal following the procedure should be a small meal and then it is ok to progress to your normal diet. Heavy or fried foods are harder to digest and may make you feel nauseous or bloated.  Likewise, meals heavy in dairy and vegetables can increase  bloating.  Drink plenty of fluids but you should avoid alcoholic beverages for 24 hours.  ACTIVITY:  You should plan to take it easy for the rest of today and you should NOT DRIVE or use heavy machinery until tomorrow (because of the sedation medicines used during the test).    FOLLOW UP: Our staff will call the number listed on your records the next business day following your procedure to check on you and address any questions or concerns that you may have regarding the information given to you following your procedure. If we do not reach you, we will leave a message.  However, if you are feeling well and you are not experiencing any problems, there is no need to return our call.  We will assume that you have returned to your regular daily activities without incident.  If any biopsies were taken you will be contacted by phone or by letter within the next 1-3 weeks.  Please call us at [phone] if you have not heard about the biopsies in 3 weeks.    SIGNATURES/CONFIDENTIALITY: You and/or your care partner have signed paperwork which will be entered into your electronic medical record.  These signatures attest to the fact that that the information above on your After Visit Summary has been reviewed and is understood.  Full responsibility of the confidentiality of this discharge information lies with you and/or your care-partner. 

## 2016-03-19 NOTE — Telephone Encounter (Signed)
[facility] note received via fax  Call Date: 03/18/16 Time: 7:19 PM   Caller: [name] (Self) Return number: [phone]  Nurse: Iven FinnHettie Brewner, RN  Chief Complaint: Blood Sugar Low  Reason for call: Caller states he has a lot of pain in his stomach and he had his blood sugar checked and it was 80 before he ate, when he ate it was 125.  Related visit to physician within the last 2 weeks: Yes  Guideline: Abdominal Pain - Male  Disposition: Go to ED Now   Attempted to follow-up with patient regarding the above complaint. Unable to reach patient at this time. Left message for patient to return call when available.

## 2016-03-19 NOTE — Progress Notes (Signed)
Report to PACU, RN, vss, BBS= Clear.  

## 2016-03-19 NOTE — Op Note (Signed)
[facility] Patient Name: Keith Lawrence Procedure Date: 03/19/2016 2:57 PM MRN: [id number] Endoscopist: Sherilyn Cooter L. Myrtie Neither , MD Age: 20 Referring MD:  Date of Birth: October 26, 1995 Gender: Male Procedure:                Upper GI endoscopy Indications:              Generalized abdominal pain, intermittent vomiting Medicines:                Monitored Anesthesia Care Procedure:                Pre-Anesthesia Assessment:                           - Prior to the procedure, a History and Physical                            was performed, and patient medications and                            allergies were reviewed. The patient's tolerance of                            previous anesthesia was also reviewed. The risks                            and benefits of the procedure and the sedation                            options and risks were discussed with the patient.                            All questions were answered, and informed consent                            was obtained. Prior Anticoagulants: The patient has                            taken no previous anticoagulant or antiplatelet                            agents. ASA Grade Assessment: II - A patient with                            mild systemic disease. After reviewing the risks                            and benefits, the patient was deemed in                            satisfactory condition to undergo the procedure.                           After obtaining informed consent, the endoscope was  passed under direct vision. Throughout the                            procedure, the patient's blood pressure, pulse, and                            oxygen saturations were monitored continuously. The                            Model GIF-[id number](SN#[id number]) scope was introduced                            through the mouth, and advanced to the second part                            of duodenum. The upper GI  endoscopy was                            accomplished without difficulty. The patient                            tolerated the procedure well. Scope In: Scope Out: Findings:                 The esophagus was normal.                           The stomach was normal.                           The cardia and gastric fundus were normal on                            retroflexion.                           The examined duodenum was normal. Complications:            No immediate complications. Estimated Blood Loss:     Estimated blood loss: none. Impression:               - Normal esophagus.                           - Normal stomach.                           - Normal examined duodenum.                           - No specimens collected.                           Nothing is seen to explain the patient's symptoms.                            Recent extensive blood work and CT scan of the  abdomen were also normal. Recommendation:           - Patient has a contact number available for                            emergencies. The signs and symptoms of potential                            delayed complications were discussed with the                            patient. Return to normal activities tomorrow.                            Written discharge instructions were provided to the                            patient.                           - Resume previous diet.                           The patient and his primary care provider are                            cautioned against the continued use of narcotic                            pain medication.                           Return to primary care Keith Lawrence L. Myrtie Neither, MD 03/19/2016 3:23:31 PM This report has been signed electronically.

## 2016-03-20 ENCOUNTER — Telehealth: Payer: Self-pay

## 2016-03-20 NOTE — Telephone Encounter (Signed)
  Follow up Call-  Call back number 03/19/2016 03/15/2016  Post procedure Call Back phone  # [phone]  Permission to leave phone message Yes Yes    Patient was called for follow up after his procedure on 03/19/2016. No answer at the number given for follow up phone call. A message was left on his answering machine.

## 2016-03-21 ENCOUNTER — Encounter: Payer: 59 | Admitting: Gastroenterology

## 2016-03-21 ENCOUNTER — Telehealth: Payer: Self-pay | Admitting: Physician Assistant

## 2016-03-21 NOTE — Telephone Encounter (Signed)
Patient Name: Keith Lawrence  DOB: [birth year]    Initial Comment no energy, very few bowel movements, been checking sugar regularly for diabetes and it has been running high BS 99, been running about 145-160    Nurse Assessment  Nurse: Stefano GaulStringer, RN, Dwana CurdVera Date/Time (Eastern Time): 03/21/2016 1:36:04 PM  Confirm and document reason for call. If symptomatic, describe symptoms. You must click the next button to save text entered. ---Caller states he has been checking his blood sugar. It is running 145-160. No energy and he is weak. He has been vomiting occasionally. He vomits at least twice a week. He is very nauseated  Has the patient traveled out of the country within the last 30 days? ---Not Applicable  Does the patient have any new or worsening symptoms? ---Yes  Will a triage be completed? ---Yes  Related visit to physician within the last 2 weeks? ---Yes  Does the PT have any chronic conditions? (i.e. diabetes, asthma, etc.) ---Yes  List chronic conditions. ---thyroid  Is this a behavioral health or substance abuse call? ---No     Guidelines    Guideline Title Affirmed Question Affirmed Notes  Diabetes - High Blood Sugar [1] New onset Diabetes suspected (e.g., frequent urination, weak, weight loss) AND [2] vomiting or rapid breathing    Final Disposition User   Go to ED Now (or PCP triage) Stefano GaulStringer, RN, Vera    Comments  Pt states he can not come to the office today as he does not have a ride and would like appt tomorrow. Advised caller that the office would be notified of er outcome and he does not have a ride and would like appt tomorrow and someone would call him back.  Called back line and spoke to [place] and advised her that pt has been checking blood sugar and it is running 145-160. He is nauseated and very weak. States he has been vomiting at leas 2 times a week.  Pt was advised to go to the ER (or PCP triage) and he states that he can not come to the office within 4 hrs or go to the  ER as he does not have a transportation.   Referrals  GO TO FACILITY REFUSED   Disagree/Comply: Disagree  Disagree/Comply Reason: Unable to find transportation

## 2016-03-21 NOTE — Telephone Encounter (Signed)
He has had chronic nausea and vomiting with full workup including CT abdomen, US abdomen. Is now  followed by GI and just recently had normal upper endoscopy. The endorsed fasting sugars are in a  Normal range as are the non-fasting sugars he is reporting. Will gladly check an A1C when he comes in for visit but I would be surprised if it were abnormal giving the reported sugar levels. So far no reason for nausea or vomiting he is having. Will need further assessment in office but reported sugars do not seem concerning for need for emergent visit. I will gladly see him tomorrow if he can make it to office.   Abnormal fasting sugars are < 60 or > 100. Abnormal non-fasting sugar is > 180. If he were to develop severe nausea or vomiting, fever or chills, or if sugars are < 60 fasting or > 300 non-fasting or fasting he would need to go to the ER.

## 2016-03-21 NOTE — Telephone Encounter (Signed)
Called pt. He is concerned about possible diabetes. He has talked w/ his neighbor who is diabetic. She has been helping him check CBG. Fasting CBGs have been <100, and post-prandial CBGs ranging from 140s-160s as noted below. Pt reports his symptoms are not new and have been going on for 4 months or so. Chart review confirms pt has had intermittent N/V and abdominal pain of unknown cause recently, s/p normal upper endoscopy 03/20/16. He does not have a ride today. I offered to schedule appointment tomorrow, but he stated he needed to check with his ride first. He will call back to schedule an appointment for tomorrow.

## 2016-03-21 NOTE — Telephone Encounter (Signed)
Pt was notified during previous call of normal/abnormal fasting and non-fasting CBGs and verbalized understanding.

## 2016-04-04 ENCOUNTER — Encounter: Payer: Self-pay | Admitting: Medical

## 2016-04-04 ENCOUNTER — Ambulatory Visit (INDEPENDENT_AMBULATORY_CARE_PROVIDER_SITE_OTHER): Payer: 59 | Admitting: Medical

## 2016-04-04 VITALS — BP 100/60 | HR 87 | Temp 98.4°F | Ht 64.0 in | Wt 145.8 lb

## 2016-04-04 DIAGNOSIS — D696 Thrombocytopenia, unspecified: Secondary | ICD-10-CM | POA: Diagnosis not present

## 2016-04-04 DIAGNOSIS — R5383 Other fatigue: Secondary | ICD-10-CM | POA: Diagnosis not present

## 2016-04-04 DIAGNOSIS — R739 Hyperglycemia, unspecified: Secondary | ICD-10-CM

## 2016-04-04 DIAGNOSIS — R079 Chest pain, unspecified: Secondary | ICD-10-CM

## 2016-04-04 DIAGNOSIS — F439 Reaction to severe stress, unspecified: Secondary | ICD-10-CM

## 2016-04-04 DIAGNOSIS — M791 Myalgia, unspecified site: Secondary | ICD-10-CM

## 2016-04-04 DIAGNOSIS — M2550 Pain in unspecified joint: Secondary | ICD-10-CM | POA: Diagnosis not present

## 2016-04-04 DIAGNOSIS — Z658 Other specified problems related to psychosocial circumstances: Secondary | ICD-10-CM

## 2016-04-04 LAB — RHEUMATOID FACTOR

## 2016-04-04 NOTE — Patient Instructions (Signed)
For your various complaints wil get a1-c, cbc, cmp, sed rate and  arthritis panel.  Recommend eating healthy diet. Stop high glycemic index foods. Particularly sodas.  If labs negative consider fibromyalgia and rx of cymbalta.  Also if leg weakness or other condition worsen consider rheumatology or neurology work up.  If a1-c elevated consider metformin rx.  ekg normal today. With your age pain may be muscle or cartlidge. Your pecs were tender on exam. If you get episode of severe pain then ED evaluation.  Follow up in in 2 weeks pcp or myself.

## 2016-04-04 NOTE — Progress Notes (Signed)
Pre visit review using our clinic review tool, if applicable. No additional management support is needed unless otherwise documented below in the visit note. 

## 2016-04-04 NOTE — Progress Notes (Signed)
Subjective:    Patient ID: Keith Lawrence, male    DOB: [birth year], 19 y.o.   MRN: [id number]  HPI  Pt in with mom.  Pt has various concerns. He thinks he may have low thyroid. T4 done earlier in the year was normal.  Pt has some occasional high blood sugars 135, 145 and 131 and 160. Lowest was 168. Some constant hunger.  Pt has history of low platlets in past.  He also has some fatigue. Mild activity will cause.  He has some diffuse myalgias.  For 4 months. Pt also reports some joint pains. Pt has concerns for fibromyalgia. Sometimes muscles feel weak.  Sometimes he will feel bloated as well.  Pt stressed about mom constant health. She is bipolar.   Pt states for about 4 months on and off chest pain. Last time had was yesterday. Lasted all day. Pt belched some yesterday. Pt had egd recently. It was negative. Pt chest wall pain will come and go. Randomly occur. Some faint pectoral pain on palpation today.       Review of Systems  Constitutional: Positive for fatigue. Negative for fever and chills.  HENT: Negative for congestion, ear pain, postnasal drip, sneezing, sore throat and voice change.   Respiratory: Negative for cough, chest tightness, shortness of breath and wheezing.   Cardiovascular: Positive for chest pain. Negative for palpitations.  Gastrointestinal: Positive for abdominal distention. Negative for nausea, vomiting, abdominal pain, diarrhea, blood in stool and rectal pain.  Endocrine: Positive for polyphagia.  Genitourinary: Negative for dysuria and flank pain.  Musculoskeletal: Positive for myalgias and arthralgias. Negative for back pain.  Neurological: Negative for dizziness and headaches.  Hematological: Negative for adenopathy. Does not bruise/bleed easily.  Psychiatric/Behavioral: Negative for behavioral problems and confusion.       Stressed.     Past Medical History  Diagnosis Date  . Physical growth delay   . ADHD (attention deficit hyperactivity  disorder)   . Goiter   . Hypothyroidism, acquired, autoimmune   . Thyroiditis, autoimmune   . Isosexual precocity   . Fatigue   . Iron excess   . SOB (shortness of breath) on exertion   . Nausea and vomiting   . Abdominal pain     all over pain     Social History   Social History  . Marital Status: Single    Spouse Name: N/A  . Number of Children: N/A  . Years of Education: N/A   Occupational History  . Not on file.   Social History Main Topics  . Smoking status: Former Smoker -- 0.00 packs/day    Types: Cigarettes    Start date: 01/11/2012  . Smokeless tobacco: Never Used  . Alcohol Use: No  . Drug Use: Yes    Special: "Crack" cocaine, Other-see comments, Marijuana     Comment: Stopped 5 months ago  . Sexual Activity: Not on file   Other Topics Concern  . Not on file   Social History Narrative   Lives with foster parents. Mom still has guardianship.  [employer].     Past Surgical History  Procedure Laterality Date  . Chalazion excision    . Frenulectomy, lingual    . Tonsillectomy and adenoidectomy      Family History  Problem Relation Age of Onset  . Thyroid disease Maternal Grandmother   . Heart disease Maternal Grandmother   . Diabetes Neg Hx   . Thyroid disease Paternal Aunt   .  Heart disease Paternal Grandfather     No Known Allergies  Current Outpatient Prescriptions on File Prior to Visit  Medication Sig Dispense Refill  . b complex vitamins tablet Take 1 tablet by mouth daily.    . benzonatate (TESSALON) 100 MG capsule Take 1 capsule (100 mg total) by mouth 3 (three) times daily as needed. 30 capsule 0  . fluticasone (FLONASE) 50 MCG/ACT nasal spray Place 2 sprays into both nostrils daily. 16 g 6  . HYDROcodone-acetaminophen (NORCO/VICODIN) 5-325 MG tablet Take 1 tablet by mouth every 4 (four) hours as needed for moderate pain.    . naproxen sodium (ANAPROX) 220 MG tablet Take 220 mg by mouth 2 (two) times daily as needed. Reported  on 03/12/2016    . omeprazole (PRILOSEC) 20 MG capsule Take 1 capsule (20 mg total) by mouth daily. 30 capsule 3  . ondansetron (ZOFRAN) 4 MG tablet Take 1 tablet (4 mg total) by mouth every 6 (six) hours. 12 tablet 0  . SYNTHROID 25 MCG tablet Take 1 tablet (25 mcg total) by mouth daily before breakfast. 30 tablet 5   No current facility-administered medications on file prior to visit.    BP 100/60 mmHg  Pulse 87  Temp(Src) 98.4 F (36.9 C) (Oral)  Ht 5\' 4"  (1.626 m)  Wt 145 lb 12.8 oz (66.134 kg)  BMI 25.01 kg/m2  SpO2 98%       Objective:   Physical Exam  General Mental Status- Alert. General Appearance- Not in acute distress.   Skin General: Color- Normal Color. Moisture- Normal Moisture.  Neck Carotid Arteries- Normal color. Moisture- Normal Moisture. No carotid bruits. No JVD. No thyromegaly.  Chest and Lung Exam Auscultation: Breath Sounds:-Normal.  Cardiovascular Auscultation:Rythm- Regular. Murmurs & Other Heart Sounds:Auscultation of the heart reveals- No Murmurs.  Abdomen Inspection:-Inspeection Normal. Palpation/Percussion:Note:No mass. Palpation and Percussion of the abdomen reveal- Non Tender, Non Distended + BS, no rebound or guarding.  Neurologic Cranial Nerve exam:- CN III-XII intact(No nystagmus), symmetric smile. Strength:- 5/5 equal and symmetric strength both upper and lower extremities.  Back- various points of tenderness all over. Including trapezius muscles.      Assessment & Plan:  For your various complaints wil get a1-c, cbc, cmp, sed rate and  arthritis panel.  Recommend eating healthy diet. Stop high glycemic index foods. Particularly sodas.  If labs negative consider fibromyalgia and rx of cymbalta.  Also if leg weakness or other condition worsen consider rheumatology or neurology work up.  If a1-c elevated consider metformin rx.  ekg normal today. With your age pain may be muscle or cartlidge. Your pecs were tender on exam.  If you get episode of severe pain then ED evaluation.  Follow up in in 2 weeks pcp or myself.

## 2016-04-05 ENCOUNTER — Telehealth: Payer: Self-pay | Admitting: Physician Assistant

## 2016-04-05 LAB — COMPREHENSIVE METABOLIC PANEL
ALK PHOS: 44 U/L (ref 39–117)
ALT: 17 U/L (ref 0–53)
AST: 18 U/L (ref 0–37)
Albumin: 4.7 g/dL (ref 3.5–5.2)
BILIRUBIN TOTAL: 0.4 mg/dL (ref 0.2–1.2)
BUN: 12 mg/dL (ref 6–23)
CO2: 30 mEq/L (ref 19–32)
Calcium: 10.5 mg/dL (ref 8.4–10.5)
Chloride: 104 mEq/L (ref 96–112)
Creatinine, Ser: 0.98 mg/dL (ref 0.40–1.50)
GFR: 102.85 mL/min (ref >60.00)
GLUCOSE: 86 mg/dL (ref 70–99)
Potassium: 3.9 mEq/L (ref 3.5–5.1)
SODIUM: 141 meq/L (ref 135–145)
TOTAL PROTEIN: 7.3 g/dL (ref 6.0–8.3)

## 2016-04-05 LAB — CBC WITH DIFFERENTIAL/PLATELET
BASOS ABS: 0 10*3/uL (ref 0.0–0.1)
Basophils Relative: 0.4 % (ref 0.0–3.0)
Eosinophils Absolute: 0.1 10*3/uL (ref 0.0–0.7)
Eosinophils Relative: 1.1 % (ref 0.0–5.0)
HCT: 42.3 % (ref 39.0–52.0)
HEMOGLOBIN: 14.7 g/dL (ref 13.0–17.0)
LYMPHS ABS: 1.6 10*3/uL (ref 0.7–4.0)
Lymphocytes Relative: 32.7 % (ref 12.0–46.0)
MCHC: 34.8 g/dL (ref 30.0–36.0)
MCV: 88.7 fl (ref 78.0–100.0)
MONO ABS: 0.5 10*3/uL (ref 0.1–1.0)
MONOS PCT: 9.5 % (ref 3.0–12.0)
NEUTROS PCT: 56.3 % (ref 43.0–77.0)
Neutro Abs: 2.7 10*3/uL (ref 1.4–7.7)
Platelets: 204 10*3/uL (ref 150.0–400.0)
RBC: 4.76 Mil/uL (ref 4.22–5.81)
RDW: 13.1 % (ref 11.5–14.6)
WBC: 4.8 10*3/uL (ref 4.5–10.5)

## 2016-04-05 LAB — ANA: ANA: NEGATIVE

## 2016-04-05 LAB — SEDIMENTATION RATE: Sed Rate: 2 mm/hr (ref 0–15)

## 2016-04-05 LAB — HEMOGLOBIN A1C: HEMOGLOBIN A1C: 5 % (ref 4.6–6.5)

## 2016-04-05 NOTE — Telephone Encounter (Signed)
Relation to WG:NFAOpt:self Call back number:[phone] Pharmacy:  Reason for call:  Patient inquiring about lab results. Patient was last seen by Ramon DredgeEdward 04/04/16

## 2016-04-05 NOTE — Telephone Encounter (Signed)
Labs have not been reviewed by provider. Pt will be contacted w/ lab results once reviewed.

## 2016-04-05 NOTE — Telephone Encounter (Signed)
Pt calling in for lab results, insisting on receiving results, particularly sodium, even after being informed by front office staff that labs have not been reviewed by provider. Informed pt of normal sodium level and that we would call him with results once reviewed by Ramon DredgeEdward.

## 2016-04-07 ENCOUNTER — Telehealth: Payer: Self-pay | Admitting: Medical

## 2016-04-07 MED ORDER — DULOXETINE HCL 30 MG PO CPEP: ORAL_CAPSULE | ORAL | Status: DC

## 2016-04-07 NOTE — Telephone Encounter (Signed)
Sent cymbalta rx to pharmacy.

## 2016-04-08 ENCOUNTER — Telehealth: Payer: Self-pay | Admitting: Emergency Medicine

## 2016-04-08 NOTE — Telephone Encounter (Addendum)
Left message on patients chart,medication sent to pharmacy.

## 2016-04-08 NOTE — Telephone Encounter (Signed)
Spoke to pt, results discussed with pt. Pt would like to get a rx for cymbalta.   Even though results were normal pt states that he is still worried.  Pt states that he is still hungry which is getting worse every day.

## 2016-04-08 NOTE — Telephone Encounter (Signed)
-----   Message from Lurline HareKaren E Carter, LPN sent at 1/6/10967/12/2015 10:54 AM EDT -----   ----- Message -----    From: Esperanza RichtersEdward Saguier, PA-C    Sent: 04/07/2016   6:00 PM      To: Willeen NieceHolden S Mabe, CMA  Pt arthritis studies are negative. Average blood sugar is not in diabetic range. Kidney function is normal. No anemia and platelets are normal. I could prescribe cymbalta as discussed on last visit. Pt was speculating he may have fibromyalgia

## 2016-04-08 NOTE — Telephone Encounter (Signed)
Spoke to pt. Lab results given. Pt would like to be prescribed cymbalta.  Pt states that he is still very concerned about fatigue and staying hungry all the time which is getting worse everyday.   Also noticed Cytoplasmic florescent noticed on his result report.

## 2016-04-08 NOTE — Telephone Encounter (Signed)
-----   Message from Karen E Carter, LPN sent at 04/08/2016 10:54 AM EDT -----   ----- Message -----    From: Edward Saguier, PA-C    Sent: 04/07/2016   6:00 PM      To: Holden S Mabe, CMA  Pt arthritis studies are negative. Average blood sugar is not in diabetic range. Kidney function is normal. No anemia and platelets are normal. I could prescribe cymbalta as discussed on last visit. Pt was speculating he may have fibromyalgia 

## 2016-04-08 NOTE — Telephone Encounter (Signed)
Sent cymbalta to his pharmacy

## 2016-04-16 ENCOUNTER — Ambulatory Visit: Payer: 59 | Admitting: Physician Assistant

## 2016-04-25 ENCOUNTER — Encounter: Payer: Self-pay | Admitting: Medical

## 2016-04-25 ENCOUNTER — Ambulatory Visit (INDEPENDENT_AMBULATORY_CARE_PROVIDER_SITE_OTHER): Payer: 59 | Admitting: Medical

## 2016-04-25 VITALS — BP 110/70 | HR 78 | Temp 98.1°F | Ht 64.0 in | Wt 142.0 lb

## 2016-04-25 DIAGNOSIS — M797 Fibromyalgia: Secondary | ICD-10-CM | POA: Diagnosis not present

## 2016-04-25 DIAGNOSIS — R7309 Other abnormal glucose: Secondary | ICD-10-CM | POA: Diagnosis not present

## 2016-04-25 DIAGNOSIS — M609 Myositis, unspecified: Secondary | ICD-10-CM

## 2016-04-25 DIAGNOSIS — IMO0001 Reserved for inherently not codable concepts without codable children: Secondary | ICD-10-CM

## 2016-04-25 DIAGNOSIS — M2550 Pain in unspecified joint: Secondary | ICD-10-CM | POA: Diagnosis not present

## 2016-04-25 DIAGNOSIS — R739 Hyperglycemia, unspecified: Secondary | ICD-10-CM

## 2016-04-25 DIAGNOSIS — M791 Myalgia: Secondary | ICD-10-CM

## 2016-04-25 MED ORDER — DULOXETINE HCL 20 MG PO CPEP
20.0000 mg | ORAL_CAPSULE | Freq: Every day | ORAL | Status: DC
Start: 2016-04-25 — End: 2016-05-21

## 2016-04-25 NOTE — Progress Notes (Signed)
Pre visit review using our clinic review tool, if applicable. No additional management support is needed unless otherwise documented below in the visit note. 

## 2016-04-25 NOTE — Progress Notes (Signed)
Subjective:    Patient ID: Keith Lawrence, male    DOB: 09/18/1996, 20 y.o.   MRN: [id number]  HPI  Pt in for follow up on occasional hx of mild high sugars,  mygalias, arhtralgias, stress, fatigue and low platelets. All of his labs were negative. Did a1-c, cbc, cmp, sed rate, ana and rf were all negative. Pt and relative thought maybe fibromyalgia. So I put him cymbalta and wanted him to follow up.  Pt thought cymbalta raised his blood sugar, blood pressure and pulse.  He states his blood sugar was 191. Though our a1-c was 5.0.  Pt had one am fasting level of 68. Other times 205 one day after eating.(pt thinks leptin levels may be off.)  Pt has subjective feels that his heart beat faster with cymbalta. He also felt like bp increased.   Pt has some positive + cytoplasmic flourescence comment on lab.  Pt relative is concerned that he may have wi-fi allergy      Review of Systems  Constitutional: Positive for fatigue. Negative for fever and chills.  Respiratory: Negative for cough, chest tightness, shortness of breath and wheezing.   Cardiovascular: Negative for chest pain and palpitations.  Gastrointestinal: Negative for abdominal pain.  Musculoskeletal: Positive for myalgias and arthralgias.  Skin: Negative for rash.  Hematological: Negative for adenopathy. Does not bruise/bleed easily.  Psychiatric/Behavioral: Negative for suicidal ideas, behavioral problems and confusion. The patient is not nervous/anxious.        Stress.      Past Medical History  Diagnosis Date  . Physical growth delay   . ADHD (attention deficit hyperactivity disorder)   . Goiter   . Hypothyroidism, acquired, autoimmune   . Thyroiditis, autoimmune   . Isosexual precocity   . Fatigue   . Iron excess   . SOB (shortness of breath) on exertion   . Nausea and vomiting   . Abdominal pain     all over pain     Social History   Social History  . Marital Status: Single    Spouse Name: N/A  .  Number of Children: N/A  . Years of Education: N/A   Occupational History  . Not on file.   Social History Main Topics  . Smoking status: Former Smoker -- 0.00 packs/day    Types: Cigarettes    Start date: 01/11/2012  . Smokeless tobacco: Never Used  . Alcohol Use: No  . Drug Use: Yes    Special: "Crack" cocaine, Other-see comments, Marijuana     Comment: Stopped 5 months ago  . Sexual Activity: Not on file   Other Topics Concern  . Not on file   Social History Narrative   Lives with foster parents. Mom still has guardianship.  [employer].     Past Surgical History  Procedure Laterality Date  . Chalazion excision    . Frenulectomy, lingual    . Tonsillectomy and adenoidectomy      Family History  Problem Relation Age of Onset  . Thyroid disease Maternal Grandmother   . Heart disease Maternal Grandmother   . Diabetes Neg Hx   . Thyroid disease Paternal Aunt   . Heart disease Paternal Grandfather     No Known Allergies  Current Outpatient Prescriptions on File Prior to Visit  Medication Sig Dispense Refill  . b complex vitamins tablet Take 1 tablet by mouth daily.    . DULoxetine (CYMBALTA) 30 MG capsule 1 tab po q day. 30 capsule  3  . fluticasone (FLONASE) 50 MCG/ACT nasal spray Place 2 sprays into both nostrils daily. 16 g 6  . HYDROcodone-acetaminophen (NORCO/VICODIN) 5-325 MG tablet Take 1 tablet by mouth every 4 (four) hours as needed for moderate pain.    . naproxen sodium (ANAPROX) 220 MG tablet Take 220 mg by mouth 2 (two) times daily as needed. Reported on 03/12/2016    . omeprazole (PRILOSEC) 20 MG capsule Take 1 capsule (20 mg total) by mouth daily. 30 capsule 3  . ondansetron (ZOFRAN) 4 MG tablet Take 1 tablet (4 mg total) by mouth every 6 (six) hours. 12 tablet 0  . SYNTHROID 25 MCG tablet Take 1 tablet (25 mcg total) by mouth daily before breakfast. 30 tablet 5   No current facility-administered medications on file prior to visit.    BP  110/70 mmHg  Pulse 78  Temp(Src) 98.1 F (36.7 C) (Oral)  Ht  (1.626 m)  Wt 142 lb (64.411 kg)  BMI 24.36 kg/m2  SpO2 98%       Objective:   Physical Exam  General Mental Status- Alert. General Appearance- Not in acute distress.   Skin General: Color- Normal Color. Moisture- Normal Moisture.  Neck Carotid Arteries- Normal color. Moisture- Normal Moisture. No carotid bruits. No JVD.  Chest and Lung Exam Auscultation: Breath Sounds:-Normal.  Cardiovascular Auscultation:Rythm- Regular. Murmurs & Other Heart Sounds:Auscultation of the heart reveals- No Murmurs.  Abdomen Inspection:-Inspeection Normal. Palpation/Percussion:Note:No mass. Palpation and Percussion of the abdomen reveal- Non Tender, Non Distended + BS, no rebound or guarding.   Neurologic Cranial Nerve exam:- CN III-XII intact(No nystagmus), symmetric smile. Strength:- 5/5 equal and symmetric strength both upper and lower extremities.      Assessment & Plan:  You report side effect of cymbalta but report that fibromyalgia pain is helped when on med. At your request I will wrote 20 mg since you feel that you had side effects.  Check bp, pulse and sugar every day as instructed. Will see if worse values on cymbalta.  Will refer to endocrine for your varying high and low levels despite good a1-c.  Will refer to rheumatologist for your joint and muscle pains.  Follow up in 1 months or as needed  Ronniesha Seibold, [name], [employer]

## 2016-04-25 NOTE — Patient Instructions (Addendum)
You report side effect of cymbalta but report that fibromyalgia pain is helped when on med. At your request I will wrote 20 mg since you feel that you had side effects.  Check bp, pulse and sugar every day as instructed. Will see if worse values on cymbalta.  Will refer to endocrine for your varying high and low levels despite good a1-c.  Will refer to rheumatologist for your joint and muscle pains.  Follow up in 1 months or as needed

## 2016-04-26 ENCOUNTER — Telehealth: Payer: Self-pay | Admitting: Physician Assistant

## 2016-04-26 NOTE — Telephone Encounter (Signed)
°  Relationship to patient: Self  Can be reached: [phone]  Pharmacy:  [id number] - [place],  - [address] [phone] ([phone] (Fax)        Reason for call: Request Rx for Accu Chek Glucose monitor and test strips be sent to pharmacy. States he checked with his insurance and they will cover the cost of ONLY this brand @ 100%

## 2016-04-26 NOTE — Telephone Encounter (Signed)
Patient informed that his blood glucose is at 86 and his A1C is at 5.0; we have no diagnosis to use for Glucose monitoring kit; therefore, we are not able to order through Insurance and he will need to wait until he sees Endo. Pt states that he seen Percell Miller 04/25/16 and he was told by Percell Miller that d/t his fluctuating and elevated CBGs that if he called his Insurance and found out which devcice they would cover that he would order it for him/SLS 07/21 Defer to [employer].

## 2016-04-26 NOTE — Telephone Encounter (Signed)
If you get name of devise they cover and write rx- associate with hyperglycemia. It may be covered. Don't associate with diabetes. But he does sugar leves sugar of up to 200 at times. If not covered then advise [facility]ion otc.

## 2016-04-29 NOTE — Telephone Encounter (Signed)
Spoke with pt and he states that he does not have the funds to get the test strips at this time. Pt was told by someone in our office we could give him a monitor with a few test strips to cover him until he can find the money to pay for the strips.   Pt also states that he has noticed some redness and swelling in his groin area but no discharge and he would like to get checked for STD's but the patient states he has not had an recent sexual partners. He states that he shaved the area and it did not help the swelling or redness. Pt states he will call back if his symptoms get better.

## 2016-04-29 NOTE — Telephone Encounter (Signed)
Left message for pt to call back. Pt will need to check with his insurance before we can write a Rx for a Glucose monitoring kit.

## 2016-04-29 NOTE — Telephone Encounter (Signed)
Pt called returning the call and would like nurse to return call at [phone]. Also pt stated has extra questions to ask. Please advise.

## 2016-05-07 ENCOUNTER — Encounter: Payer: Self-pay | Admitting: Physician Assistant

## 2016-05-07 ENCOUNTER — Ambulatory Visit (INDEPENDENT_AMBULATORY_CARE_PROVIDER_SITE_OTHER): Payer: 59 | Admitting: Physician Assistant

## 2016-05-07 VITALS — BP 116/72 | HR 88 | Temp 98.2°F | Resp 16 | Ht 64.0 in | Wt 147.2 lb

## 2016-05-07 DIAGNOSIS — Z113 Encounter for screening for infections with a predominantly sexual mode of transmission: Secondary | ICD-10-CM | POA: Diagnosis not present

## 2016-05-07 DIAGNOSIS — L089 Local infection of the skin and subcutaneous tissue, unspecified: Secondary | ICD-10-CM

## 2016-05-07 DIAGNOSIS — L729 Follicular cyst of the skin and subcutaneous tissue, unspecified: Secondary | ICD-10-CM | POA: Diagnosis not present

## 2016-05-07 DIAGNOSIS — R195 Other fecal abnormalities: Secondary | ICD-10-CM

## 2016-05-07 MED ORDER — CEPHALEXIN 500 MG PO CAPS: 500.0000 mg | ORAL_CAPSULE | Freq: Two times a day (BID) | ORAL | 0 refills | Status: DC

## 2016-05-07 NOTE — Patient Instructions (Signed)
Please go to the lab for blood work. I will call you with your results. Please take the antibiotic as directed for the infected cyst. Let me know if symptoms are not resolving.  Start a prebiotic/probiotic combination over-the-counter. Start a fiber supplement and increase hydration.  Try to limit gluten in the diet to see if this helps symptoms.  When you go to the lab, get the stool kit so we can retest stool for bacteria, parasites etc.

## 2016-05-07 NOTE — Progress Notes (Signed)
Patient presents to clinic today c/o knot of mid groin, above his penis x 2 weeks that has been intermittently red, swollen and tender. Denies drainage from site. Denies fever, chills, malaise or fatigue. Is not currently sexually active but has been previously with 1 male partner. Is requesting STD testing. Denies penile pain, testicular pain/swelling, penile lesion or discharge. Denies urinary urgency, frequency, hematuria.   Patient also still having mild bloating and loose stools. Endorses 2 loose stool per day. Denies blood in stool. Denies SOB, lightheadedness or dizziness. Chronic nausea has resolved. Has been assessed by GI previously with negative EGD and colonoscopy. Is staying hydrated.   Past Medical History:  Diagnosis Date  . Abdominal pain    all over pain  . ADHD (attention deficit hyperactivity disorder)   . Fatigue   . Goiter   . Hypothyroidism, acquired, autoimmune   . Iron excess   . Isosexual precocity   . Nausea and vomiting   . Physical growth delay   . SOB (shortness of breath) on exertion   . Thyroiditis, autoimmune     Current Outpatient Prescriptions on File Prior to Visit  Medication Sig Dispense Refill  . b complex vitamins tablet Take 1 tablet by mouth daily.    . DULoxetine (CYMBALTA) 20 MG capsule Take 1 capsule (20 mg total) by mouth daily. 30 capsule 0  . fluticasone (FLONASE) 50 MCG/ACT nasal spray Place 2 sprays into both nostrils daily. 16 g 6  . HYDROcodone-acetaminophen (NORCO/VICODIN) 5-325 MG tablet Take 1 tablet by mouth every 4 (four) hours as needed for moderate pain.    . naproxen sodium (ANAPROX) 220 MG tablet Take 220 mg by mouth 2 (two) times daily as needed. Reported on 03/12/2016    . omeprazole (PRILOSEC) 20 MG capsule Take 1 capsule (20 mg total) by mouth daily. 30 capsule 3  . SYNTHROID 25 MCG tablet Take 1 tablet (25 mcg total) by mouth daily before breakfast. 30 tablet 5   No current facility-administered medications on file  prior to visit.     No Known Allergies  Family History  Problem Relation Age of Onset  . Thyroid disease Maternal Grandmother   . Heart disease Maternal Grandmother   . Diabetes Neg Hx   . Thyroid disease Paternal Aunt   . Heart disease Paternal Grandfather     Social History   Social History  . Marital status: Single    Spouse name: N/A  . Number of children: N/A  . Years of education: N/A   Social History Main Topics  . Smoking status: Former Smoker    Packs/day: 0.00    Types: Cigarettes    Start date: 01/11/2012  . Smokeless tobacco: Never Used  . Alcohol use No  . Drug use:     Types: "Crack" cocaine, Other-see comments, Marijuana     Comment: Stopped 5 months ago  . Sexual activity: Not Asked   Other Topics Concern  . None   Social History Narrative   Lives with foster parents. Mom still has guardianship.  [employer].     Review of Systems - See HPI.  All other ROS are negative.  BP 116/72 (BP Location: Left Arm, Patient Position: Sitting, Cuff Size: Normal)   Pulse 88   Temp 98.2 F (36.8 C) (Oral)   Resp 16   Ht '5\' 4"'  (1.626 m)   Wt 147 lb 4 oz (66.8 kg)   SpO2 99%   BMI 25.28 kg/m  Physical Exam  Constitutional: He is oriented to person, place, and time and well-developed, well-nourished, and in no distress.  Eyes: Conjunctivae are normal.  Cardiovascular: Normal rate, regular rhythm, normal heart sounds and intact distal pulses.   Abdominal: Soft. Bowel sounds are normal. He exhibits no distension. There is no tenderness.  Genitourinary: Testes/scrotum normal and penis normal. No discharge found.  Neurological: He is alert and oriented to person, place, and time.  Skin: Skin is warm and dry. No rash noted.     Psychiatric: Affect normal.  Vitals reviewed.   Recent Results (from the past 2160 hour(s))  CBC w/Diff     Status: None   Collection Time: 02/16/16  8:33 AM  Result Value Ref Range   WBC 6.5 4.5 - 10.5 K/uL   RBC 5.04  4.22 - 5.81 Mil/uL   Hemoglobin 15.6 13.0 - 17.0 g/dL   HCT 45.1 39.0 - 52.0 %   MCV 89.4 78.0 - 100.0 fl   MCHC 34.7 30.0 - 36.0 g/dL   RDW 13.1 11.5 - 14.6 %   Platelets 235.0 150.0 - 400.0 K/uL   Neutrophils Relative % 60.5 43.0 - 77.0 %   Lymphocytes Relative 29.3 12.0 - 46.0 %   Monocytes Relative 8.6 3.0 - 12.0 %   Eosinophils Relative 1.2 0.0 - 5.0 %   Basophils Relative 0.4 0.0 - 3.0 %   Neutro Abs 3.9 1.4 - 7.7 K/uL   Lymphs Abs 1.9 0.7 - 4.0 K/uL   Monocytes Absolute 0.6 0.1 - 1.0 K/uL   Eosinophils Absolute 0.1 0.0 - 0.7 K/uL   Basophils Absolute 0.0 0.0 - 0.1 K/uL  HgB A1c     Status: None   Collection Time: 04/04/16  5:03 PM  Result Value Ref Range   Hgb A1c MFr Bld 5.0 4.6 - 6.5 %    Comment: Glycemic Control Guidelines for People with Diabetes:Non Diabetic:  <6%Goal of Therapy: <7%Additional Action Suggested:  >8%   CBC w/Diff     Status: None   Collection Time: 04/04/16  5:03 PM  Result Value Ref Range   WBC 4.8 4.5 - 10.5 K/uL   RBC 4.76 4.22 - 5.81 Mil/uL   Hemoglobin 14.7 13.0 - 17.0 g/dL   HCT 42.3 39.0 - 52.0 %   MCV 88.7 78.0 - 100.0 fl   MCHC 34.8 30.0 - 36.0 g/dL   RDW 13.1 11.5 - 14.6 %   Platelets 204.0 150.0 - 400.0 K/uL   Neutrophils Relative % 56.3 43.0 - 77.0 %   Lymphocytes Relative 32.7 12.0 - 46.0 %   Monocytes Relative 9.5 3.0 - 12.0 %   Eosinophils Relative 1.1 0.0 - 5.0 %   Basophils Relative 0.4 0.0 - 3.0 %   Neutro Abs 2.7 1.4 - 7.7 K/uL   Lymphs Abs 1.6 0.7 - 4.0 K/uL   Monocytes Absolute 0.5 0.1 - 1.0 K/uL   Eosinophils Absolute 0.1 0.0 - 0.7 K/uL   Basophils Absolute 0.0 0.0 - 0.1 K/uL  Comprehensive metabolic panel     Status: None   Collection Time: 04/04/16  5:03 PM  Result Value Ref Range   Sodium 141 135 - 145 mEq/L   Potassium 3.9 3.5 - 5.1 mEq/L   Chloride 104 96 - 112 mEq/L   CO2 30 19 - 32 mEq/L   Glucose, Bld 86 70 - 99 mg/dL   BUN 12 6 - 23 mg/dL   Creatinine, Ser 0.98 0.40 - 1.50 mg/dL   Total Bilirubin 0.4  0.2  - 1.2 mg/dL   Alkaline Phosphatase 44 39 - 117 U/L   AST 18 0 - 37 U/L   ALT 17 0 - 53 U/L   Total Protein 7.3 6.0 - 8.3 g/dL   Albumin 4.7 3.5 - 5.2 g/dL   Calcium 10.5 8.4 - 10.5 mg/dL   GFR 102.85 >60.00 mL/min  Sedimentation rate     Status: None   Collection Time: 04/04/16  5:03 PM  Result Value Ref Range   Sed Rate 2 0 - 15 mm/hr  ANA     Status: None   Collection Time: 04/04/16  5:03 PM  Result Value Ref Range   Anit Nuclear Antibody(ANA) NEG NEGATIVE    Comment: Although the specimen was negative for antinuclear antibodies (ANA), the presence of cytoplasmic fluorescence was noted on the HEp-2 slide. Other reactivities (e.g., anti-mitochondrial antibodies or anti-smooth muscle antibodies) may be responsible for this fluorescence.   Rheumatoid factor     Status: None   Collection Time: 04/04/16  5:03 PM  Result Value Ref Range   Rhuematoid fact SerPl-aCnc <10 <=14 IU/mL    Comment:                            Interpretive Table                     Low Positive: 15 - 41 IU/mL                     High Positive:  >= 42 IU/mL    In addition to the RF result, and clinical symptoms including joint  involvement, the 2010 ACR Classification Criteria for  scoring/diagnosing Rheumatoid Arthritis include the results of the  following tests:  CRP (56153), ESR (15010), and CCP (APCA) (79432).  www.rheumatology.org/practice/clinical/classification/ra/ra_2010.asp     Assessment/Plan: 1. Infected cyst of skin Mild infection. No palpable lymphadenopathy. Rx Keflex. Supportive measures reviewed. FU if not resolving with ABX. - cephALEXin (KEFLEX) 500 MG capsule; Take 1 capsule (500 mg total) by mouth 2 (two) times daily.  Dispense: 14 capsule; Refill: 0  2. Screen for STD (sexually transmitted disease) 1 prior partner. No concerns. Discussed cyst not related. Will check routine STI panel. - HIV antibody - RPR - HSV(herpes smplx)abs-1+2(IgG+IgM)-bld - GC/Chlamydia Probe Amp  3.  Loose stools Chronic. Negative colonoscopy, EGD and GI assessment. Discussed IBS. Will obtain stool panel. Will start prebiotic/probiotic combination and fiber supplement. Increase fluids. Discussed trial of gluten free diet. Patient will try.    Leeanne Rio, PA-C

## 2016-05-07 NOTE — Progress Notes (Signed)
Pre visit review using our clinic review tool, if applicable. No additional management support is needed unless otherwise documented below in the visit note/SLS  

## 2016-05-08 LAB — GC/CHLAMYDIA PROBE AMP
CT PROBE, AMP APTIMA: NOT DETECTED
GC Probe RNA: NOT DETECTED

## 2016-05-08 LAB — HIV ANTIBODY (ROUTINE TESTING W REFLEX): HIV: NONREACTIVE

## 2016-05-08 LAB — RPR

## 2016-05-09 LAB — HSV(HERPES SMPLX)ABS-I+II(IGG+IGM)-BLD
HERPES SIMPLEX VRS I-IGM AB (EIA): 0.38 {index}
HSV 1 Glycoprotein G Ab, IgG: <0.9 Index (ref <0.90)

## 2016-05-13 ENCOUNTER — Telehealth: Payer: Self-pay | Admitting: *Deleted

## 2016-05-13 NOTE — Telephone Encounter (Signed)
[facility] note received via fax  Call:   Date: 05/12/16 Time: 1334   Caller: Self Return number: [phone]  Nurse: Brantley Stageheryl Cowan, RN   Chief Complaint: Skin Lesion- Moles/Lumps/Growths  Reason for call: Caller states that he was seen in the office this past Tuesday for a lump in the groin area. States that he was dx with a cyst and started on ceftin. States that at the time he had some purple/red marks on his inner thighs that are long like broken blood vessels but forgot to show his PCP. The marks have gotten more since he was seen and is not sure what they are from. Pt has a long list of medical complaints of which he says he has addressed with his PCP. Denies any pain or symptoms from the spots and is having so S/S from the cyst.  Related visit to physician within the last 2 weeks: No  Guideline: Infection on Antibiotic Follow-up Call; Caller has NON-URGENT question and triager unable to answer question  Disposition: Call PCP within 24 Hours  **Discussed w/ PCP, MyChart message sent to pt**

## 2016-05-17 ENCOUNTER — Telehealth: Payer: Self-pay | Admitting: *Deleted

## 2016-05-17 ENCOUNTER — Encounter: Payer: Self-pay | Admitting: Physician Assistant

## 2016-05-17 ENCOUNTER — Ambulatory Visit (INDEPENDENT_AMBULATORY_CARE_PROVIDER_SITE_OTHER): Payer: 59 | Admitting: Physician Assistant

## 2016-05-17 VITALS — BP 114/63 | HR 70 | Temp 98.2°F | Resp 16 | Ht 64.0 in | Wt 145.2 lb

## 2016-05-17 DIAGNOSIS — L906 Striae atrophicae: Secondary | ICD-10-CM

## 2016-05-17 DIAGNOSIS — E039 Hypothyroidism, unspecified: Secondary | ICD-10-CM

## 2016-05-17 LAB — TSH: TSH: 3.3 u[IU]/mL (ref 0.35–5.50)

## 2016-05-17 NOTE — Progress Notes (Signed)
Patient presents to clinic today c/o violaceous marks of his inner thighs, first noted a few weeks prior. Denies pain or pruritus of lesions. Denies trauma or injury to the area. Denies similar areas noted elsewhere.  Past Medical History:  Diagnosis Date  . Abdominal pain    all over pain  . ADHD (attention deficit hyperactivity disorder)   . Fatigue   . Goiter   . Hypothyroidism, acquired, autoimmune   . Iron excess   . Isosexual precocity   . Nausea and vomiting   . Physical growth delay   . SOB (shortness of breath) on exertion   . Thyroiditis, autoimmune     Current Outpatient Prescriptions on File Prior to Visit  Medication Sig Dispense Refill  . b complex vitamins tablet Take 1 tablet by mouth daily.    . DULoxetine (CYMBALTA) 20 MG capsule Take 1 capsule (20 mg total) by mouth daily. 30 capsule 0  . fluticasone (FLONASE) 50 MCG/ACT nasal spray Place 2 sprays into both nostrils daily. 16 g 6  . HYDROcodone-acetaminophen (NORCO/VICODIN) 5-325 MG tablet Take 1 tablet by mouth every 4 (four) hours as needed for moderate pain.    . naproxen sodium (ANAPROX) 220 MG tablet Take 220 mg by mouth 2 (two) times daily as needed. Reported on 03/12/2016    . omeprazole (PRILOSEC) 20 MG capsule Take 1 capsule (20 mg total) by mouth daily. 30 capsule 3  . SYNTHROID 25 MCG tablet Take 1 tablet (25 mcg total) by mouth daily before breakfast. 30 tablet 5   No current facility-administered medications on file prior to visit.     No Known Allergies  Family History  Problem Relation Age of Onset  . Thyroid disease Maternal Grandmother   . Heart disease Maternal Grandmother   . Diabetes Neg Hx   . Thyroid disease Paternal Aunt   . Heart disease Paternal Grandfather     Social History   Social History  . Marital status: Single    Spouse name: N/A  . Number of children: N/A  . Years of education: N/A   Social History Main Topics  . Smoking status: Former Smoker    Packs/day: 0.00     Types: Cigarettes    Start date: 01/11/2012  . Smokeless tobacco: Never Used  . Alcohol use No  . Drug use:     Types: "Crack" cocaine, Other-see comments, Marijuana     Comment: Stopped 5 months ago  . Sexual activity: Not Asked   Other Topics Concern  . None   Social History Narrative   Lives with foster parents. Mom still has guardianship.  [employer].     Review of Systems - See HPI.  All other ROS are negative.  BP 114/63 (BP Location: Right Arm, Patient Position: Sitting, Cuff Size: Normal)   Pulse 70   Temp 98.2 F (36.8 C) (Oral)   Resp 16   Ht '5\' 4"'  (1.626 m)   Wt 145 lb 4 oz (65.9 kg)   SpO2 100%   BMI 24.93 kg/m   Physical Exam  Constitutional: He is oriented to person, place, and time and well-developed, well-nourished, and in no distress.  HENT:  Head: Normocephalic and atraumatic.  Eyes: Conjunctivae are normal.  Neck: Neck supple.  Cardiovascular: Normal rate, regular rhythm, normal heart sounds and intact distal pulses.   Pulmonary/Chest: Effort normal.  Neurological: He is alert and oriented to person, place, and time.  Skin:     Psychiatric: Affect  normal.  Vitals reviewed.   Recent Results (from the past 2160 hour(s))  HgB A1c     Status: None   Collection Time: 04/04/16  5:03 PM  Result Value Ref Range   Hgb A1c MFr Bld 5.0 4.6 - 6.5 %    Comment: Glycemic Control Guidelines for People with Diabetes:Non Diabetic:  <6%Goal of Therapy: <7%Additional Action Suggested:  >8%   CBC w/Diff     Status: None   Collection Time: 04/04/16  5:03 PM  Result Value Ref Range   WBC 4.8 4.5 - 10.5 K/uL   RBC 4.76 4.22 - 5.81 Mil/uL   Hemoglobin 14.7 13.0 - 17.0 g/dL   HCT 42.3 39.0 - 52.0 %   MCV 88.7 78.0 - 100.0 fl   MCHC 34.8 30.0 - 36.0 g/dL   RDW 13.1 11.5 - 14.6 %   Platelets 204.0 150.0 - 400.0 K/uL   Neutrophils Relative % 56.3 43.0 - 77.0 %   Lymphocytes Relative 32.7 12.0 - 46.0 %   Monocytes Relative 9.5 3.0 - 12.0 %    Eosinophils Relative 1.1 0.0 - 5.0 %   Basophils Relative 0.4 0.0 - 3.0 %   Neutro Abs 2.7 1.4 - 7.7 K/uL   Lymphs Abs 1.6 0.7 - 4.0 K/uL   Monocytes Absolute 0.5 0.1 - 1.0 K/uL   Eosinophils Absolute 0.1 0.0 - 0.7 K/uL   Basophils Absolute 0.0 0.0 - 0.1 K/uL  Comprehensive metabolic panel     Status: None   Collection Time: 04/04/16  5:03 PM  Result Value Ref Range   Sodium 141 135 - 145 mEq/L   Potassium 3.9 3.5 - 5.1 mEq/L   Chloride 104 96 - 112 mEq/L   CO2 30 19 - 32 mEq/L   Glucose, Bld 86 70 - 99 mg/dL   BUN 12 6 - 23 mg/dL   Creatinine, Ser 0.98 0.40 - 1.50 mg/dL   Total Bilirubin 0.4 0.2 - 1.2 mg/dL   Alkaline Phosphatase 44 39 - 117 U/L   AST 18 0 - 37 U/L   ALT 17 0 - 53 U/L   Total Protein 7.3 6.0 - 8.3 g/dL   Albumin 4.7 3.5 - 5.2 g/dL   Calcium 10.5 8.4 - 10.5 mg/dL   GFR 102.85 >60.00 mL/min  Sedimentation rate     Status: None   Collection Time: 04/04/16  5:03 PM  Result Value Ref Range   Sed Rate 2 0 - 15 mm/hr  ANA     Status: None   Collection Time: 04/04/16  5:03 PM  Result Value Ref Range   Anit Nuclear Antibody(ANA) NEG NEGATIVE    Comment: Although the specimen was negative for antinuclear antibodies (ANA), the presence of cytoplasmic fluorescence was noted on the HEp-2 slide. Other reactivities (e.g., anti-mitochondrial antibodies or anti-smooth muscle antibodies) may be responsible for this fluorescence.   Rheumatoid factor     Status: None   Collection Time: 04/04/16  5:03 PM  Result Value Ref Range   Rhuematoid fact SerPl-aCnc <10 <=14 IU/mL    Comment:                            Interpretive Table                     Low Positive: 15 - 41 IU/mL  High Positive:  >= 42 IU/mL    In addition to the RF result, and clinical symptoms including joint  involvement, the 2010 ACR Classification Criteria for  scoring/diagnosing Rheumatoid Arthritis include the results of the  following tests:  CRP (49826), ESR (15010), and CCP  (APCA) (41583).  www.rheumatology.org/practice/clinical/classification/ra/ra_2010.asp   HIV antibody     Status: None   Collection Time: 05/07/16  4:39 PM  Result Value Ref Range   HIV 1&2 Ab, 4th Generation NONREACTIVE NONREACTIVE    Comment:   HIV-1 antigen and HIV-1/HIV-2 antibodies were not detected.  There is no laboratory evidence of HIV infection.   HIV-1/2 Antibody Diff        Not indicated. HIV-1 RNA, Qual TMA          Not indicated.     PLEASE NOTE: This information has been disclosed to you from records whose confidentiality may be protected by state law. If your state requires such protection, then the state law prohibits you from making any further disclosure of the information without the specific written consent of the person to whom it pertains, or as otherwise permitted by law. A general authorization for the release of medical or other information is NOT sufficient for this purpose.   The performance of this assay has not been clinically validated in patients less than 62 years old.   For additional information please refer to http://education.[facility].com/faq/FAQ106.  (This link is being provided for informational/educational purposes only.)     RPR     Status: None   Collection Time: 05/07/16  4:39 PM  Result Value Ref Range   RPR Ser Ql NON REAC NON REAC  HSV(herpes smplx)abs-1+2(IgG+IgM)-bld     Status: None   Collection Time: 05/07/16  4:39 PM  Result Value Ref Range   HSV 1 Glycoprotein G Ab, IgG <0.90 <0.90 Index    Comment:                      Index             Interpretation                    =====             ==============                    < 0.90               NEGATIVE                    0.90 - 1.09          EQUIVOCAL                    > 1.09               POSITIVE    HSV 2 Glycoprotein G Ab, IgG <0.90 <0.90 Index    Comment:                      Index             Interpretation                    =====              ==============                    < 0.90  NEGATIVE                    0.90 - 1.09          EQUIVOCAL                    > 1.09               POSITIVE This assay utilizes recombinant type-specific antigens to differentiate HSV-1 from HSV-2 infections. A positive result cannot distinguish between recent and past infection. If recent HSV infection is suspected but the results are negative or equivocal, the assay should be repeated in 4-6 weeks. The performance characteristics of the assay have not been established for pediatric populations, immunocompromised patients, or neonatal screening.      Herpes Simplex Vrs I&II-IgM Ab (EIA) 0.38 INDEX    Comment:                 <=0.90 . . . . . . Marland Kitchen NEGATIVE               0.91-1.09. . . . . Marland Kitchen EQUIVOCAL               >=1.10 . . . . . . Marland Kitchen POSITIVE                                                                                                   The results obtained with the HSV 1 & 2 IgM test should serve only as an aid to diagnosis and should not be interpreted as diagnostic by themselves. Heterotypic IgM antibody responses may occur in patients with a history of infection with other Herpes viruses, including Epstein-Barr virus and Varicella zoster virus, and give false positive results in HSV 1 & 2 IgM tests.  This test does not distinguish between HSV 1 and HSV 2.  A positive HSV IgM may indicate primary infection, but IgM antibody can persist 12 or more months after primary infection.  For confirmation, if clinically indicated, positive IgM results could be followed with the test for HSV 1 & 2 IgG glycoprotein (test code [id number]) in 4-6 weeks.   GC/Chlamydia Probe Amp     Status: None   Collection Time: 05/07/16  4:56 PM  Result Value Ref Range   CT Probe RNA NOT DETECTED     Comment:                    **Normal Reference Range: NOT DETECTED**   This test was performed using the APTIMA COMBO2 Assay ([employer]   The  analytical performance characteristics of this assay, when used to test [facility] specimens have been determined by [facility]      GC Probe RNA NOT DETECTED     Comment:                    **Normal Reference Range: NOT DETECTED**   This test was performed using the APTIMA COMBO2 Assay ([employer]   The analytical performance characteristics of this assay, when used to test [facility]  specimens have been determined by [facility]       Assessment/Plan: No problem-specific Assessment & Plan notes found for this encounter.    Leeanne Rio, PA-C

## 2016-05-17 NOTE — Telephone Encounter (Signed)
[  05/17/2016 10:51 AM] Laural BenesJohnson, Shiquita:  Precious GildingHey, Edward doesnt feel comfortable. So ill explain to the pt that cody will see him after seeing other pt's

## 2016-05-17 NOTE — Patient Instructions (Signed)
Please go to the lab for blood work. I will call you with your results  Please keep your phone on so we can get you in with Endocrinology for further assessment.

## 2016-05-18 DIAGNOSIS — L906 Striae atrophicae: Secondary | ICD-10-CM | POA: Insufficient documentation

## 2016-05-18 NOTE — Assessment & Plan Note (Signed)
Will repeat thyroid function testing.

## 2016-05-18 NOTE — Assessment & Plan Note (Signed)
Recent onset without noted weight gain. Discussed marks themselves are benign. Giving patients history of growth disturbance, hypothyroidism and new onset stria, will refer to Endocrinology for further assessment to make sure they feel there is not another hormonal contribution to symptoms.

## 2016-05-21 ENCOUNTER — Other Ambulatory Visit: Payer: Self-pay | Admitting: Medical

## 2016-05-28 ENCOUNTER — Ambulatory Visit: Payer: 59 | Admitting: Endocrinology

## 2016-06-13 ENCOUNTER — Encounter: Payer: Self-pay | Admitting: Endocrinology

## 2016-06-13 ENCOUNTER — Ambulatory Visit (INDEPENDENT_AMBULATORY_CARE_PROVIDER_SITE_OTHER): Payer: 59 | Admitting: Endocrinology

## 2016-06-13 VITALS — BP 128/78 | HR 79 | Ht 64.0 in | Wt 148.0 lb

## 2016-06-13 DIAGNOSIS — R3589 Other polyuria: Secondary | ICD-10-CM

## 2016-06-13 DIAGNOSIS — R358 Other polyuria: Secondary | ICD-10-CM

## 2016-06-13 DIAGNOSIS — R739 Hyperglycemia, unspecified: Secondary | ICD-10-CM | POA: Diagnosis not present

## 2016-06-13 DIAGNOSIS — L906 Striae atrophicae: Secondary | ICD-10-CM | POA: Diagnosis not present

## 2016-06-13 LAB — POCT GLYCOSYLATED HEMOGLOBIN (HGB A1C): Hemoglobin A1C: 5

## 2016-06-13 MED ORDER — DEXAMETHASONE 1 MG PO TABS: ORAL_TABLET | ORAL | 0 refills | Status: DC

## 2016-06-13 NOTE — Progress Notes (Signed)
Subjective:    Patient ID: Keith Lawrence, male    DOB: [birth year], 20 y.o.   MRN: [id number]  HPI Pt reports few mos of slight weakness of the muscles throughout the day, and assoc excessive thirst.  He checks cbg's: they vary from 125-205.  It is much higher after eating.  He does not feel symptoms are related to meals or cbg's.   Past Medical History:  Diagnosis Date  . Abdominal pain    all over pain  . ADHD (attention deficit hyperactivity disorder)   . Fatigue   . Goiter   . Hypothyroidism, acquired, autoimmune   . Iron excess   . Isosexual precocity   . Nausea and vomiting   . Physical growth delay   . SOB (shortness of breath) on exertion   . Thyroiditis, autoimmune     Past Surgical History:  Procedure Laterality Date  . CHALAZION EXCISION    . FRENULECTOMY, LINGUAL    . TONSILLECTOMY AND ADENOIDECTOMY      Social History   Social History  . Marital status: Single    Spouse name: N/A  . Number of children: N/A  . Years of education: N/A   Occupational History  . Not on file.   Social History Main Topics  . Smoking status: Former Smoker    Packs/day: 0.00    Types: Cigarettes    Start date: 01/11/2012  . Smokeless tobacco: Never Used  . Alcohol use No  . Drug use:     Types: "Crack" cocaine, Other-see comments, Marijuana     Comment: Stopped 5 months ago  . Sexual activity: Not on file   Other Topics Concern  . Not on file   Social History Narrative   Lives with foster parents. Mom still has guardianship.  [employer]ished  [employer].     Current Outpatient Prescriptions on File Prior to Visit  Medication Sig Dispense Refill  . b complex vitamins tablet Take 1 tablet by mouth daily.    . DULoxetine (CYMBALTA) 20 MG capsule TAKE 1 CAPSULE (20 MG TOTAL) BY MOUTH DAILY. 30 capsule 0  . fluticasone (FLONASE) 50 MCG/ACT nasal spray Place 2 sprays into both nostrils daily. 16 g 6  . HYDROcodone-acetaminophen (NORCO/VICODIN) 5-325 MG tablet Take 1 tablet by  mouth every 4 (four) hours as needed for moderate pain.    . naproxen sodium (ANAPROX) 220 MG tablet Take 220 mg by mouth 2 (two) times daily as needed. Reported on 03/12/2016    . omeprazole (PRILOSEC) 20 MG capsule Take 1 capsule (20 mg total) by mouth daily. 30 capsule 3  . SYNTHROID 25 MCG tablet Take 1 tablet (25 mcg total) by mouth daily before breakfast. 30 tablet 5   No current facility-administered medications on file prior to visit.     No Known Allergies  Family History  Problem Relation Age of Onset  . Thyroid disease Maternal Grandmother   . Heart disease Maternal Grandmother   . Thyroid disease Paternal Aunt   . Heart disease Paternal Grandfather   . Diabetes Neg Hx     BP 128/78   Pulse 79   Ht 5\' 4"  (1.626 m)   Wt 148 lb (67.1 kg)   SpO2 98%   BMI 25.40 kg/m    Review of Systems He has excessive appetite. denies blurry vision, chest pain, sob, n/v, muscle cramps, excessive diaphoresis, depression, rhinorrhea, and easy bruising.  He has gained a few lbs.  He has intermittent headache, cold  intolerance, and excessive urination.     Objective:   Physical Exam VS: see vs page GEN: no distress HEAD: head: no deformity eyes: no periorbital swelling, no proptosis.  external nose and ears are normal mouth: no lesion seen NECK: supple, thyroid is not enlarged CHEST WALL: no deformity.  No buffalo hump. LUNGS: clear to auscultation CV: reg rate and rhythm, no murmur ABD: abdomen is soft, nontender.  no hepatosplenomegaly.  not distended.  no hernia MUSCULOSKELETAL: muscle bulk and strength are grossly normal.  no obvious joint swelling.  gait is normal and steady EXTEMITIES: no deformity.  no ulcer on the feet.  feet are of normal color and temp.  no edema PULSES: dorsalis pedis intact bilat.  no carotid bruit NEURO:  cn 2-12 grossly intact.   readily moves all 4's.  sensation is intact to touch on the feet SKIN:  Normal texture and temperature.  No rash or  suspicious lesion is visible.  Striae on the medial thighs.   NODES:  None palpable at the neck PSYCH: alert, well-oriented.  Does not appear anxious nor depressed.    A1c=5.0%  Lab Results  Component Value Date   TSH 3.30 05/17/2016   T4TOTAL 7.2 01/27/2016   CT: Pancreas: Normal appearance  I have reviewed outside records, and summarized: Pt was noted to have striae.  A question of relationship to his relatively short stature was raised.       Assessment & Plan:  Hyperglycemia: new: he is at high risk for developing DM.  Hypothyroidism: if he develops DM, it would likely be type 1.  Striae: new.  We'll test for Cushing's.  Relatively short stature: uncertain relationship to the above.

## 2016-06-13 NOTE — Patient Instructions (Addendum)
You should do a "dexamethasone suppression test."  For this, you would take dexamethasone 1 mg at 10 pm (I have sent a prescription to your pharmacy), then come in for a "cortisol" blood test the next morning before 9 am.  You do not need to be fasting for this test.  Please do this on a morning after you have been sleeping (rather than working).   Although your overall average blood sugar is good.  However, the fact that it is high sometimes, indicates that your pancreas is not working right.  Therefore, with time, you have a high chance of developing diabetes.  Diet and exercise can delay this.  With a milk container, please measure the amount of urine you make in a 24-HR period.  Then please call us, and tell us how much there is.   Please come back for a follow-up appointment in 3 months.  Please see Bonita QuinLinda the same day, to learn about insulin.

## 2016-06-14 LAB — URINALYSIS, ROUTINE W REFLEX MICROSCOPIC
BILIRUBIN URINE: NEGATIVE
Hgb urine dipstick: NEGATIVE
KETONES UR: NEGATIVE
Leukocytes, UA: NEGATIVE
Nitrite: NEGATIVE
Specific Gravity, Urine: 1.02 (ref 1.000–1.030)
TOTAL PROTEIN, URINE-UPE24: NEGATIVE
URINE GLUCOSE: NEGATIVE
UROBILINOGEN UA: 0.2 (ref 0.0–1.0)
pH: 6 (ref 5.0–8.0)

## 2016-06-14 LAB — CORTISOL: CORTISOL PLASMA: 6.4 ug/dL

## 2016-06-24 ENCOUNTER — Telehealth: Payer: Self-pay | Admitting: Endocrinology

## 2016-06-24 NOTE — Telephone Encounter (Signed)
I contacted the paitent and advised of message for MD and about the results from 06/21/2016 from the mychart result via voicemail. Requested a call back if the patient would like to discuss.

## 2016-06-24 NOTE — Telephone Encounter (Signed)
PT called and was asking about the urine sample that he did on Friday, he also called to say that over the 24 hour urine testing, he doesn't even have near half a jug.  He requests call back.

## 2016-06-24 NOTE — Telephone Encounter (Signed)
See message and please advise, Thanks!  

## 2016-06-24 NOTE — Telephone Encounter (Signed)
Don't worry about how full it was.  i'll let you know when we get the result

## 2016-07-02 ENCOUNTER — Telehealth: Payer: Self-pay | Admitting: Endocrinology

## 2016-07-02 ENCOUNTER — Other Ambulatory Visit: Payer: Self-pay | Admitting: Endocrinology

## 2016-07-02 ENCOUNTER — Other Ambulatory Visit (INDEPENDENT_AMBULATORY_CARE_PROVIDER_SITE_OTHER): Payer: 59

## 2016-07-02 DIAGNOSIS — L906 Striae atrophicae: Secondary | ICD-10-CM | POA: Diagnosis not present

## 2016-07-02 LAB — CORTISOL: Cortisol, Plasma: 0.2 ug/dL

## 2016-07-02 NOTE — Telephone Encounter (Signed)
See message and please advise, Thanks!  

## 2016-07-02 NOTE — Telephone Encounter (Signed)
We have the cortisol blood test--good.  We have the 24-HR urine for volume.  What is the state of the 24-HR urine for cortisol?  Did he return it after he collected it 1-2 weeks ago?

## 2016-07-02 NOTE — Telephone Encounter (Signed)
I contacted the patient and advised of note. Patient stated he did not bring the 24 hr urine test today because he forgot. Patient stated he would bring this sometime this week for us to run.

## 2016-07-02 NOTE — Telephone Encounter (Signed)
Pt called in inquiring about his lab results, requests call back.

## 2016-07-02 NOTE — Telephone Encounter (Signed)
Patient stated he really need to talk to you about his labs he said his cortisol was normal, he don't know what that means because he still feels like crap. Please advise.

## 2016-07-04 ENCOUNTER — Telehealth: Payer: Self-pay | Admitting: Endocrinology

## 2016-07-04 NOTE — Telephone Encounter (Signed)
Pt called in and said that he is highly concerned because he is shaking and his blood sugar is 143 and he said he needs to talk to someone ASAP.

## 2016-07-04 NOTE — Telephone Encounter (Signed)
I contacted the patient. Before lunch the patient became really shaky and checked his blood sugar and it was 143 and he decided to call EMS. Patient stated he is feeling really bad and believes he does have cushing disease. Patient stated he will let us know what happens after the EMS leaves, but wanted you to know he had called them. Patient stated he is going to bring the 24 hour specimen in as soon as possible.

## 2016-07-07 ENCOUNTER — Telehealth: Payer: Self-pay | Admitting: Endocrinology

## 2016-07-07 NOTE — Telephone Encounter (Signed)
Lab calls.  They say 24-HR urine volume is 1500 ML.

## 2016-07-08 DIAGNOSIS — L906 Striae atrophicae: Secondary | ICD-10-CM | POA: Diagnosis not present

## 2016-07-09 ENCOUNTER — Telehealth: Payer: Self-pay | Admitting: Endocrinology

## 2016-07-09 NOTE — Telephone Encounter (Signed)
I contacted the patient and left a voicemail advising we are currently waiting on the results to come back for the 24 hr urine test. Patient advised once we receive the results we will contact him on how to proceed.

## 2016-07-09 NOTE — Telephone Encounter (Signed)
Patient is calling for the result of his 24 hr urine

## 2016-07-10 ENCOUNTER — Other Ambulatory Visit: Payer: Self-pay

## 2016-07-13 LAB — CORTISOL, URINE, 24 HOUR
CORTISOL (UR), FREE: 19.8 ug/(24.h) (ref 4.0–50.0)
RESULTS RECEIVED: 1.65 g/(24.h) (ref 0.63–2.50)

## 2016-07-15 ENCOUNTER — Telehealth: Payer: Self-pay

## 2016-07-15 ENCOUNTER — Telehealth: Payer: Self-pay | Admitting: Endocrinology

## 2016-07-15 NOTE — Telephone Encounter (Signed)
Patient ask you to give him a call, he has questions about his 24 hour urine ask.please advise

## 2016-07-15 NOTE — Telephone Encounter (Signed)
Pt is asking for call back # [phone]

## 2016-07-15 NOTE — Telephone Encounter (Signed)
Returned phone call, no answer. Left voicemail and call back number to discuss patient questions on 24 hour urine.

## 2016-07-15 NOTE — Telephone Encounter (Signed)
Called patient to notify him of his 24 hour urine; Advised that no further testing was needed for now. Patient was upset because he still states that he feels terrible. He is so fatigued, and has no energy at all. States that he can not live like this. He is hungry all the time, and gaining a lot of weight. I advised patient I would let you know he is still not any better. Please advise. Thank you!

## 2016-07-15 NOTE — Telephone Encounter (Signed)
Pt called in, he was wondering if his test results for the 24 hour urine had came in, if so he would like a call back to discuss.

## 2016-07-16 ENCOUNTER — Telehealth: Payer: Self-pay | Admitting: Endocrinology

## 2016-07-16 NOTE — Telephone Encounter (Signed)
See messages and please advise, Thanks!  

## 2016-07-16 NOTE — Telephone Encounter (Signed)
Keith Hashimotoatricia, Please verify if this message is correct. I do not see where he has seen Dr. Everardo AllEllison? Thanks!

## 2016-07-16 NOTE — Telephone Encounter (Signed)
Aside from having your blood sugar checked 1-2 times per year, no further testing is needed.  We are happy to answer any question you have.

## 2016-07-16 NOTE — Telephone Encounter (Signed)
Hello Keith MilletMegan he is [name] patient, he has two charts  I chose the wrong one sorry.  :)  Patient stated purple stretch marks , muscle pain fatigue hungry all the time, he just recently had blood work and a 24 hr urine  test done he do not understand why his cortisol level was high in his b/w but not his urine. He had drank alcohol the night before his 24 hr  Urine test. And want to know if that's why. Please advise

## 2016-07-16 NOTE — Telephone Encounter (Signed)
I contacted the patient and advised of message via voicemail. Requested a call back if the patient would like to discuss.  

## 2016-07-16 NOTE — Telephone Encounter (Signed)
Pt is asking if the alcohol he drank before he did the 24 hour urine test could mess it up or if he needs to do another test

## 2016-07-16 NOTE — Telephone Encounter (Signed)
Patient stated purple stretch marks , muscle pain fatigue hungry all the time, he just recently had blood work and a 24 hr urine  test done he do not understand why his cortisol level was high in his b/w but not his urine. He had drank alcohol the night before his 24 hr  Urine test. And want to know if that's why. Please advise

## 2016-07-16 NOTE — Telephone Encounter (Signed)
No, doesn't affect it

## 2016-07-16 NOTE — Telephone Encounter (Signed)
See message and please advise, Thanks!  

## 2016-07-16 NOTE — Telephone Encounter (Signed)
I contacted the patient and advised of Md's response. He verbalized understanding and had no further questions at this time.

## 2016-07-24 MED FILL — SYNTHROID 25 MCG TABLET: 25 | 30 days supply | Qty: 30 | Fill #0

## 2016-08-13 ENCOUNTER — Encounter: Payer: 59 | Admitting: Nutrition

## 2016-08-13 ENCOUNTER — Ambulatory Visit: Payer: 59 | Admitting: Endocrinology

## 2016-08-18 NOTE — Progress Notes (Deleted)
   Subjective:    Patient ID: Keith BeckmannMatthew A Lawrence, male    DOB: [birth year], 20 y.o.   MRN: [id number]  HPI  Pt reports few mos of slight weakness of the muscles throughout the day, and assoc excessive thirst.  He checks cbg's: they vary from 125-205.  It is much higher after eating.  He does not feel symptoms are related to meals or cbg's.    Hyperglycemia: new: he is at high risk for developing DM.  Hypothyroidism: if he develops DM, it would likely be type 1.  Striae: new.  We'll test for Cushing's.  Relatively short stature: uncertain relationship to the above.    Review of Systems     Objective:   Physical Exam        Assessment & Plan:

## 2016-08-20 ENCOUNTER — Ambulatory Visit: Payer: 59 | Admitting: Endocrinology

## 2016-08-20 DIAGNOSIS — Z0289 Encounter for other administrative examinations: Secondary | ICD-10-CM

## 2016-09-02 ENCOUNTER — Telehealth: Payer: Self-pay | Admitting: Physician Assistant

## 2016-09-02 NOTE — Telephone Encounter (Signed)
Patient request to transfer care from [name] to Dr. Copland °

## 2016-09-02 NOTE — Telephone Encounter (Signed)
Ok with me 

## 2016-09-02 NOTE — Telephone Encounter (Signed)
ok 

## 2016-09-03 NOTE — Telephone Encounter (Signed)
Left message for patient to call and schedule appointment with Dr. Copland °

## 2016-09-06 MED FILL — SYNTHROID 25 MCG TABLET: 25 | 30 days supply | Qty: 30 | Fill #1

## 2016-09-12 ENCOUNTER — Ambulatory Visit: Payer: 59 | Admitting: Family Medicine

## 2016-09-16 ENCOUNTER — Ambulatory Visit: Payer: 59 | Admitting: Family Medicine

## 2016-09-16 DIAGNOSIS — Z0289 Encounter for other administrative examinations: Secondary | ICD-10-CM

## 2016-09-19 ENCOUNTER — Ambulatory Visit: Payer: 59 | Admitting: Family Medicine

## 2016-09-19 DIAGNOSIS — Z0289 Encounter for other administrative examinations: Secondary | ICD-10-CM

## 2016-09-20 ENCOUNTER — Encounter: Payer: Self-pay | Admitting: Physician Assistant

## 2016-10-21 ENCOUNTER — Telehealth: Payer: Self-pay | Admitting: Physician Assistant

## 2016-10-21 NOTE — Telephone Encounter (Signed)
Patient called stating that he thinks he may have a UTI but he is not if that is what it is or if it is an STD. Patient would like a call back regarding this issue. He will not give me a lot of information regarding what is going on with him but would like a call back. Patient states he has transferred to Dr. Patsy Lageropland but has not actually been seen yet. Please advise  Phone: [phone]

## 2016-10-22 NOTE — Telephone Encounter (Signed)
Pt has been scheduled w/ Dr. Patsy Lageropland on Thursday at 4

## 2016-10-22 NOTE — Telephone Encounter (Signed)
LVM to have patient call to schedule an appt

## 2016-10-22 NOTE — Telephone Encounter (Signed)
Please inform pt that Dr. Patsy Lageropland is not in the today. He can schedule to see another provider today as an acute visit if he likes. If not we can put him on the schedule to see Dr. Patsy Lageropland tomorrow.

## 2016-10-24 ENCOUNTER — Ambulatory Visit: Payer: 59 | Admitting: Family Medicine

## 2016-10-25 MED FILL — SYNTHROID 25 MCG TABLET: 25 | 30 days supply | Qty: 30 | Fill #2

## 2016-10-25 NOTE — Telephone Encounter (Signed)
Follow up call made to patient. Appointment scheduled with E. Saguier for 10/28/16. Office was closed due to weather on this past Thursday.

## 2016-10-28 ENCOUNTER — Other Ambulatory Visit (HOSPITAL_COMMUNITY)
Admission: RE | Admit: 2016-10-28 | Discharge: 2016-10-28 | Disposition: A | Discharge status: Home / Self Care | Payer: 59 | Source: Ambulatory Visit | Attending: Medical | Admitting: Medical

## 2016-10-28 ENCOUNTER — Ambulatory Visit (INDEPENDENT_AMBULATORY_CARE_PROVIDER_SITE_OTHER): Payer: 59 | Admitting: Medical

## 2016-10-28 ENCOUNTER — Encounter: Payer: Self-pay | Admitting: Medical

## 2016-10-28 VITALS — BP 117/67 | HR 84 | Temp 98.0°F | Resp 16 | Ht 64.0 in | Wt 143.2 lb

## 2016-10-28 DIAGNOSIS — Z113 Encounter for screening for infections with a predominantly sexual mode of transmission: Secondary | ICD-10-CM | POA: Diagnosis not present

## 2016-10-28 DIAGNOSIS — J029 Acute pharyngitis, unspecified: Secondary | ICD-10-CM | POA: Diagnosis not present

## 2016-10-28 DIAGNOSIS — Z202 Contact with and (suspected) exposure to infections with a predominantly sexual mode of transmission: Secondary | ICD-10-CM | POA: Diagnosis not present

## 2016-10-28 DIAGNOSIS — Z7251 High risk heterosexual behavior: Secondary | ICD-10-CM

## 2016-10-28 DIAGNOSIS — R51 Headache: Secondary | ICD-10-CM

## 2016-10-28 DIAGNOSIS — M542 Cervicalgia: Secondary | ICD-10-CM

## 2016-10-28 DIAGNOSIS — R5383 Other fatigue: Secondary | ICD-10-CM | POA: Diagnosis not present

## 2016-10-28 DIAGNOSIS — R519 Headache, unspecified: Secondary | ICD-10-CM

## 2016-10-28 LAB — POC URINALSYSI DIPSTICK (AUTOMATED)
Blood, UA: NEGATIVE
Glucose, UA: NEGATIVE
KETONES UA: NEGATIVE
Leukocytes, UA: NEGATIVE
Nitrite, UA: NEGATIVE
PH UA: 7.5
PROTEIN UA: POSITIVE
SPEC GRAV UA: 1.025
Urobilinogen, UA: 4

## 2016-10-28 LAB — POCT RAPID STREP A (OFFICE): Rapid Strep A Screen: NEGATIVE

## 2016-10-28 MED ORDER — DOXYCYCLINE HYCLATE 100 MG PO TABS: 100.0000 mg | ORAL_TABLET | Freq: Two times a day (BID) | ORAL | 0 refills | Status: DC

## 2016-10-28 MED ORDER — NYSTATIN 100000 UNIT/GM EX CREA: 1.0000 "application " | TOPICAL_CREAM | Freq: Two times a day (BID) | CUTANEOUS | 1 refills | Status: DC

## 2016-10-28 NOTE — Patient Instructions (Addendum)
For your fatigue and history of low thyroid will get thyroid panel.  For std exposure we have test pending. Will rx doxycycline to treat nonspecific urethritis vs chlamydia pending test results.  Your rapid strep test was negative but will follow send out culture results as well.  For mild ha use ibuprofen otc.   For rash on groin rx nystatin.  Follow up10-14 days with Dr. Patsy Lageropland or as needed

## 2016-10-28 NOTE — Progress Notes (Signed)
Subjective:    Patient ID: Keith Lawrence, male    DOB: 01-Apr-1996, 21 y.o.   MRN: [id number]  HPI  Pt in with rash on his groin area. On and off rash for about a week. Pt put otc medication with aloe and did not help. The groin area does itch.  Some painful urination when he urinates. Symptoms after having sex with x-girlfriend about one month ago. No dc from penis. No rash on penis itself. No vesicles reported.  Pt also states he is fatigued.  Pt states Dr. Delphina Lawrence has told him in the past he has low thyroid. He states years ago that Dr. Delphina Lawrence gave him 25 mcg in the past. Keith Lawrence had been refilling the thyroid at low dose. He wants thyroid test done.( He was sent over to lab today even before I saw him since lab was closing.)  Pt also mentions that st for about one week. Also some mild ha. No diffuse myalgia.   Review of Systems  Constitutional: Positive for fatigue.  HENT: Positive for sore throat. Negative for congestion and drooling.   Cardiovascular: Negative for chest pain and palpitations.  Gastrointestinal: Negative for abdominal pain.  Musculoskeletal: Negative for back pain.  Skin: Positive for rash.  Neurological: Positive for headaches. Negative for dizziness, seizures, syncope, weakness and numbness.       Mild ha onset with st.   Past Medical History:  Diagnosis Date  . Abdominal pain    all over pain  . ADHD (attention deficit hyperactivity disorder)   . Fatigue   . Goiter   . Hypothyroidism, acquired, autoimmune   . Iron excess   . Isosexual precocity   . Nausea and vomiting   . Physical growth delay   . SOB (shortness of breath) on exertion   . Thyroiditis, autoimmune      Social History   Social History  . Marital status: Single    Spouse name: N/A  . Number of children: N/A  . Years of education: N/A   Occupational History  . Not on file.   Social History Main Topics  . Smoking status: Former Smoker    Packs/day: 0.00    Types: Cigarettes   Start date: 01/11/2012  . Smokeless tobacco: Never Used  . Alcohol use No  . Drug use: Yes    Types: "Crack" cocaine, Other-see comments, Marijuana     Comment: Stopped 5 months ago  . Sexual activity: Not on file   Other Topics Concern  . Not on file   Social History Narrative   Lives with foster parents. Mom still has guardianship.  [employer]ished  [employer].     Past Surgical History:  Procedure Laterality Date  . CHALAZION EXCISION    . FRENULECTOMY, LINGUAL    . TONSILLECTOMY AND ADENOIDECTOMY      Family History  Problem Relation Age of Onset  . Thyroid disease Maternal Grandmother   . Heart disease Maternal Grandmother   . Thyroid disease Paternal Aunt   . Heart disease Paternal Grandfather   . Diabetes Neg Hx     No Known Allergies  Current Outpatient Prescriptions on File Prior to Visit  Medication Sig Dispense Refill  . b complex vitamins tablet Take 1 tablet by mouth daily.    . DULoxetine (CYMBALTA) 20 MG capsule TAKE 1 CAPSULE (20 MG TOTAL) BY MOUTH DAILY. 30 capsule 0  . fluticasone (FLONASE) 50 MCG/ACT nasal spray Place 2 sprays into both nostrils daily. 16 g  6  . HYDROcodone-acetaminophen (NORCO/VICODIN) 5-325 MG tablet Take 1 tablet by mouth every 4 (four) hours as needed for moderate pain.    . naproxen sodium (ANAPROX) 220 MG tablet Take 220 mg by mouth 2 (two) times daily as needed. Reported on 03/12/2016    . omeprazole (PRILOSEC) 20 MG capsule Take 1 capsule (20 mg total) by mouth daily. 30 capsule 3  . SYNTHROID 25 MCG tablet Take 1 tablet (25 mcg total) by mouth daily before breakfast. 30 tablet 5   No current facility-administered medications on file prior to visit.     BP 117/67 (BP Location: Right Arm, Patient Position: Sitting, Cuff Size: Normal)   Pulse 84   Temp 98 F (36.7 C) (Oral)   Resp 16   Ht 5\' 4"  (1.626 m)   Wt 143 lb 4 oz (65 kg)   SpO2 100%   BMI 24.59 kg/m       Objective:   Physical Exam  General  Mental Status - Alert.  General Appearance - Well groomed. Not in acute distress.  Skin Rashes- No Rashes.  HEENT Head- Normal. Ear Auditory Canal - Left- Normal. Right - Normal.Tympanic Membrane- Left- Normal. Right- Normal. Eye Sclera/Conjunctiva- Left- Normal. Right- Normal. Nose & Sinuses Nasal Mucosa- Left-  Boggy and Congested. Right-  Boggy and  Congested.Bilateral no maxillary and no frontal sinus pressure. Mouth & Throat Lips: Upper Lip- Normal: no dryness, cracking, pallor, cyanosis, or vesicular eruption. Lower Lip-Normal: no dryness, cracking, pallor, cyanosis or vesicular eruption. Buccal Mucosa- Bilateral- No Aphthous ulcers. Oropharynx- No Discharge or Erythema. Tonsils: Characteristics- Bilateral- Mild  Erythema but no Congestion. Size/Enlargement- Bilateral- No enlargement. Discharge- bilateral-None.  Neck Neck- Supple. No Masses.   Chest and Lung Exam Auscultation: Breath Sounds:-Clear even and unlabored.  Cardiovascular Auscultation:Rythm- Regular, rate and rhythm. Murmurs & Other Heart Sounds:Ausculatation of the heart reveal- No Murmurs.  Lymphatic Head & Neck General Head & Neck Lymphatics: Bilateral: Description- No Localized lymphadenopathy.  Genital- no dc from penis. No testicle tenderness.   Skin- mild rash in groin area. Slight hyperpigmented.       Assessment & Plan:  For your fatigue and history of low thyroid will get thyroid panel.  For std exposure we have test pending. Will rx doxycycline to treat nonspecific urethritis vs chlamydia pending test results.  Your rapid strep test was negative but will follow send out culture results as well.  For mild ha use ibuprofen otc.   For rash on groin rx nystatin.  Follow up10-14 days with Dr. Patsy Lawrence or as needed  Keith Lawrence, Keith Lawrence, [place]

## 2016-10-28 NOTE — Progress Notes (Signed)
Pre visit review using our clinic review tool, if applicable. No additional management support is needed unless otherwise documented below in the visit note/SLS  

## 2016-10-29 ENCOUNTER — Other Ambulatory Visit: Payer: Self-pay | Admitting: Medical

## 2016-10-29 DIAGNOSIS — Z7251 High risk heterosexual behavior: Secondary | ICD-10-CM | POA: Diagnosis not present

## 2016-10-29 DIAGNOSIS — M542 Cervicalgia: Secondary | ICD-10-CM | POA: Diagnosis not present

## 2016-10-29 DIAGNOSIS — R5383 Other fatigue: Secondary | ICD-10-CM | POA: Diagnosis not present

## 2016-10-29 DIAGNOSIS — Z202 Contact with and (suspected) exposure to infections with a predominantly sexual mode of transmission: Secondary | ICD-10-CM | POA: Diagnosis not present

## 2016-10-29 LAB — COMPREHENSIVE METABOLIC PANEL
ALBUMIN: 4.7 g/dL (ref 3.5–5.2)
ALK PHOS: 47 U/L (ref 39–117)
ALT: 15 U/L (ref 0–53)
AST: 17 U/L (ref 0–37)
BILIRUBIN TOTAL: 0.5 mg/dL (ref 0.2–1.2)
BUN: 7 mg/dL (ref 6–23)
CO2: 29 mEq/L (ref 19–32)
CREATININE: 1.02 mg/dL (ref 0.40–1.50)
Calcium: 10.1 mg/dL (ref 8.4–10.5)
Chloride: 104 mEq/L (ref 96–112)
GFR: 97.67 mL/min (ref >60.00)
Glucose, Bld: 100 mg/dL — ABNORMAL HIGH (ref 70–99)
Potassium: 4.1 mEq/L (ref 3.5–5.1)
Sodium: 140 mEq/L (ref 135–145)
TOTAL PROTEIN: 7.3 g/dL (ref 6.0–8.3)

## 2016-10-29 LAB — T4, FREE: Free T4: 0.78 ng/dL (ref 0.60–1.60)

## 2016-10-29 LAB — T3, FREE: T3 FREE: 3.5 pg/mL (ref 2.3–4.2)

## 2016-10-29 LAB — TSH: TSH: 1.36 u[IU]/mL (ref 0.35–4.50)

## 2016-10-29 NOTE — Addendum Note (Signed)
Addended by: Harley AltoPRICE, KRISTY M on: 10/29/2016 11:30 AM   Modules accepted: Orders

## 2016-10-29 NOTE — Addendum Note (Signed)
Addended by: Harley AltoPRICE, KRISTY M on: 10/29/2016 08:08 AM   Modules accepted: Orders

## 2016-10-29 NOTE — Addendum Note (Signed)
Addended by: Harley AltoPRICE, Daisie Haft M on: 10/29/2016 09:01 AM   Modules accepted: Orders

## 2016-10-30 ENCOUNTER — Telehealth: Payer: Self-pay | Admitting: Medical

## 2016-10-30 DIAGNOSIS — A530 Latent syphilis, unspecified as early or late: Secondary | ICD-10-CM

## 2016-10-30 LAB — URINE CYTOLOGY ANCILLARY ONLY: Trichomonas: NEGATIVE

## 2016-10-30 LAB — RPR TITER

## 2016-10-30 LAB — NEISSERIA GONORRHOEAE, PROBE AMP: GC Probe RNA: NOT DETECTED

## 2016-10-30 LAB — STD PANEL
HEP B S AG: NEGATIVE
HIV 1&2 Ab, 4th Generation: NONREACTIVE
RPR: REACTIVE — AB

## 2016-10-30 LAB — FLUORESCENT TREPONEMAL AB(FTA)-IGG-BLD: Fluorescent Treponemal ABS: NONREACTIVE

## 2016-10-30 MED ORDER — DOXYCYCLINE HYCLATE 100 MG PO TABS: 100.0000 mg | ORAL_TABLET | Freq: Two times a day (BID) | ORAL | 0 refills | Status: DC

## 2016-10-30 NOTE — Telephone Encounter (Signed)
Copy & Paste into Result note. 

## 2016-10-30 NOTE — Telephone Encounter (Signed)
referal to Infectoius disease made.

## 2016-10-30 NOTE — Telephone Encounter (Signed)
Pt rpr came back reactive. This is screening test for syphilis but their can be false positives. He is already on doxycycline for 10 days. Will send in another 4 days or antibiotic.   He is on doxy for urethritis and that has coverage for syphilis.   Will go ahead and refer to infectious disease to get there opinion if accurate result or false positive.  Note I sent message yesterday that stated rpr was negative but must have looked at reference range result.  Also looks like treponomal antibody test pending.

## 2016-10-30 NOTE — Telephone Encounter (Signed)
Note Copy & Pasted in to Results note.

## 2016-10-30 NOTE — Telephone Encounter (Signed)
Pt called in to request his lab results.   CB: [phone]

## 2016-10-31 ENCOUNTER — Telehealth: Payer: Self-pay | Admitting: Medical

## 2016-10-31 LAB — URINE CYTOLOGY ANCILLARY ONLY
Bacterial vaginitis: NEGATIVE
CANDIDA VAGINITIS: NEGATIVE

## 2016-10-31 LAB — CULTURE, URINE COMPREHENSIVE

## 2016-10-31 LAB — CULTURE, GROUP A STREP

## 2016-10-31 MED ORDER — AMOXICILLIN 500 MG PO CAPS: 500.0000 mg | ORAL_CAPSULE | Freq: Two times a day (BID) | ORAL | 0 refills | Status: DC

## 2016-10-31 MED FILL — NYSTATIN 100,000 UNIT/GM CR: 100000 | 10 days supply | Qty: 30 | Fill #0

## 2016-10-31 MED FILL — DOXYCYCLINE HYCLATE 100 MG: 100 | 4 days supply | Qty: 8 | Fill #0

## 2016-10-31 NOTE — Telephone Encounter (Signed)
rx amoxicillin sent

## 2016-11-01 ENCOUNTER — Telehealth: Payer: Self-pay

## 2016-11-01 MED FILL — AMOXICILLIN 500 MG CAPSULE: 500 | 7 days supply | Qty: 14 | Fill #0

## 2016-11-01 NOTE — Telephone Encounter (Signed)
Patient called requesting an appointment for tomorrow to be seen for everything that is going on with him (reactive rpr). I reminded the patient that we are not open on Saturdays. He stated that he forgot it was Friday and thought that maybe he could come in today then for the shot that was suggested to him. Patient would like to know what his next steps should be regarding his new medical issues.  Please advise  Phone: [phone]

## 2016-11-01 NOTE — Telephone Encounter (Signed)
He can schedule an appointment for Monday/SLS

## 2016-11-01 NOTE — Telephone Encounter (Signed)
Patient states doxycycline (VIBRA-TABS) 100 MG tablet is making him feel nausea due to the the symptoms mentioned below, please advise best # [phone]  Patient scheduled for follow up for 11/04/16 at 1:15pm with Ramon DredgeEdward  I

## 2016-11-01 NOTE — Telephone Encounter (Signed)
Patient called back stating that he would like for 2nd ABO to be called in.

## 2016-11-01 NOTE — Telephone Encounter (Signed)
Called patient regarding note written by E Saguier,PS-C. Requested patient call back to inform us if he would like for 2nd ABO to be called in for throat. Reviewed message from [employer].

## 2016-11-03 ENCOUNTER — Telehealth: Payer: Self-pay | Admitting: Medical

## 2016-11-03 NOTE — Telephone Encounter (Signed)
Reviewed gc test negative. Discuss on day of visit.

## 2016-11-04 ENCOUNTER — Ambulatory Visit (INDEPENDENT_AMBULATORY_CARE_PROVIDER_SITE_OTHER): Payer: 59 | Admitting: Medical

## 2016-11-04 ENCOUNTER — Encounter: Payer: Self-pay | Admitting: Medical

## 2016-11-04 VITALS — BP 113/74 | HR 86 | Temp 98.3°F | Resp 16 | Ht 64.0 in | Wt 139.5 lb

## 2016-11-04 DIAGNOSIS — N342 Other urethritis: Secondary | ICD-10-CM | POA: Diagnosis not present

## 2016-11-04 DIAGNOSIS — R21 Rash and other nonspecific skin eruption: Secondary | ICD-10-CM

## 2016-11-04 DIAGNOSIS — Z113 Encounter for screening for infections with a predominantly sexual mode of transmission: Secondary | ICD-10-CM | POA: Diagnosis not present

## 2016-11-04 DIAGNOSIS — Z9289 Personal history of other medical treatment: Secondary | ICD-10-CM

## 2016-11-04 NOTE — Progress Notes (Signed)
Subjective:    Patient ID: Keith Lawrence, male    DOB: [birth year], 21 y.o.   MRN: [id number]  HPI Pt in for follow up on his +rpr(his treponomal antibody test was negative). I had given him doxycycline for his possible non specific urethritis pending study results. G and C test was negative(trich, bv and candida negative). Pt does have infectious disease appointment pending(referal made). Pt states e will attend that appointment.  Pt throat does feel better.  Rare strep seen on culture. He had some rare b strep on his throat culture. He will take amoxicllin after he takes doxycycline.   Pt still has some frequent urination.         Review of Systems  Constitutional: Negative for chills, diaphoresis, fatigue and fever.  HENT: Negative for congestion, ear discharge, ear pain, hearing loss, postnasal drip, rhinorrhea, sinus pain, sinus pressure, sore throat and trouble swallowing.        St better.  Respiratory: Negative for cough, chest tightness, shortness of breath and wheezing.   Cardiovascular: Negative for chest pain and palpitations.  Gastrointestinal: Negative for abdominal distention, abdominal pain, blood in stool and constipation.  Genitourinary: Positive for dysuria. Negative for decreased urine volume, difficulty urinating, discharge, flank pain, frequency, penile pain, scrotal swelling and testicular pain.  Musculoskeletal: Negative for back pain and joint swelling.  Hematological: Negative for adenopathy. Does not bruise/bleed easily.  Psychiatric/Behavioral: Negative for behavioral problems, confusion and self-injury. The patient is not nervous/anxious.     Past Medical History:  Diagnosis Date  . Abdominal pain    all over pain  . ADHD (attention deficit hyperactivity disorder)   . Fatigue   . Goiter   . Hypothyroidism, acquired, autoimmune   . Iron excess   . Isosexual precocity   . Nausea and vomiting   . Physical growth delay   . SOB (shortness of breath)  on exertion   . Thyroiditis, autoimmune      Social History   Social History  . Marital status: Single    Spouse name: N/A  . Number of children: N/A  . Years of education: N/A   Occupational History  . Not on file.   Social History Main Topics  . Smoking status: Former Smoker    Packs/day: 0.00    Types: Cigarettes    Start date: 01/11/2012  . Smokeless tobacco: Never Used  . Alcohol use No  . Drug use: Yes    Types: "Crack" cocaine, Other-see comments, Marijuana     Comment: Stopped 5 months ago  . Sexual activity: Not on file   Other Topics Concern  . Not on file   Social History Narrative   Lives with foster parents. Mom still has guardianship.  [employer].     Past Surgical History:  Procedure Laterality Date  . CHALAZION EXCISION    . FRENULECTOMY, LINGUAL    . TONSILLECTOMY AND ADENOIDECTOMY      Family History  Problem Relation Age of Onset  . Thyroid disease Maternal Grandmother   . Heart disease Maternal Grandmother   . Thyroid disease Paternal Aunt   . Heart disease Paternal Grandfather   . Diabetes Neg Hx     No Known Allergies  Current Outpatient Prescriptions on File Prior to Visit  Medication Sig Dispense Refill  . b complex vitamins tablet Take 1 tablet by mouth daily.    Marland Kitchen doxycycline (VIBRA-TABS) 100 MG tablet Take 1 tablet (100 mg total) by mouth 2 (  two) times daily. Can give generic or caps 20 tablet 0  . DULoxetine (CYMBALTA) 20 MG capsule TAKE 1 CAPSULE (20 MG TOTAL) BY MOUTH DAILY. 30 capsule 0  . fluticasone (FLONASE) 50 MCG/ACT nasal spray Place 2 sprays into both nostrils daily. 16 g 6  . HYDROcodone-acetaminophen (NORCO/VICODIN) 5-325 MG tablet Take 1 tablet by mouth every 4 (four) hours as needed for moderate pain.    . naproxen sodium (ANAPROX) 220 MG tablet Take 220 mg by mouth 2 (two) times daily as needed. Reported on 03/12/2016    . nystatin cream (MYCOSTATIN) Apply 1 application topically 2 (two) times daily. 30 g 1    . omeprazole (PRILOSEC) 20 MG capsule Take 1 capsule (20 mg total) by mouth daily. 30 capsule 3  . SYNTHROID 25 MCG tablet Take 1 tablet (25 mcg total) by mouth daily before breakfast. 30 tablet 5  . amoxicillin (AMOXIL) 500 MG capsule Take 1 capsule (500 mg total) by mouth 2 (two) times daily. (Patient not taking: Reported on 11/04/2016) 14 capsule 0  . doxycycline (VIBRA-TABS) 100 MG tablet Take 1 tablet (100 mg total) by mouth 2 (two) times daily. Can give caps or generic (Patient not taking: Reported on 11/04/2016) 8 tablet 0   No current facility-administered medications on file prior to visit.     BP 113/74 (BP Location: Left Arm, Patient Position: Sitting, Cuff Size: Large)   Pulse 86   Temp 98.3 F (36.8 C) (Oral)   Resp 16   Ht 5\' 4"  (1.626 m)   Wt 139 lb 8 oz (63.3 kg)   SpO2 100%   BMI 23.95 kg/m       Objective:   Physical Exam  General  Mental Status - Alert. General Appearance - Well groomed. Not in acute distress.  Skin Rashes- No Rashes.  HEENT Head- Normal. Ear Auditory Canal - Left- Normal. Right - Normal.Tympanic Membrane- Left- Normal. Right- Normal. Eye Sclera/Conjunctiva- Left- Normal. Right- Normal. Nose & Sinuses Nasal Mucosa- Left-  Boggy and Congested. Right- no  Boggy and  no Congested.Bilateral  No maxillary and no  frontal sinus pressure. Mouth & Throat Lips: Upper Lip- Normal: no dryness, cracking, pallor, cyanosis, or vesicular eruption. Lower Lip-Normal: no dryness, cracking, pallor, cyanosis or vesicular eruption. Buccal Mucosa- Bilateral- No Aphthous ulcers. Oropharynx- No Discharge or Erythema. Tonsils: Characteristics- Bilateral- No Erythema or Congestion. Size/Enlargement- Bilateral- No enlargement. Discharge- bilateral-None.  Neck Neck- Supple. No Masses.   Chest and Lung Exam Auscultation: Breath Sounds:-Clear even and unlabored.  Cardiovascular Auscultation:Rythm- Regular, rate and rhythm. Murmurs & Other Heart  Sounds:Ausculatation of the heart reveal- No Murmurs.  Lymphatic Head & Neck General Head & Neck Lymphatics: Bilateral: Description- No Localized lymphadenopathy.  Genital- exam- normal. But he has some persisting hyperpigmentation in groin areas.      Assessment & Plan:  For your + rpr continue the doxycycline.   Infectious disease referral is pending.   In 2 weeks repeat treponomal flourescent antibody test.  If urethritis persists refer to uroogist.  Continue nystatin ror rash.  In two weeks will get hsv tests with above labs.  For your strep start amoxicillin but after doxy. Make sure restart probiotics.  Follow up labs in 2 weeks or as needed  Lovada Barwick, Ramon Dredgedward, [place]

## 2016-11-04 NOTE — Progress Notes (Signed)
Pre visit review using our clinic review tool, if applicable. No additional management support is needed unless otherwise documented below in the visit note/SLS  

## 2016-11-04 NOTE — Patient Instructions (Addendum)
For your + rpr continue the doxycycline.   Infectious disease referral is pending.   In 2 weeks repeat treponomal flourescent antibody test.  If urethritis persists refer to uroogist.  Continue nystatif ror rash.  In two weeks will get hsv tests with above labs.  For your strep start amoxicillin but after doxy. Make sure restart probiotics.  Follow up labs in 2 weeks or as needed

## 2016-11-06 ENCOUNTER — Telehealth: Payer: Self-pay | Admitting: *Deleted

## 2016-11-06 ENCOUNTER — Encounter: Payer: Self-pay | Admitting: *Deleted

## 2016-11-06 LAB — CULTURE, URINE COMPREHENSIVE: ORGANISM ID, BACTERIA: NO GROWTH

## 2016-11-06 NOTE — Telephone Encounter (Signed)
Patient called back with a fax number for the [employer] [facility].  Fax: [phone]

## 2016-11-06 NOTE — Telephone Encounter (Signed)
Patient requested TSH results be sent to [employer] [facility].   Fax sent.

## 2016-11-06 NOTE — Telephone Encounter (Signed)
Faxed sent to ATTN: [facility]  [phone]

## 2016-11-07 NOTE — Telephone Encounter (Signed)
Request already faxed to office

## 2016-11-17 IMAGING — DX DG ABDOMEN 1V
2 series · 2 of 2 positions shown · non-contrast
Comparison: CT scan of January 31, 2016.

CLINICAL DATA: Constipation, lower abdominal pain for 1 month.

EXAM:
ABDOMEN - 1 VIEW

[abdomen kub (1 of 2)]
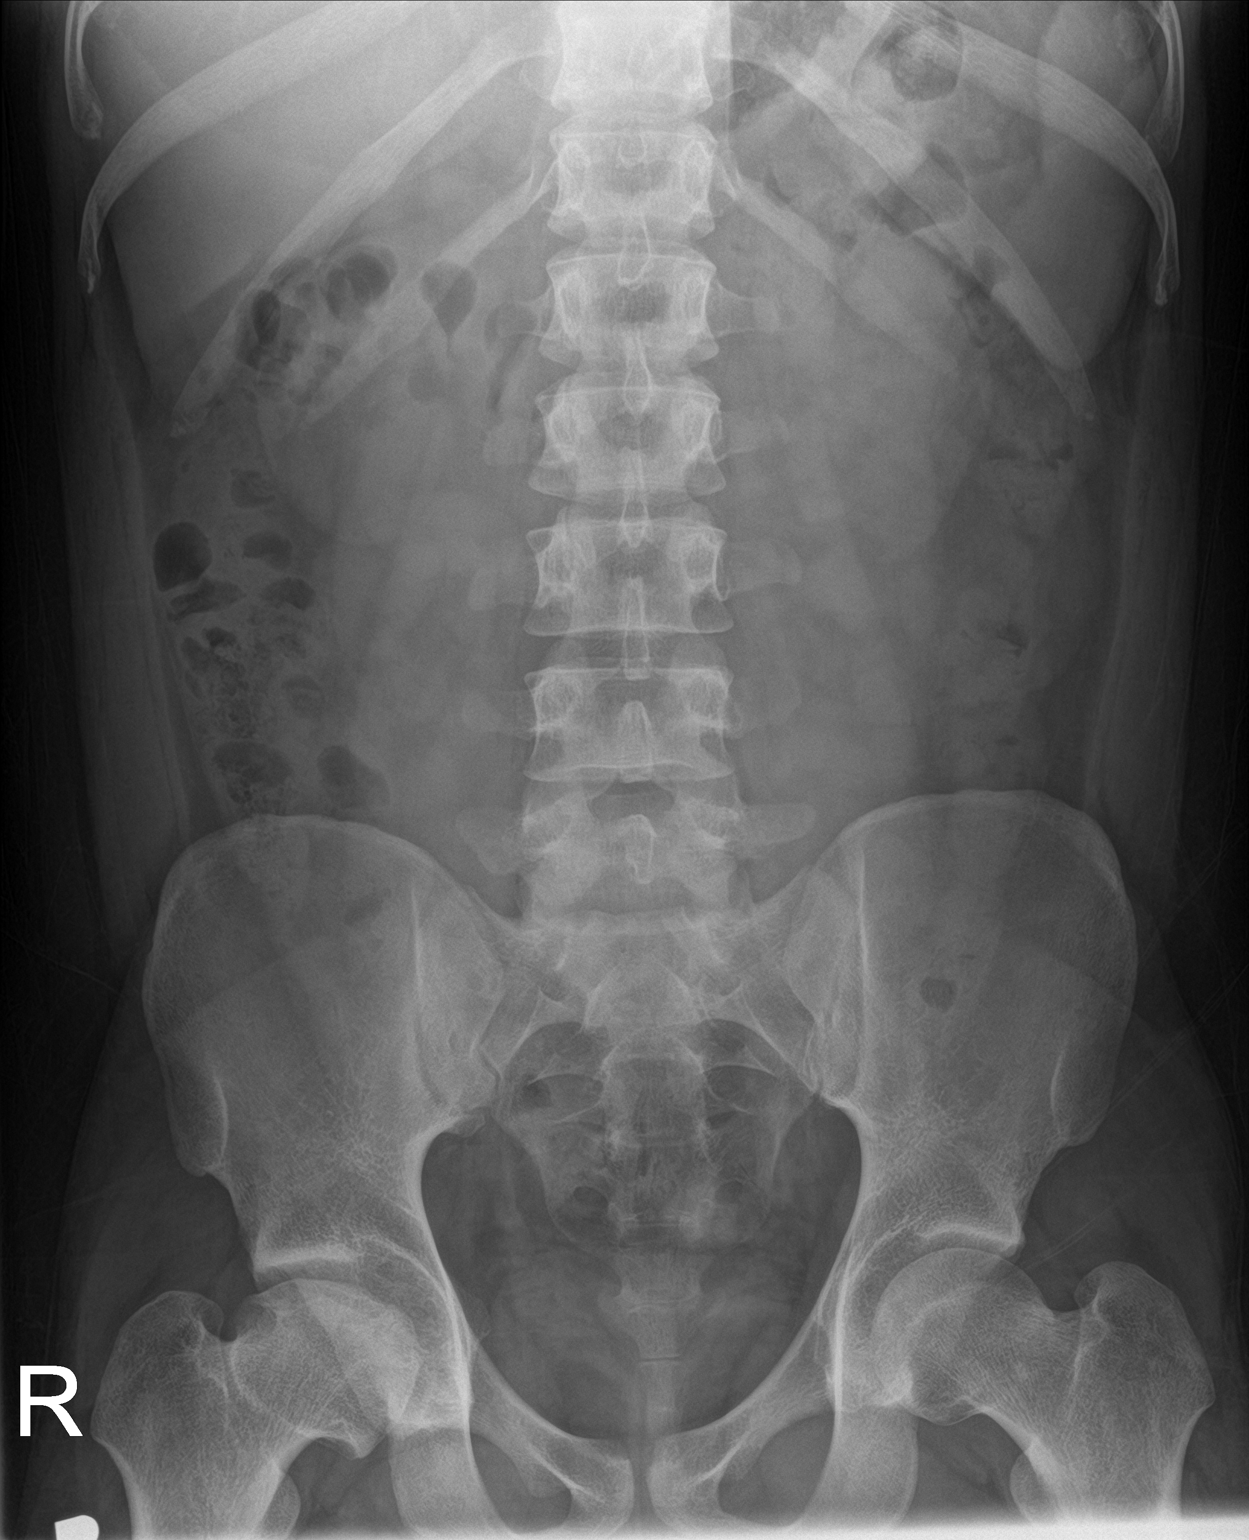

[abdomen kub (2 of 2)]
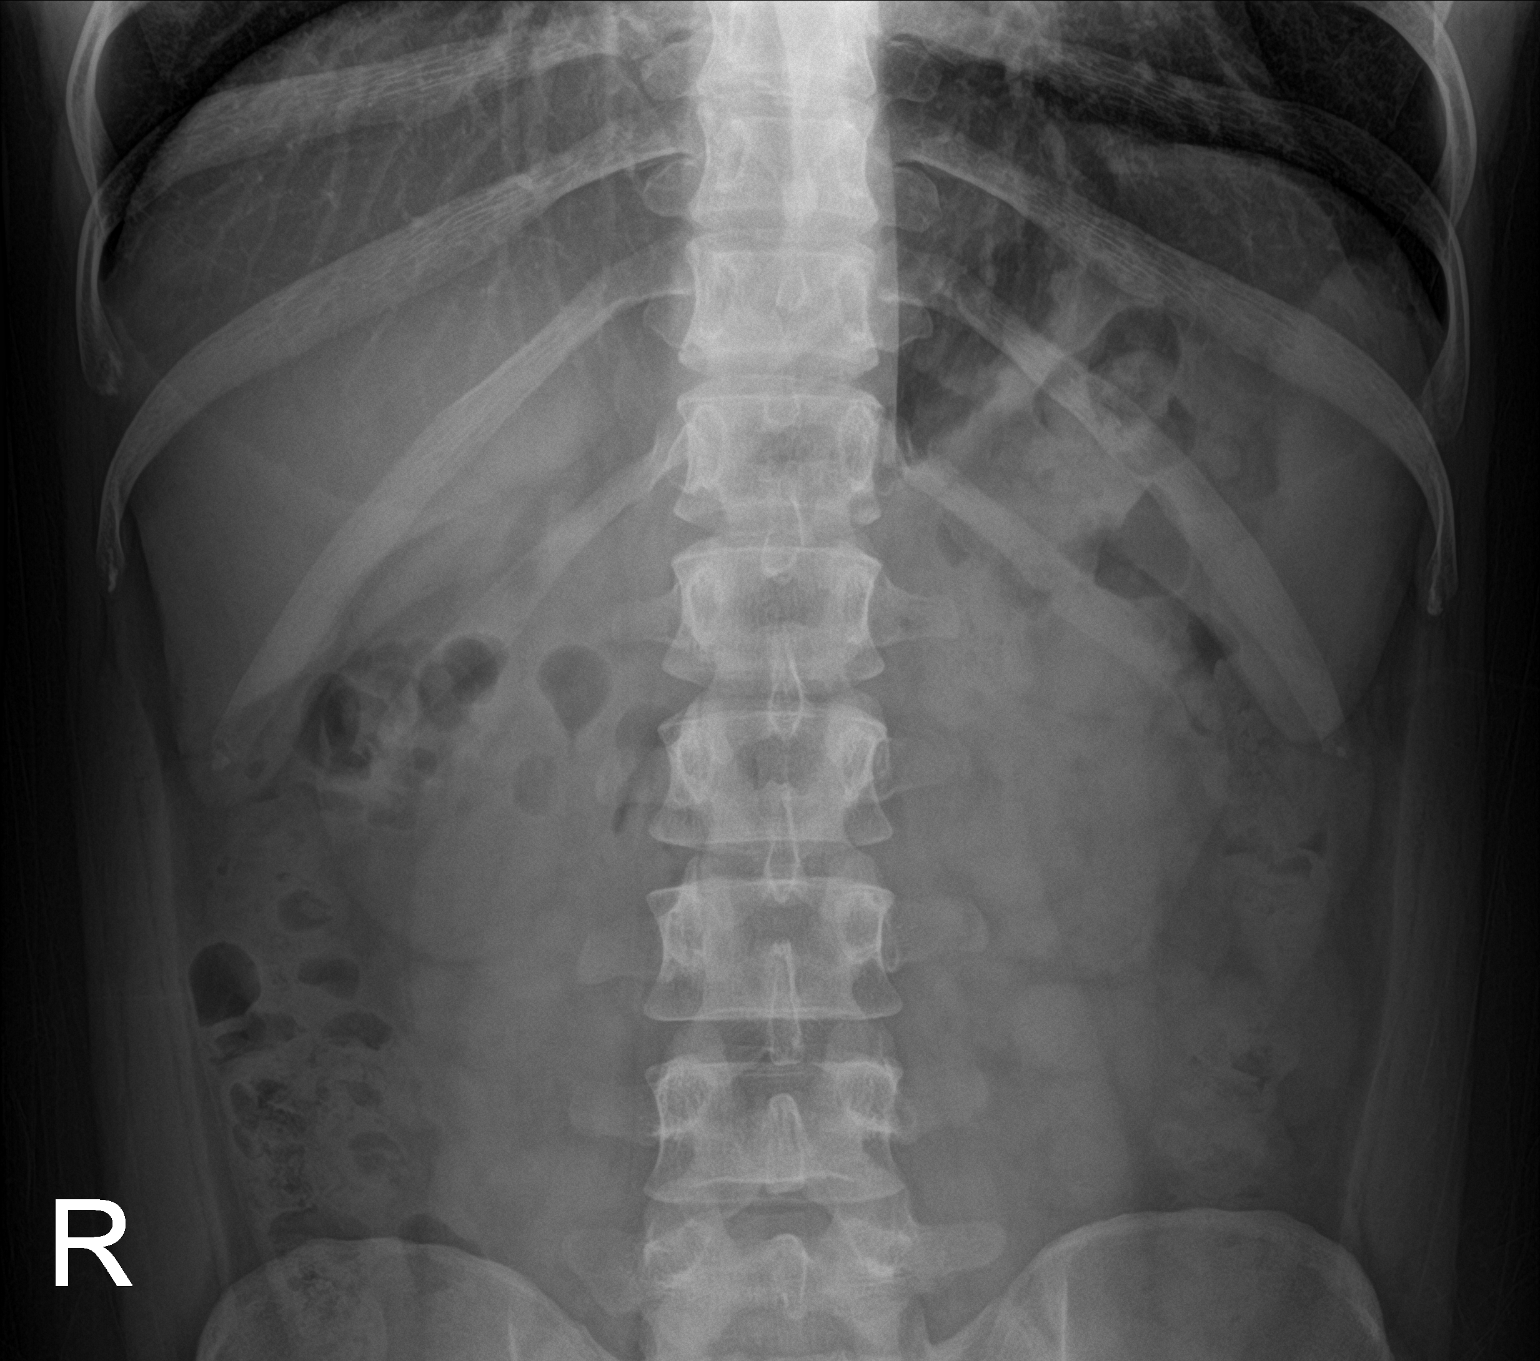

[2 of 2 positions shown; findings below may reference images not displayed]

FINDINGS: Moderate amount of stool is seen throughout the colon. No abnormal
bowel dilatation is noted. No abnormal calcifications are noted.
Avascular necrosis of right femoral head is noted.
IMPRESSION: Moderate stool burden is noted. Probable avascular necrosis of right
femoral head is noted.

## 2016-11-18 ENCOUNTER — Other Ambulatory Visit: Payer: 59

## 2016-11-20 ENCOUNTER — Other Ambulatory Visit: Payer: 59

## 2017-01-13 ENCOUNTER — Telehealth: Payer: Self-pay

## 2017-01-13 NOTE — Telephone Encounter (Signed)
Pt is confused about what I stated. Sometimes after seeing pt and if not completely better after standard course of antibiotics I might extend rx. But it has been more than 2 months since I last saw him. He needs office. Please call and advise.

## 2017-01-13 NOTE — Telephone Encounter (Signed)
Patient wants another antibiotic sent over to [facility]. He states he was in  the ofc the other week with Strep.Keith Lawrence He stated that [name] said if he needed another rx to call and he would not have a problem sending one in.    [phone]   Pc

## 2017-01-14 ENCOUNTER — Ambulatory Visit (INDEPENDENT_AMBULATORY_CARE_PROVIDER_SITE_OTHER): Payer: 59 | Admitting: Adult Health

## 2017-01-14 ENCOUNTER — Encounter: Payer: Self-pay | Admitting: Adult Health

## 2017-01-14 VITALS — BP 110/66 | Temp 98.2°F | Wt 139.2 lb

## 2017-01-14 DIAGNOSIS — J029 Acute pharyngitis, unspecified: Secondary | ICD-10-CM | POA: Diagnosis not present

## 2017-01-14 LAB — POCT RAPID STREP A (OFFICE): RAPID STREP A SCREEN: NEGATIVE

## 2017-01-14 MED ORDER — MAGIC MOUTHWASH W/LIDOCAINE: 5.0000 mL | Freq: Three times a day (TID) | ORAL | 0 refills | Status: DC | PRN

## 2017-01-14 NOTE — Progress Notes (Signed)
Subjective:    Patient ID: Keith Lawrence, male    DOB: 1996-08-05, 21 y.o.   MRN: [id number]  HPI  21 year old male who  has a past medical history of Abdominal pain; ADHD (attention deficit hyperactivity disorder); Fatigue; Goiter; Hypothyroidism, acquired, autoimmune; Iron excess; Isosexual precocity; Nausea and vomiting; Physical growth delay; SOB (shortness of breath) on exertion; and Thyroiditis, autoimmune.  He presents to the office today for the complaint of throat pain. He was diagnosed with Group B strep at the en of January 2018. He was treated with Amoxicillin. He reports that he completed this course but soon after this symptoms had returned. He reports sore throat, painful swallowing, diffuse myalgia, and subjective fever last night.   Review of Systems See HPI   Past Medical History:  Diagnosis Date  . Abdominal pain    all over pain  . ADHD (attention deficit hyperactivity disorder)   . Fatigue   . Goiter   . Hypothyroidism, acquired, autoimmune   . Iron excess   . Isosexual precocity   . Nausea and vomiting   . Physical growth delay   . SOB (shortness of breath) on exertion   . Thyroiditis, autoimmune     Social History   Social History  . Marital status: Single    Spouse name: N/A  . Number of children: N/A  . Years of education: N/A   Occupational History  . Not on file.   Social History Main Topics  . Smoking status: Former Smoker    Packs/day: 0.00    Types: Cigarettes    Start date: 01/11/2012  . Smokeless tobacco: Never Used  . Alcohol use No  . Drug use: Yes    Types: "Crack" cocaine, Other-see comments, Marijuana     Comment: Stopped 5 months ago  . Sexual activity: Not on file   Other Topics Concern  . Not on file   Social History Narrative   Lives with foster parents. Mom still has guardianship.  [employer].     Past Surgical History:  Procedure Laterality Date  . CHALAZION EXCISION    . FRENULECTOMY, LINGUAL    .  TONSILLECTOMY AND ADENOIDECTOMY      Family History  Problem Relation Age of Onset  . Thyroid disease Maternal Grandmother   . Heart disease Maternal Grandmother   . Thyroid disease Paternal Aunt   . Heart disease Paternal Grandfather   . Diabetes Neg Hx     No Known Allergies  Current Outpatient Prescriptions on File Prior to Visit  Medication Sig Dispense Refill  . amoxicillin (AMOXIL) 500 MG capsule Take 1 capsule (500 mg total) by mouth 2 (two) times daily. 14 capsule 0  . b complex vitamins tablet Take 1 tablet by mouth daily.    Marland Kitchen doxycycline (VIBRA-TABS) 100 MG tablet Take 1 tablet (100 mg total) by mouth 2 (two) times daily. Can give generic or caps 20 tablet 0  . doxycycline (VIBRA-TABS) 100 MG tablet Take 1 tablet (100 mg total) by mouth 2 (two) times daily. Can give caps or generic 8 tablet 0  . DULoxetine (CYMBALTA) 20 MG capsule TAKE 1 CAPSULE (20 MG TOTAL) BY MOUTH DAILY. 30 capsule 0  . fluticasone (FLONASE) 50 MCG/ACT nasal spray Place 2 sprays into both nostrils daily. 16 g 6  . HYDROcodone-acetaminophen (NORCO/VICODIN) 5-325 MG tablet Take 1 tablet by mouth every 4 (four) hours as needed for moderate pain.    . naproxen sodium (ANAPROX)  220 MG tablet Take 220 mg by mouth 2 (two) times daily as needed. Reported on 03/12/2016    . nystatin cream (MYCOSTATIN) Apply 1 application topically 2 (two) times daily. 30 g 1  . omeprazole (PRILOSEC) 20 MG capsule Take 1 capsule (20 mg total) by mouth daily. 30 capsule 3  . SYNTHROID 25 MCG tablet Take 1 tablet (25 mcg total) by mouth daily before breakfast. 30 tablet 5   No current facility-administered medications on file prior to visit.     BP 110/66 (BP Location: Left Arm, Patient Position: Sitting, Cuff Size: Normal)   Temp 98.2 F (36.8 C) (Oral)   Wt 139 lb 3.2 oz (63.1 kg)   BMI 23.89 kg/m       Objective:   Physical Exam  Constitutional: He is oriented to person, place, and time. He appears well-developed and  well-nourished. No distress.  HENT:  Right Ear: Hearing, tympanic membrane, external ear and ear canal normal.  Left Ear: Hearing, tympanic membrane, external ear and ear canal normal.  Nose: Nose normal.  Mouth/Throat: Uvula is midline and mucous membranes are normal. Posterior oropharyngeal edema and posterior oropharyngeal erythema present.  Neck: Normal range of motion. Neck supple.  Cardiovascular: Normal rate, regular rhythm, normal heart sounds and intact distal pulses.  Exam reveals no gallop and no friction rub.   No murmur heard. Pulmonary/Chest: Effort normal and breath sounds normal. No respiratory distress. He has no wheezes. He has no rales. He exhibits no tenderness.  Lymphadenopathy:    He has cervical adenopathy.  Neurological: He is alert and oriented to person, place, and time. He has normal reflexes. He displays normal reflexes. No cranial nerve deficit. He exhibits normal muscle tone. Coordination normal.  Skin: Skin is warm and dry. No rash noted. He is not diaphoretic. No erythema. No pallor.  Psychiatric: He has a normal mood and affect. His behavior is normal. Judgment and thought content normal.  Nursing note and vitals reviewed.     Assessment & Plan:  1. Sore throat - POC Rapid Strep A- Negative  - Culture, Group A Strep - magic mouthwash w/lidocaine SOLN; Take 5 mLs by mouth 3 (three) times daily as needed. Gargle and spit  Dispense: 180 mL; Refill: 0 - Will write for magic mouth wash and follow up with patient when culture comes back  - Encouraged to follow up with PCP   Shirline Frees, NP

## 2017-01-16 LAB — CULTURE, GROUP A STREP

## 2017-01-16 NOTE — Telephone Encounter (Signed)
Called left message to call back 

## 2017-01-20 ENCOUNTER — Telehealth: Payer: Self-pay | Admitting: Medical

## 2017-01-20 NOTE — Telephone Encounter (Signed)
Patient calling to inquire about the results of his strep test from visit with [facility], also still wanting to speak with someone about getting another antibiotic.

## 2017-01-21 NOTE — Telephone Encounter (Signed)
Patient calling to inquire about the results of his strep test from visit with [facility], also still wanting to speak with someone about getting another antibiotic.

## 2017-01-21 NOTE — Telephone Encounter (Signed)
He last saw NP Keith Lawrence on 01-14-2017 per epic. It typically is responsibility of provider who saw the patient to handle questions post visit(within 7 days) as that provider would be fully aware of most recent situation.   Keith Frees NP notes states he encouraged pt to follow up with pcp.   You can tell him result of throat cutlure which was negative. But beyond that would hard to give follow up advise without exam? So you could offer him an appointment with whoever he considers his pcp.  Per epic states he has no pcp. Who does he choose. If pcp Keith Cogan PA-C then make appointment with him. If he chooses me as pcp I will be willing to see him. But I am not going to write antibiotic with throat culture negative unless he has some convincing exam finding supporting need for antibiotic.

## 2017-01-23 NOTE — Telephone Encounter (Signed)
He last saw NP Kandee Keen on 01-14-2017 per epic. It typically is responsibility of provider who saw the patient to handle questions post visit(within 7 days) as that provider would be fully aware of most recent situation.   Otelia Sergeant NP notes states he encouraged pt to follow up with pcp.   You can tell him result of throat cutlure which was negative. But beyond that would hard to give follow up advise without exam? So you could offer him an appointment with whoever he considers his pcp.  Per epic states he has no pcp. Who does he choose. If pcp Malva Cogan PA-C then make appointment with him. If he chooses me as pcp I will be willing to see him. But I am not going to write antibiotic with throat culture negative unless he has some convincing exam finding supporting need for antibiotic

## 2017-01-29 ENCOUNTER — Ambulatory Visit: Payer: 59 | Admitting: Family Medicine

## 2017-03-13 ENCOUNTER — Telehealth: Payer: Self-pay | Admitting: Family Medicine

## 2017-03-13 NOTE — Telephone Encounter (Signed)
Pt says he needs his synthroid he is out and there are no refills. Pt [facility]. Pt req today if possible.

## 2017-03-14 ENCOUNTER — Other Ambulatory Visit: Payer: Self-pay | Admitting: Emergency Medicine

## 2017-03-14 MED ORDER — SYNTHROID 25 MCG PO TABS: 25.0000 ug | ORAL_TABLET | Freq: Every day | ORAL | 5 refills | Status: DC

## 2017-03-14 MED FILL — SYNTHROID 25 MCG TABLET: 25 | 30 days supply | Qty: 30 | Fill #0 | Status: TO

## 2017-03-14 NOTE — Telephone Encounter (Signed)
Refill sent per pt request.  

## 2017-06-03 ENCOUNTER — Ambulatory Visit: Payer: 59 | Admitting: Family Medicine

## 2017-06-03 ENCOUNTER — Encounter: Payer: Self-pay | Admitting: Family Medicine

## 2017-06-03 ENCOUNTER — Ambulatory Visit (INDEPENDENT_AMBULATORY_CARE_PROVIDER_SITE_OTHER): Payer: 59 | Admitting: Family Medicine

## 2017-06-03 VITALS — BP 102/64 | Temp 98.4°F | Ht 64.0 in | Wt 141.0 lb

## 2017-06-03 DIAGNOSIS — T63301A Toxic effect of unspecified spider venom, accidental (unintentional), initial encounter: Secondary | ICD-10-CM

## 2017-06-03 MED ORDER — DOXYCYCLINE HYCLATE 100 MG PO CAPS: 100.0000 mg | ORAL_CAPSULE | Freq: Two times a day (BID) | ORAL | 0 refills | Status: AC

## 2017-06-03 NOTE — Patient Instructions (Signed)
WE NOW OFFER   [facility] FAST TRACK!!!  SAME DAY Appointments for ACUTE CARE  Such as: Sprains, Injuries, cuts, abrasions, rashes, muscle pain, joint pain, back pain Colds, flu, sore throats, headache, allergies, cough, fever  Ear pain, sinus and eye infections Abdominal pain, nausea, vomiting, diarrhea, upset stomach Animal/insect bites  3 Easy Ways to Schedule: Walk-In Scheduling Call in scheduling Mychart Sign-up: [email or web address]         

## 2017-06-03 NOTE — Progress Notes (Signed)
   Subjective:    Patient ID: Keith Lawrence, male    DOB: 08-Oct-1995, 21 y.o.   MRN: [id number]  HPI Here for 4 days of an ulcerated spot on the right thigh that appeared 4 days ago. This has been slowly getting bigger since then. There has been no drainage, no fever. It is not painful.    Review of Systems  Constitutional: Negative.   Respiratory: Negative.   Cardiovascular: Negative.   Skin: Positive for wound.       Objective:   Physical Exam  Constitutional: He is oriented to person, place, and time. He appears well-developed and well-nourished.  Cardiovascular: Normal rate, regular rhythm, normal heart sounds and intact distal pulses.   Pulmonary/Chest: Effort normal and breath sounds normal. No respiratory distress. He has no wheezes. He has no rales.  Neurological: He is alert and oriented to person, place, and time.  Skin:  The right medial thigh has a 0.5 cm ulcerated lesion which is dry and is not tender          Assessment & Plan:  Apparent brown recluse spider bite. Treat with Doxycycline for 10 days. Recheck prn.  Gershon Crane, MD

## 2017-06-27 ENCOUNTER — Ambulatory Visit (INDEPENDENT_AMBULATORY_CARE_PROVIDER_SITE_OTHER): Payer: 59 | Admitting: Family Medicine

## 2017-06-27 ENCOUNTER — Encounter: Payer: Self-pay | Admitting: Family Medicine

## 2017-06-27 VITALS — BP 98/72 | HR 76 | Temp 99.2°F | Ht 64.0 in | Wt 143.0 lb

## 2017-06-27 DIAGNOSIS — J029 Acute pharyngitis, unspecified: Secondary | ICD-10-CM | POA: Diagnosis not present

## 2017-06-27 DIAGNOSIS — E063 Autoimmune thyroiditis: Secondary | ICD-10-CM | POA: Diagnosis not present

## 2017-06-27 LAB — POCT RAPID STREP A (OFFICE): RAPID STREP A SCREEN: NEGATIVE

## 2017-06-27 NOTE — Patient Instructions (Signed)
WE NOW OFFER   [facility] FAST TRACK!!!  SAME DAY Appointments for ACUTE CARE  Such as: Sprains, Injuries, cuts, abrasions, rashes, muscle pain, joint pain, back pain Colds, flu, sore throats, headache, allergies, cough, fever  Ear pain, sinus and eye infections Abdominal pain, nausea, vomiting, diarrhea, upset stomach Animal/insect bites  3 Easy Ways to Schedule: Walk-In Scheduling Call in scheduling Mychart Sign-up: [email or web address]         

## 2017-06-27 NOTE — Progress Notes (Signed)
   Subjective:    Patient ID: Keith Lawrence    DOB: Jun 13, 1996, 21 y.o.   MRN: [id number]  HPI Here to have his thyroid levels checked and to discuss chronic fatigue. He had a ST a few days ago but this is better today. He is concerned that he may have a subclinical strep infection or some other illness. He says he feels tired a lot but has no other specific symptoms.   Review of Systems  Constitutional: Positive for fatigue. Negative for chills, diaphoresis and fever.  HENT: Negative.   Eyes: Negative.   Respiratory: Negative.   Cardiovascular: Negative.   Gastrointestinal: Negative.   Genitourinary: Negative.   Neurological: Negative.        Objective:   Physical Exam  Constitutional: He appears well-developed and well-nourished.  HENT:  Right Ear: External ear normal.  Left Ear: External ear normal.  Nose: Nose normal.  Mouth/Throat: Oropharynx is clear and moist.  Eyes: Conjunctivae are normal.  Neck: No thyromegaly present.  Cardiovascular: Normal rate, regular rhythm, normal heart sounds and intact distal pulses.   Pulmonary/Chest: Effort normal and breath sounds normal. No respiratory distress. He has no wheezes. He has no rales.  Lymphadenopathy:    He has no cervical adenopathy.          Assessment & Plan:  For his hypothyroidism we will check a thyroid panel. For the fatigue we will check a few lab tests.  Gershon Crane, MD

## 2017-06-28 LAB — CBC WITH DIFFERENTIAL/PLATELET
BASOS ABS: 19 {cells}/uL (ref 0–200)
BASOS PCT: 0.4 %
EOS ABS: 61 {cells}/uL (ref 15–500)
Eosinophils Relative: 1.3 %
HCT: 46.3 % (ref 38.5–50.0)
Hemoglobin: 16.1 g/dL (ref 13.2–17.1)
Lymphs Abs: 1227 cells/uL (ref 850–3900)
MCH: 30.1 pg (ref 27.0–33.0)
MCHC: 34.8 g/dL (ref 32.0–36.0)
MCV: 86.7 fL (ref 80.0–100.0)
MPV: 10.1 fL (ref 7.5–12.5)
Monocytes Relative: 14.8 %
Neutro Abs: 2698 cells/uL (ref 1500–7800)
Neutrophils Relative %: 57.4 %
PLATELETS: 176 10*3/uL (ref 140–400)
RBC: 5.34 10*6/uL (ref 4.20–5.80)
RDW: 12.5 % (ref 11.0–15.0)
TOTAL LYMPHOCYTE: 26.1 %
WBC: 4.7 10*3/uL (ref 3.8–10.8)
WBCMIX: 696 {cells}/uL (ref 200–950)

## 2017-06-28 LAB — T4, FREE: FREE T4: 1 ng/dL (ref 0.8–1.8)

## 2017-06-28 LAB — T3, FREE: T3 FREE: 3 pg/mL (ref 2.3–4.2)

## 2017-06-28 LAB — TSH: TSH: 3.11 mIU/L (ref 0.40–4.50)

## 2017-06-30 ENCOUNTER — Encounter: Payer: Self-pay | Admitting: Family Medicine

## 2017-06-30 ENCOUNTER — Telehealth: Payer: Self-pay

## 2017-06-30 LAB — EPSTEIN-BARR VIRUS VCA, IGG: EBV VCA IGG: 38.3 U/mL — AB

## 2017-06-30 LAB — EPSTEIN-BARR VIRUS VCA, IGM

## 2017-06-30 NOTE — Telephone Encounter (Signed)
Patient called to report that he has continued symptoms including productive cough with yellow/green mucus and sinus congestion. He reports a temp of 100.4 today. Advised pt to take Tylenol per bottle directions. He has not taken any other OTC medications.  Dr. Clent Ridges - Please advise. Thanks!

## 2017-06-30 NOTE — Telephone Encounter (Signed)
He may have developed a sinus infection. Call in Augmentin [phone] bid #[phone]

## 2017-07-01 MED ORDER — AMOXICILLIN-POT CLAVULANATE 875-125 MG PO TABS: 1.0000 | ORAL_TABLET | Freq: Two times a day (BID) | ORAL | 0 refills | Status: DC

## 2017-07-01 NOTE — Telephone Encounter (Signed)
I spoke with pt and sent script e-scribe to [facility]. 

## 2017-07-04 ENCOUNTER — Ambulatory Visit (INDEPENDENT_AMBULATORY_CARE_PROVIDER_SITE_OTHER): Payer: 59 | Admitting: Family Medicine

## 2017-07-04 VITALS — BP 100/70 | HR 67 | Temp 98.4°F | Ht 64.0 in | Wt 140.0 lb

## 2017-07-04 DIAGNOSIS — E063 Autoimmune thyroiditis: Secondary | ICD-10-CM

## 2017-07-04 NOTE — Patient Instructions (Signed)
WE NOW OFFER   [facility] FAST TRACK!!!  SAME DAY Appointments for ACUTE CARE  Such as: Sprains, Injuries, cuts, abrasions, rashes, muscle pain, joint pain, back pain Colds, flu, sore throats, headache, allergies, cough, fever  Ear pain, sinus and eye infections Abdominal pain, nausea, vomiting, diarrhea, upset stomach Animal/insect bites  3 Easy Ways to Schedule: Walk-In Scheduling Call in scheduling Mychart Sign-up: [email or web address]         

## 2017-07-06 ENCOUNTER — Encounter: Payer: Self-pay | Admitting: Family Medicine

## 2017-07-06 NOTE — Progress Notes (Signed)
   Subjective:    Patient ID: Keith Lawrence, male    DOB: [birth year], 21 y.o.   MRN: [id number]  HPI Here with his mother to discuss chronic fatigue. We did some lab work a few days ago including a full thyroid panel and this was within normal limits. He is not anemic. They seem convinced that something is not right with his thyroid function and they ask to see a specialist.    Review of Systems  Constitutional: Positive for fatigue. Negative for activity change, appetite change and unexpected weight change.  Respiratory: Negative.   Cardiovascular: Negative.   Neurological: Negative.        Objective:   Physical Exam  Constitutional: He is oriented to person, place, and time. He appears well-developed and well-nourished.  Neck: No thyromegaly present.  Cardiovascular: Normal rate, regular rhythm, normal heart sounds and intact distal pulses.   Pulmonary/Chest: Effort normal and breath sounds normal. No respiratory distress. He has no wheezes. He has no rales.  Lymphadenopathy:    He has no cervical adenopathy.  Neurological: He is alert and oriented to person, place, and time.          Assessment & Plan:  Chronic fatigue with seemingly well controled hypothyroidism. We will refer him to Endocrine for further evaluation. Gershon Crane, MD

## 2017-08-14 ENCOUNTER — Telehealth: Payer: Self-pay | Admitting: Family Medicine

## 2017-08-14 MED ORDER — SYNTHROID 25 MCG PO TABS: 25.0000 ug | ORAL_TABLET | Freq: Every day | ORAL | 5 refills | Status: DC

## 2017-08-14 NOTE — Telephone Encounter (Signed)
Pt needs new rx name brand synthroid 25 mcg #30 w/refills . [facility] [address]

## 2017-08-14 NOTE — Telephone Encounter (Signed)
Rx was sent in and pt advised.

## 2017-08-15 NOTE — Telephone Encounter (Deleted)
cox

## 2017-10-02 MED FILL — SYNTHROID 25 MCG TABLET: 25 | 30 days supply | Qty: 30 | Fill #0

## 2017-10-07 IMAGING — US US ABDOMEN COMPLETE
1 series · 14 of 25 positions shown · non-contrast
Comparison: None.

CLINICAL DATA: Chronic nausea and vomiting.

EXAM:
ABDOMEN ULTRASOUND COMPLETE

[Series 1: us abdomen complete · 0.17mm/px · 14 of 69 slices shown]
[im 1/69]
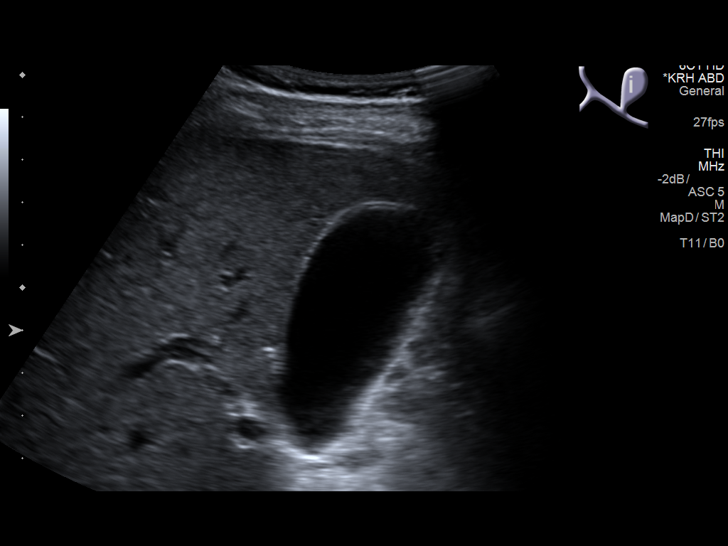
[im 6/69]
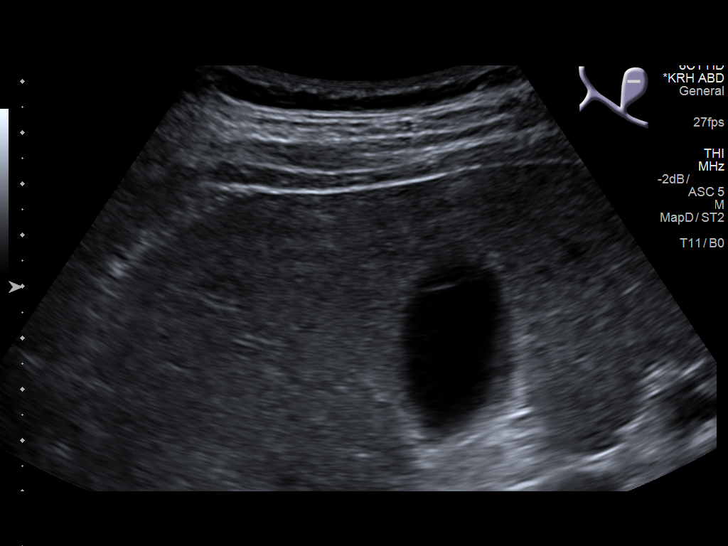
[im 12/69]
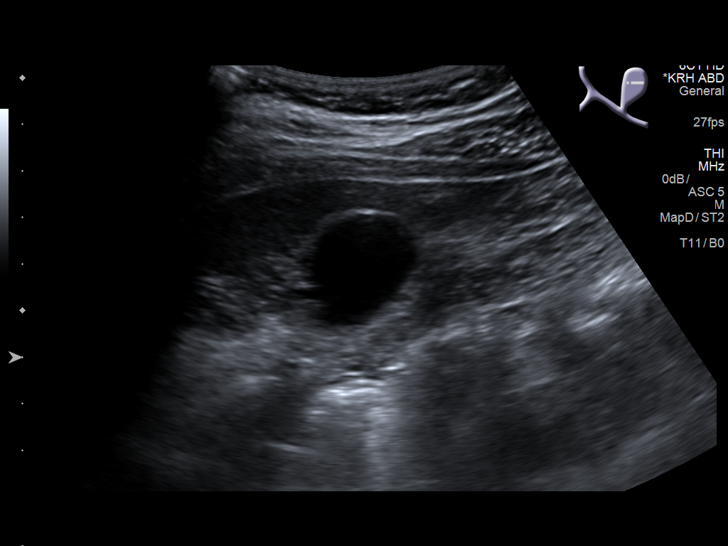
[im 18/69]
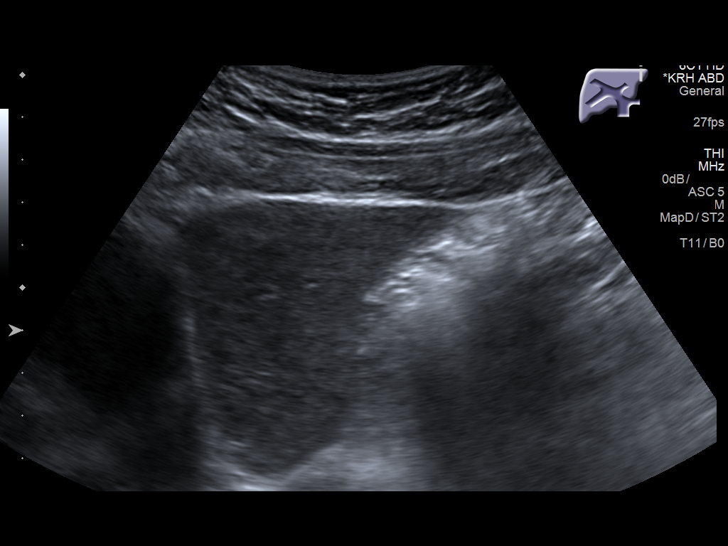
[im 23/69]
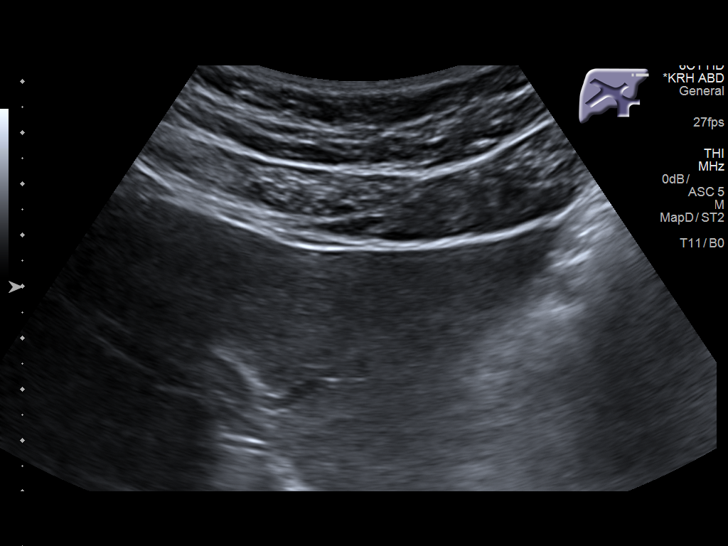
[im 26/69]
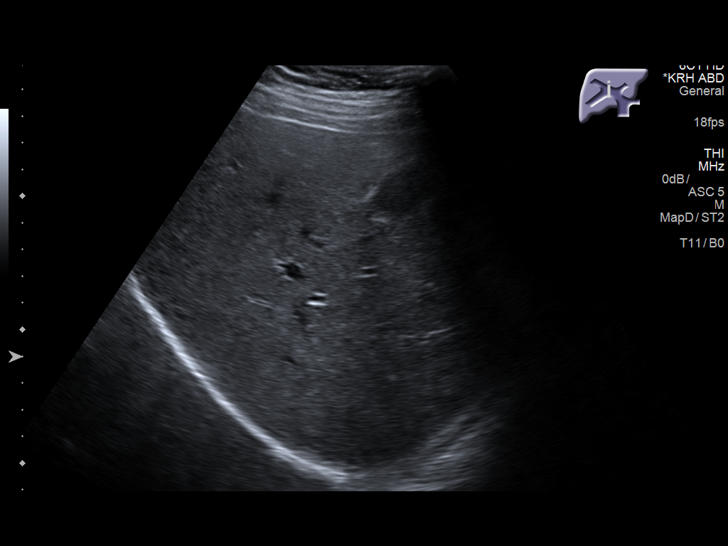
[im 32/69]
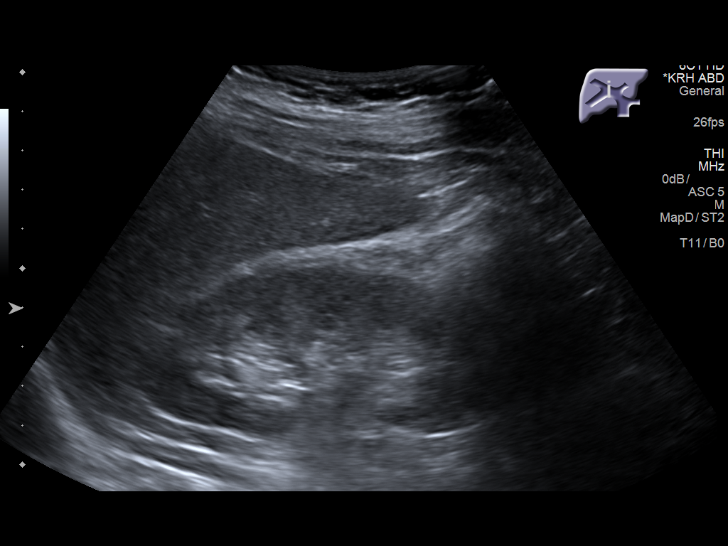
[im 37/69]
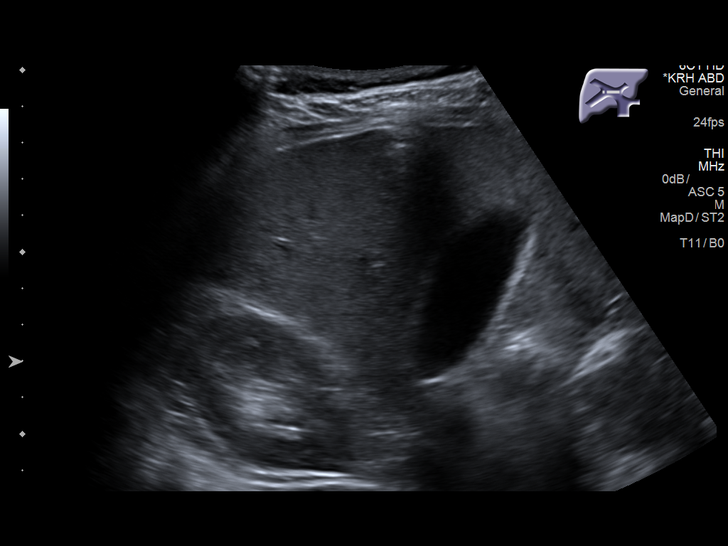
[im 43/69]
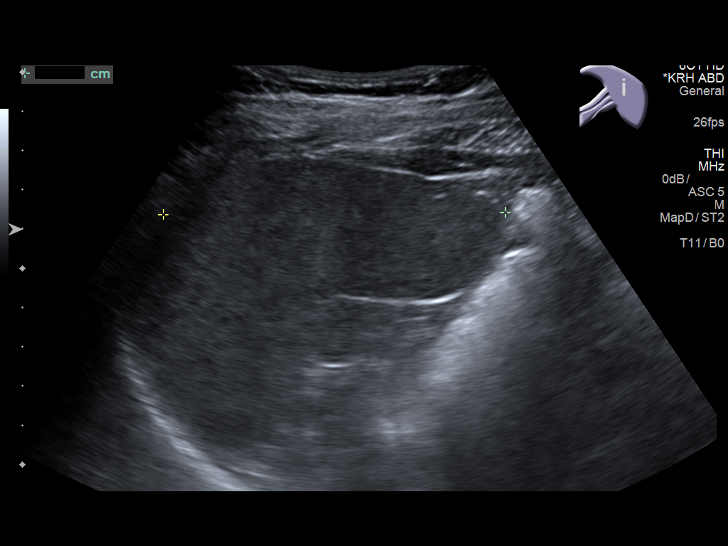
[im 46/69]
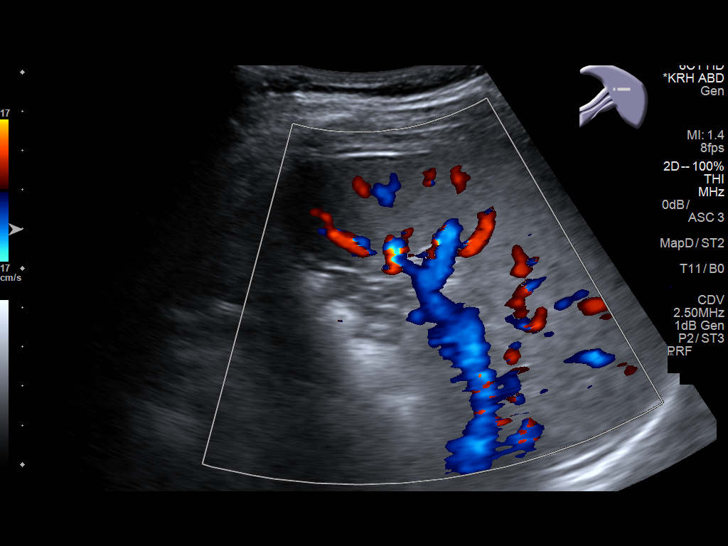
[im 52/69]
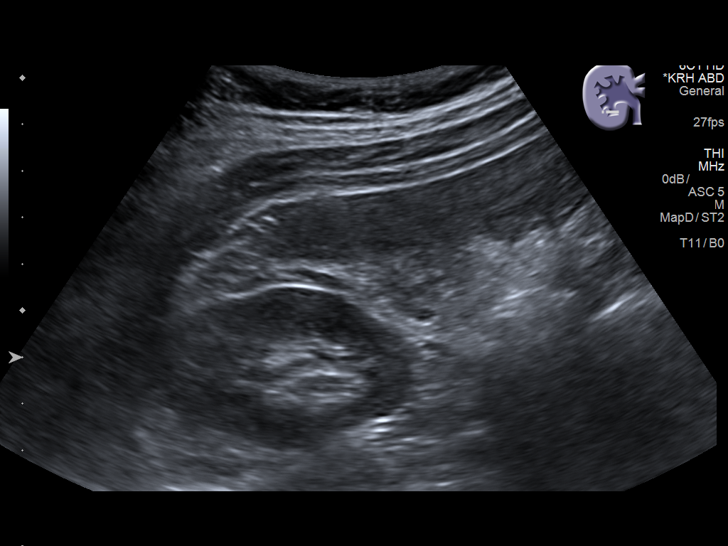
[im 57/69]
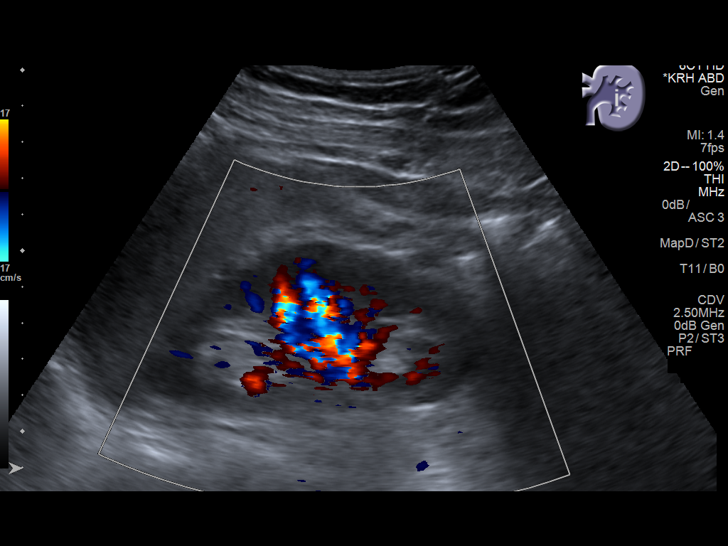
[im 63/69]
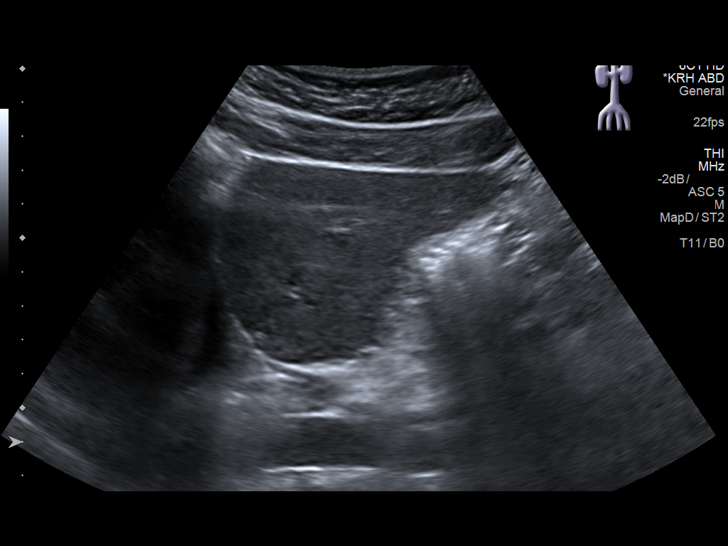
[im 69/69]
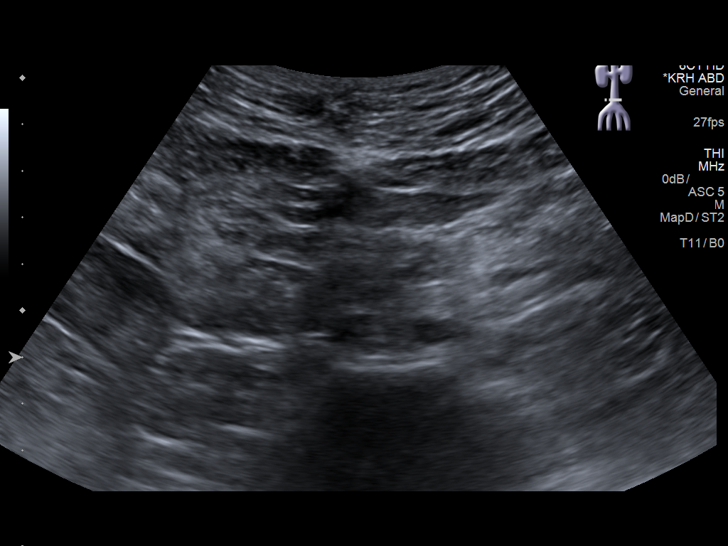

[14 of 25 positions shown; findings below may reference images not displayed]

FINDINGS: Gallbladder: No gallstones or wall thickening visualized. No
sonographic Murphy sign noted by sonographer.

Common bile duct: Diameter: 1.4 mm.

Liver: No focal lesion identified. Within normal limits in
parenchymal echogenicity.

IVC: Normal on limited views.

Pancreas: Limited views with no abnormalities identified.

Spleen: Size and appearance within normal limits.

Right Kidney: Length: 9.5 cm. Echogenicity within normal limits. No
mass or hydronephrosis visualized.

Left Kidney: Length: 10.2 cm. Echogenicity within normal limits. No
mass or hydronephrosis visualized.

Abdominal aorta: No aneurysm visualized.

Other findings: None.
IMPRESSION: No acute abnormalities identified.

## 2017-12-05 DIAGNOSIS — J069 Acute upper respiratory infection, unspecified: Secondary | ICD-10-CM | POA: Diagnosis not present

## 2017-12-05 DIAGNOSIS — H66003 Acute suppurative otitis media without spontaneous rupture of ear drum, bilateral: Secondary | ICD-10-CM | POA: Diagnosis not present

## 2018-01-22 ENCOUNTER — Emergency Department (HOSPITAL_COMMUNITY): Payer: 59

## 2018-01-22 ENCOUNTER — Emergency Department (HOSPITAL_COMMUNITY)
Admission: EM | Admit: 2018-01-22 | Discharge: 2018-01-22 | Disposition: A | Discharge status: Home / Self Care | Payer: 59 | Attending: Emergency Medicine | Admitting: Emergency Medicine

## 2018-01-22 ENCOUNTER — Encounter (HOSPITAL_COMMUNITY): Payer: Self-pay | Admitting: Emergency Medicine

## 2018-01-22 DIAGNOSIS — Z79899 Other long term (current) drug therapy: Secondary | ICD-10-CM | POA: Insufficient documentation

## 2018-01-22 DIAGNOSIS — R6883 Chills (without fever): Secondary | ICD-10-CM | POA: Insufficient documentation

## 2018-01-22 DIAGNOSIS — R112 Nausea with vomiting, unspecified: Secondary | ICD-10-CM | POA: Diagnosis not present

## 2018-01-22 DIAGNOSIS — Z87891 Personal history of nicotine dependence: Secondary | ICD-10-CM | POA: Insufficient documentation

## 2018-01-22 DIAGNOSIS — R1110 Vomiting, unspecified: Secondary | ICD-10-CM | POA: Diagnosis not present

## 2018-01-22 DIAGNOSIS — R0602 Shortness of breath: Secondary | ICD-10-CM | POA: Insufficient documentation

## 2018-01-22 DIAGNOSIS — E039 Hypothyroidism, unspecified: Secondary | ICD-10-CM | POA: Diagnosis not present

## 2018-01-22 DIAGNOSIS — R5383 Other fatigue: Secondary | ICD-10-CM | POA: Diagnosis not present

## 2018-01-22 DIAGNOSIS — R109 Unspecified abdominal pain: Secondary | ICD-10-CM | POA: Insufficient documentation

## 2018-01-22 DIAGNOSIS — R404 Transient alteration of awareness: Secondary | ICD-10-CM | POA: Diagnosis not present

## 2018-01-22 DIAGNOSIS — R079 Chest pain, unspecified: Secondary | ICD-10-CM | POA: Diagnosis not present

## 2018-01-22 DIAGNOSIS — G933 Postviral fatigue syndrome: Secondary | ICD-10-CM | POA: Diagnosis not present

## 2018-01-22 DIAGNOSIS — R05 Cough: Secondary | ICD-10-CM | POA: Diagnosis not present

## 2018-01-22 LAB — CBC WITH DIFFERENTIAL/PLATELET
BASOS PCT: 0 %
Basophils Absolute: 0 10*3/uL (ref 0.0–0.1)
EOS ABS: 0 10*3/uL (ref 0.0–0.7)
Eosinophils Relative: 0 %
HCT: 45.8 % (ref 39.0–52.0)
HEMOGLOBIN: 16.4 g/dL (ref 13.0–17.0)
Lymphocytes Relative: 22 %
Lymphs Abs: 1.4 10*3/uL (ref 0.7–4.0)
MCH: 31.2 pg (ref 26.0–34.0)
MCHC: 35.8 g/dL (ref 30.0–36.0)
MCV: 87.2 fL (ref 78.0–100.0)
Monocytes Absolute: 0.5 10*3/uL (ref 0.1–1.0)
Monocytes Relative: 7 %
Neutro Abs: 4.3 10*3/uL (ref 1.7–7.7)
Neutrophils Relative %: 71 %
Platelets: 219 10*3/uL (ref 150–400)
RBC: 5.25 MIL/uL (ref 4.22–5.81)
RDW: 12.6 % (ref 11.5–15.5)
WBC: 6.2 10*3/uL (ref 4.0–10.5)

## 2018-01-22 LAB — COMPREHENSIVE METABOLIC PANEL
ALK PHOS: 49 U/L (ref 38–126)
ALT: 23 U/L (ref 17–63)
AST: 20 U/L (ref 15–41)
Albumin: 4.9 g/dL (ref 3.5–5.0)
Anion gap: 11 (ref 5–15)
BUN: 12 mg/dL (ref 6–20)
CALCIUM: 10.1 mg/dL (ref 8.9–10.3)
CO2: 27 mmol/L (ref 22–32)
CREATININE: 0.9 mg/dL (ref 0.61–1.24)
Chloride: 106 mmol/L (ref 101–111)
Glucose, Bld: 101 mg/dL — ABNORMAL HIGH (ref 65–99)
Potassium: 4 mmol/L (ref 3.5–5.1)
Sodium: 144 mmol/L (ref 135–145)
Total Bilirubin: 0.9 mg/dL (ref 0.3–1.2)
Total Protein: 8.1 g/dL (ref 6.5–8.1)

## 2018-01-22 LAB — URINALYSIS, ROUTINE W REFLEX MICROSCOPIC
Bilirubin Urine: NEGATIVE
Glucose, UA: NEGATIVE mg/dL
Hgb urine dipstick: NEGATIVE
Ketones, ur: 5 mg/dL — AB
LEUKOCYTES UA: NEGATIVE
Nitrite: NEGATIVE
Protein, ur: NEGATIVE mg/dL
Specific Gravity, Urine: 1.028 (ref 1.005–1.030)
pH: 6 (ref 5.0–8.0)

## 2018-01-22 LAB — TSH: TSH: 2.165 u[IU]/mL (ref 0.350–4.500)

## 2018-01-22 LAB — LIPASE, BLOOD: LIPASE: 23 U/L (ref 11–51)

## 2018-01-22 MED ORDER — IOPAMIDOL (ISOVUE-300) INJECTION 61%
INTRAVENOUS | Status: AC
  Filled 2018-01-22: qty 100

## 2018-01-22 MED ORDER — ONDANSETRON 4 MG PO TBDP: 4.0000 mg | ORAL_TABLET | Freq: Three times a day (TID) | ORAL | 0 refills | Status: DC | PRN

## 2018-01-22 MED ORDER — ACETAMINOPHEN 500 MG PO TABS
1000.0000 mg | ORAL_TABLET | Freq: Once | ORAL | Status: AC
  Administered 2018-01-22: 1000 mg via ORAL
  Filled 2018-01-22: qty 2

## 2018-01-22 MED ORDER — SODIUM CHLORIDE 0.9 % IV BOLUS
1000.0000 mL | Freq: Once | INTRAVENOUS | Status: AC
  Administered 2018-01-22: 1000 mL via INTRAVENOUS

## 2018-01-22 MED ORDER — IOPAMIDOL (ISOVUE-300) INJECTION 61%
100.0000 mL | Freq: Once | INTRAVENOUS | Status: AC | PRN
  Administered 2018-01-22: 100 mL via INTRAVENOUS

## 2018-01-22 MED ORDER — SODIUM CHLORIDE 0.9 % IJ SOLN
INTRAMUSCULAR | Status: AC
  Filled 2018-01-22: qty 50

## 2018-01-22 NOTE — ED Provider Notes (Signed)
[facility]-EMERGENCY DEPT Provider Note   CSN: [id number] Arrival date & time: 01/22/18  4098     History   Chief Complaint Chief Complaint  Patient presents with  . Fatigue    HPI Keith Lawrence is a 22 y.o. male.  Pt presents to the ED today because of chronic fatigue and abdominal pain.  The pt has a 2 year history of this and wants to know what is happening.  He does have a hx of hypothyroidism and has been taking his synthroid.  He has been seen by pcp and by endocrinologist who do not feel he needs to increase his synthroid as levels have been stable.  Pt did have some n/v yesterday.  None today.  He feels cold all the time.  He feels sob all the time.  He has quit smoking tobacco and MJ, and no longer drinks alcohol, but this has not helped his sx.     Past Medical History:  Diagnosis Date  . Abdominal pain    all over pain  . ADHD (attention deficit hyperactivity disorder)   . Fatigue   . Goiter   . Hypothyroidism, acquired, autoimmune   . Iron excess   . Isosexual precocity   . Nausea and vomiting   . Physical growth delay   . SOB (shortness of breath) on exertion   . Thyroiditis, autoimmune     Patient Active Problem List   Diagnosis Date Noted  . Hyperglycemia 06/13/2016  . Polyuria 06/13/2016  . Striae 05/18/2016  . Nausea with vomiting 02/02/2016  . Abdominal pain, epigastric 02/02/2016  . Thyroid activity decreased 01/31/2016  . Cannabinoid hyperemesis syndrome (HCC) 01/31/2016  . 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 Neck pain 12/03/2013  . Abdominal pain, bilateral lower quadrant 12/03/2013  . Parent-child conflict 02/23/2013  . Outbursts of anger 02/23/2013  . Social problem in school 08/25/2012  . Physical growth delay   . ADHD (attention deficit hyperactivity disorder)   . Goiter   . Hypothyroidism, acquired, autoimmune   . Thyroiditis, autoimmune   . Isosexual precocity   . Fatigue   . Iron excess   . Lack of expected normal  physiological development in childhood 02/28/2011  . Other specified acquired hypothyroidism 02/28/2011  . Goiter, unspecified 02/28/2011    Past Surgical History:  Procedure Laterality Date  . CHALAZION EXCISION    . FRENULECTOMY, LINGUAL    . TONSILLECTOMY AND ADENOIDECTOMY          Home Medications    Prior to Admission medications   Medication Sig Start Date End Date Taking? Authorizing Provider  SYNTHROID 25 MCG tablet Take 1 tablet (25 mcg total) daily before breakfast by mouth. 08/14/17  Yes Nelwyn Salisbury, MD  amoxicillin-clavulanate (AUGMENTIN) 875-125 MG tablet Take 1 tablet by mouth 2 (two) times daily. Patient not taking: Reported on 07/04/2017 07/01/17   Nelwyn Salisbury, MD  ondansetron (ZOFRAN ODT) 4 MG disintegrating tablet Take 1 tablet (4 mg total) by mouth every 8 (eight) hours as needed. 01/22/18   Jacalyn Lefevre, MD    Family History Family History  Problem Relation Age of Onset  . Thyroid disease Maternal Grandmother   . Heart disease Maternal Grandmother   . Thyroid disease Paternal Aunt   . Heart disease Paternal Grandfather   . Diabetes Neg Hx     Social History Social History   Tobacco Use  . Smoking status: Former Smoker    Packs/day: 0.00    Types:  Cigarettes    Start date: 01/11/2012  . Smokeless tobacco: Never Used  Substance Use Topics  . Alcohol use: No    Alcohol/week: 0.0 oz  . Drug use: Yes    Types: "Crack" cocaine, Other-see comments, Marijuana    Comment: Stopped 5 months ago     Allergies   Patient has no known allergies.   Review of Systems Review of Systems  Constitutional: Positive for fatigue.  Respiratory: Positive for shortness of breath.   Gastrointestinal: Positive for abdominal pain, nausea and vomiting.  All other systems reviewed and are negative.    Physical Exam Updated Vital Signs BP 96/61 (BP Location: Left Arm)   Pulse 89   Temp 98.5 F (36.9 C) (Oral)   Resp 18   Ht 5\' 4"  (1.626 m)   Wt 59.4 kg  (131 lb)   SpO2 99%   BMI 22.49 kg/m   Physical Exam  Constitutional: He is oriented to person, place, and time. He appears well-developed and well-nourished.  HENT:  Head: Normocephalic and atraumatic.  Right Ear: External ear normal.  Left Ear: External ear normal.  Nose: Nose normal.  Mouth/Throat: Oropharynx is clear and moist.  Eyes: Pupils are equal, round, and reactive to light. Conjunctivae and EOM are normal.  Neck: Normal range of motion. Neck supple.  Cardiovascular: Normal rate, regular rhythm, normal heart sounds and intact distal pulses.  Pulmonary/Chest: Effort normal and breath sounds normal.  Abdominal: Soft. Bowel sounds are normal.  Musculoskeletal: Normal range of motion.  Neurological: He is alert and oriented to person, place, and time.  Skin: Skin is warm. Capillary refill takes less than 2 seconds.  Psychiatric: He has a normal mood and affect. His behavior is normal. Judgment and thought content normal.  Nursing note and vitals reviewed.    ED Treatments / Results  Labs (all labs ordered are listed, but only abnormal results are displayed) Labs Reviewed  COMPREHENSIVE METABOLIC PANEL - Abnormal; Notable for the following components:      Result Value   Glucose, Bld 101 (*)    All other components within normal limits  URINALYSIS, ROUTINE W REFLEX MICROSCOPIC - Abnormal; Notable for the following components:   Ketones, ur 5 (*)    All other components within normal limits  CBC WITH DIFFERENTIAL/PLATELET  LIPASE, BLOOD  TSH    EKG None  Radiology Dg Chest 2 View  Result Date: 01/22/2018 CLINICAL DATA:  Cough, mid chest pain and fatigue for 2 years. EXAM: CHEST - 2 VIEW COMPARISON:  PA and lateral chest 08/26/2015. FINDINGS: Lungs are clear. Heart size is normal. No pneumothorax or pleural effusion. No bony abnormality. IMPRESSION: Normal chest. Electronically Signed   By: Drusilla Kannerhomas  Dalessio M.D.   On: 01/22/2018 11:59   Ct Abdomen Pelvis W  Contrast  Result Date: 01/22/2018 CLINICAL DATA:  22 year old male with acute abdominal and pelvic pain with vomiting for 1 day. EXAM: CT ABDOMEN AND PELVIS WITH CONTRAST TECHNIQUE: Multidetector CT imaging of the abdomen and pelvis was performed using the standard protocol following bolus administration of intravenous contrast. CONTRAST:  100 cc intravenous Isovue 300 COMPARISON:  01/31/2016 and prior CTs FINDINGS: Lower chest: No acute abnormality Hepatobiliary: The liver and gallbladder are unremarkable. No biliary dilatation. Pancreas: Unremarkable Spleen: Unremarkable Adrenals/Urinary Tract: The kidneys, adrenal glands and bladder are unremarkable. Stomach/Bowel: Stomach is within normal limits. Appendix appears normal. No evidence of bowel wall thickening, distention, or inflammatory changes. Vascular/Lymphatic: No significant vascular findings are present. No enlarged abdominal  or pelvic lymph nodes. Reproductive: Prostate is unremarkable. Other: No ascites, abscess or pneumoperitoneum. Musculoskeletal: No acute or suspicious abnormalities. Irregularity of the articular surface of the RIGHT femoral head appears chronic. Degenerative changes in the RIGHT hip noted. IMPRESSION: 1. No evidence of acute abnormality or identifiable cause for this patient's abdominal pain. 2. Chronic RIGHT femoral head changes with RIGHT hip degenerative changes. Electronically Signed   By: Harmon Pier M.D.   On: 01/22/2018 13:41    Procedures Procedures (including critical care time)  Medications Ordered in ED Medications  iopamidol (ISOVUE-300) 61 % injection (has no administration in time range)  sodium chloride 0.9 % injection (has no administration in time range)  sodium chloride 0.9 % bolus 1,000 mL (0 mLs Intravenous Stopped 01/22/18 1158)  iopamidol (ISOVUE-300) 61 % injection 100 mL (100 mLs Intravenous Contrast Given 01/22/18 1307)  acetaminophen (TYLENOL) tablet 1,000 mg (1,000 mg Oral Given 01/22/18 1323)      Initial Impression / Assessment and Plan / ED Course  I have reviewed the triage vital signs and the nursing notes.  Pertinent labs & imaging results that were available during my care of the patient were reviewed by me and considered in my medical decision making (see chart for details).    Pt's labs are nl.  CT and CXR ok.  Pt instructed to f/u with his pcp regarding further recommendations.  Final Clinical Impressions(s) / ED Diagnoses   Final diagnoses:  Fatigue, unspecified type  Abdominal pain, unspecified abdominal location  Non-intractable vomiting with nausea, unspecified vomiting type    ED Discharge Orders        Ordered    ondansetron (ZOFRAN ODT) 4 MG disintegrating tablet  Every 8 hours PRN     01/22/18 1346       Jacalyn Lefevre, MD 01/22/18 1403

## 2018-01-22 NOTE — ED Notes (Signed)
Patient transported to X-ray 

## 2018-01-22 NOTE — ED Triage Notes (Addendum)
Per [facility] pt coming from home, had argument with mother and called EMS. C/o feeling fatigued. States he takes synthroid for hypothyroidism but does not feel like it is working and has been dealing with this for 2 years. Reports 1 episode of emesis yesterday but tolerated food today.

## 2018-01-26 ENCOUNTER — Ambulatory Visit (INDEPENDENT_AMBULATORY_CARE_PROVIDER_SITE_OTHER): Payer: 59 | Admitting: Internal Medicine

## 2018-01-26 ENCOUNTER — Encounter: Payer: Self-pay | Admitting: Internal Medicine

## 2018-01-26 VITALS — BP 124/90 | HR 94 | Temp 98.4°F | Wt 143.9 lb

## 2018-01-26 DIAGNOSIS — R110 Nausea: Secondary | ICD-10-CM

## 2018-01-26 DIAGNOSIS — E063 Autoimmune thyroiditis: Secondary | ICD-10-CM

## 2018-01-26 DIAGNOSIS — F418 Other specified anxiety disorders: Secondary | ICD-10-CM

## 2018-01-26 DIAGNOSIS — R109 Unspecified abdominal pain: Secondary | ICD-10-CM | POA: Diagnosis not present

## 2018-01-26 DIAGNOSIS — G8929 Other chronic pain: Secondary | ICD-10-CM | POA: Diagnosis not present

## 2018-01-26 NOTE — Progress Notes (Signed)
Chief Complaint  Patient presents with  . GI Problem    Chronic stomach issues x 2 years. Seen in ED for 4/18 for chills, weakness and hunger. pt states that he is feels that he is not benefitting from anything he is eating, not sure if it is malnutrition and is also concerned with his increasing weakness. Pt has been drinking Ensure to see if this would help but nothing seems to help. Pt reports having blood in stool and vomit. Pt last vomited on 01/24/18 and he noticed blood and also noticed blood in his stools same day.. Chills and cold sweats    HPI: Keith Lawrence 21 y.o. come in for   SDA post Ed visit   PCP appt schedule is full  Today  Has been having problem  With nausea and stomach ever since  Seen canabinoid hyperemesis syundrome and  Stopped all rd since that time but still feels bad .used to use  Daily ? w/ wo etoh for  A few years?    See care in [place]   pcp moved and  Changed to [place] " Not sure what could be"   Concern about Gi bleed .  Shivering . And  .  "Feels like   Im dying " . Feels malnourished . Hungry and weak. Lives with mom .    Gyne cancer.   Too tired to mow th lawn  Feels weak but no real cp sob  Focal weakness  Tries to eat to feel better    6  mos ago was working at [employer] .  Stopped cause   of "no energy . " someday [place] diarrhea and doms black and some  Drops of blood   .  Last  Time felt  Ok was  2 years ago    Before stopping? etoh and weed. That took him to ed  And  Felt bad   Ever since    Using vit d vit b and  Fo to and ensure to get enough nutrition  But no vomiting but  Stomach is a problem   Strong fam hx of   Thyroid and had cortisol once  Checked   He had  Dx thyroid diseaes at age 24 years or therabouts .  ROS: See pertinent positives and negatives per HPI. Feels cold all the time  Shivering  ? t 97range?  No melena  Has some drop of blood stool at times .   Past Medical History:  Diagnosis Date  . Abdominal pain    all over pain   . ADHD (attention deficit hyperactivity disorder)   . Fatigue   . Goiter   . Hypothyroidism, acquired, autoimmune   . Iron excess   . Isosexual precocity   . Nausea and vomiting   . Physical growth delay   . SOB (shortness of breath) on exertion   . Thyroiditis, autoimmune     Family History  Problem Relation Age of Onset  . Thyroid disease Maternal Grandmother   . Heart disease Maternal Grandmother   . Thyroid disease Paternal Aunt   . Heart disease Paternal Grandfather   . Diabetes Neg Hx     Social History   Socioeconomic History  . Marital status: Single    Spouse name: Not on file  . Number of children: Not on file  . Years of education: Not on file  . Highest education level: Not on file  Occupational History  . Not on  file  Social Needs  . Financial resource strain: Not on file  . Food insecurity:    Worry: Not on file    Inability: Not on file  . Transportation needs:    Medical: Not on file    Non-medical: Not on file  Tobacco Use  . Smoking status: Former Smoker    Packs/day: 0.00    Types: Cigarettes    Start date: 01/11/2012  . Smokeless tobacco: Never Used  Substance and Sexual Activity  . Alcohol use: No    Alcohol/week: 0.0 oz  . Drug use: Yes    Types: "Crack" cocaine, Other-see comments, Marijuana    Comment: Stopped 5 months ago  . Sexual activity: Not on file  Lifestyle  . Physical activity:    Days per week: Not on file    Minutes per session: Not on file  . Stress: Not on file  Relationships  . Social connections:    Talks on phone: Not on file    Gets together: Not on file    Attends religious service: Not on file    Active member of club or organization: Not on file    Attends meetings of clubs or organizations: Not on file    Relationship status: Not on file  Other Topics Concern  . Not on file  Social History Narrative   Lives with foster parents. Mom still has guardianship.  [employer].     Outpatient  Medications Prior to Visit  Medication Sig Dispense Refill  . ondansetron (ZOFRAN ODT) 4 MG disintegrating tablet Take 1 tablet (4 mg total) by mouth every 8 (eight) hours as needed. 10 tablet 0  . SYNTHROID 25 MCG tablet Take 1 tablet (25 mcg total) daily before breakfast by mouth. 30 tablet 5  . amoxicillin-clavulanate (AUGMENTIN) 875-125 MG tablet Take 1 tablet by mouth 2 (two) times daily. (Patient not taking: Reported on 07/04/2017) 20 tablet 0   No facility-administered medications prior to visit.      EXAM:  BP 124/90 (BP Location: Right Arm, Patient Position: Sitting, Cuff Size: Normal)   Pulse 94   Temp 98.4 F (36.9 C) (Oral)   Wt 143 lb 14.4 oz (65.3 kg)   BMI 24.70 kg/m   Body mass index is 24.7 kg/m.  GENERAL: vitals reviewed and listed above, alert, oriented, appears well hydrated and in no acute distress looks well nourished   Some anxieous  HEENT: atraumatic, conjunctiva  clear, no obvious abnormalities on inspection of external nose and ears NECK: no obvious masses on inspection palpation  LUNGS: clear to auscultation bilaterally, no wheezes, rales or rhonchi, good air movement CV: HRRR, no clubbing cyanosis or  peripheral edema nl cap refill  Abdomen:  Sof,t normal bowel sounds without hepatosplenomegaly, no guarding rebound or masses no CVA tenderness  Sore gutter  r and left lq no g r or masses  MS: moves all extremities without noticeable focal  Abnormality area of old spider bit pignented not palpable right thigh and seems nl  PSYCH: pleasant and cooperative, c/o of feeling bad  And worry about malnourished but looks nourished , Lab Results  Component Value Date   WBC 6.2 01/22/2018   HGB 16.4 01/22/2018   HCT 45.8 01/22/2018   PLT 219 01/22/2018   GLUCOSE 101 (H) 01/22/2018   ALT 23 01/22/2018   AST 20 01/22/2018   NA 144 01/22/2018   K 4.0 01/22/2018   CL 106 01/22/2018   CREATININE 0.90 01/22/2018  BUN 12 01/22/2018   CO2 27 01/22/2018   TSH 2.165  01/22/2018   HGBA1C 5.0 06/13/2016   BP Readings from Last 3 Encounters:  01/26/18 124/90  01/22/18 (!) 107/56  07/04/17 100/70   Wt Readings from Last 3 Encounters:  01/26/18 143 lb 14.4 oz (65.3 kg)  01/22/18 131 lb (59.4 kg)  07/04/17 140 lb (63.5 kg)   IMPRESSION: 1. No evidence of acute abnormality or identifiable cause for this patient's abdominal pain. 2. Chronic RIGHT femoral head changes with RIGHT hip degenerative changes.   Electronically Signed   By: Harmon PierJeffrey  Hu M.D.   On: 01/22/2018 13:41  ASSESSMENT AND PLAN:  Discussed the following assessment and plan:  Chronic abdominal pain  Anxiety about health  Hypothyroidism, acquired, autoimmune  Chronic nausea Ed visit review  No specific dx noted    But  20 min reviewed or record  Has had disjointed pcp cause of transitions  But  Apparently has seen Gi at some point with neg endo had elevated iron level that was neg after  Off vits and neg  Gene testing .   Disc secondary anxiety causing some of the sx and fear of dying  From whatever condition .   consider doing more info about anxiety   Will plan discuss with dr Clent RidgesFry and have him make appt with him  In fu  Ok to use pepcid not near the thyroid   Medication  interesteing that he dates much of this back to the  Dx of hyperemesis MJ syndrome    . At this time denies   Keith Lawrence rd use  For 2 years?   Make sure no hypopit causing sx  Since has  Childhood autoimmunie process   Ct abd nl x arthritis r hip?  ? If gb scan ever done  Or indicated   ? If ever had celiac screen with hx of thyroid disease , consideration of  Lactose intolerance  Etc   Hx of rpr pos  ? rx ed .   -Patient advised to return or notify health care team  if  new concerns arise.  Patient Instructions  I would stop all the supplements for now in case making worse instead of better .  Would rather you take   in foods that have the    Vitamins in it    No french fries .    Limit processed carbs   For now.  I will have dr Clent RidgesFry and us review  Your record about  Next step to help.  I think anxiety is on top of the original    Problem .  Could get help for this.    Make appt   With dr fry for 2 weeks from now.         Neta MendsWanda K. Ezma Rehm M.D.

## 2018-01-26 NOTE — Patient Instructions (Addendum)
I would stop all the supplements for now in case making worse instead of better .  Would rather you take   in foods that have the    Vitamins in it    No french fries .    Limit processed carbs  For now.  I will have dr Clent RidgesFry and us review  Your record about  Next step to help.  I think anxiety is on top of the original    Problem .  Could get help for this.    Make appt   With dr fry for 2 weeks from now.

## 2018-01-27 DIAGNOSIS — Z87891 Personal history of nicotine dependence: Secondary | ICD-10-CM | POA: Insufficient documentation

## 2018-01-27 DIAGNOSIS — Z79899 Other long term (current) drug therapy: Secondary | ICD-10-CM | POA: Diagnosis not present

## 2018-01-27 DIAGNOSIS — E860 Dehydration: Secondary | ICD-10-CM | POA: Insufficient documentation

## 2018-01-27 DIAGNOSIS — R110 Nausea: Secondary | ICD-10-CM | POA: Insufficient documentation

## 2018-01-27 DIAGNOSIS — E031 Congenital hypothyroidism without goiter: Secondary | ICD-10-CM | POA: Diagnosis not present

## 2018-01-27 DIAGNOSIS — R112 Nausea with vomiting, unspecified: Secondary | ICD-10-CM | POA: Diagnosis not present

## 2018-01-27 DIAGNOSIS — R531 Weakness: Secondary | ICD-10-CM | POA: Insufficient documentation

## 2018-01-27 DIAGNOSIS — R1013 Epigastric pain: Secondary | ICD-10-CM | POA: Insufficient documentation

## 2018-01-28 ENCOUNTER — Encounter (HOSPITAL_COMMUNITY): Payer: Self-pay | Admitting: Emergency Medicine

## 2018-01-28 ENCOUNTER — Other Ambulatory Visit: Payer: Self-pay

## 2018-01-28 ENCOUNTER — Emergency Department (HOSPITAL_COMMUNITY)
Admission: EM | Admit: 2018-01-28 | Discharge: 2018-01-28 | Disposition: A | Discharge status: Home / Self Care | Payer: 59 | Attending: Emergency Medicine | Admitting: Emergency Medicine

## 2018-01-28 DIAGNOSIS — E031 Congenital hypothyroidism without goiter: Secondary | ICD-10-CM | POA: Diagnosis not present

## 2018-01-28 DIAGNOSIS — R110 Nausea: Secondary | ICD-10-CM

## 2018-01-28 DIAGNOSIS — E860 Dehydration: Secondary | ICD-10-CM | POA: Diagnosis not present

## 2018-01-28 DIAGNOSIS — R1013 Epigastric pain: Secondary | ICD-10-CM

## 2018-01-28 DIAGNOSIS — Z79899 Other long term (current) drug therapy: Secondary | ICD-10-CM | POA: Diagnosis not present

## 2018-01-28 DIAGNOSIS — R531 Weakness: Secondary | ICD-10-CM

## 2018-01-28 DIAGNOSIS — Z87891 Personal history of nicotine dependence: Secondary | ICD-10-CM | POA: Diagnosis not present

## 2018-01-28 LAB — CBC WITH DIFFERENTIAL/PLATELET
Basophils Absolute: 0 10*3/uL (ref 0.0–0.1)
Basophils Relative: 0 %
EOS ABS: 0.1 10*3/uL (ref 0.0–0.7)
EOS PCT: 1 %
HCT: 43.6 % (ref 39.0–52.0)
HEMOGLOBIN: 15.5 g/dL (ref 13.0–17.0)
LYMPHS ABS: 1.8 10*3/uL (ref 0.7–4.0)
LYMPHS PCT: 25 %
MCH: 30.9 pg (ref 26.0–34.0)
MCHC: 35.6 g/dL (ref 30.0–36.0)
MCV: 86.9 fL (ref 78.0–100.0)
MONOS PCT: 10 %
Monocytes Absolute: 0.7 10*3/uL (ref 0.1–1.0)
NEUTROS PCT: 64 %
Neutro Abs: 4.5 10*3/uL (ref 1.7–7.7)
Platelets: 237 10*3/uL (ref 150–400)
RBC: 5.02 MIL/uL (ref 4.22–5.81)
RDW: 12.7 % (ref 11.5–15.5)
WBC: 7.1 10*3/uL (ref 4.0–10.5)

## 2018-01-28 LAB — RAPID URINE DRUG SCREEN, HOSP PERFORMED
Amphetamines: NOT DETECTED
Barbiturates: NOT DETECTED
Benzodiazepines: NOT DETECTED
Cocaine: NOT DETECTED
OPIATES: NOT DETECTED
Tetrahydrocannabinol: NOT DETECTED

## 2018-01-28 LAB — CK: CK TOTAL: 182 U/L (ref 49–397)

## 2018-01-28 LAB — COMPREHENSIVE METABOLIC PANEL
ALK PHOS: 41 U/L (ref 38–126)
ALT: 22 U/L (ref 17–63)
ANION GAP: 11 (ref 5–15)
AST: 19 U/L (ref 15–41)
Albumin: 4.4 g/dL (ref 3.5–5.0)
BUN: 22 mg/dL — ABNORMAL HIGH (ref 6–20)
CALCIUM: 9.6 mg/dL (ref 8.9–10.3)
CO2: 23 mmol/L (ref 22–32)
CREATININE: 0.79 mg/dL (ref 0.61–1.24)
Chloride: 104 mmol/L (ref 101–111)
GFR calc Af Amer: >60 mL/min (ref >60)
Glucose, Bld: 90 mg/dL (ref 65–99)
Potassium: 4.2 mmol/L (ref 3.5–5.1)
SODIUM: 138 mmol/L (ref 135–145)
TOTAL PROTEIN: 7.1 g/dL (ref 6.5–8.1)
Total Bilirubin: 0.5 mg/dL (ref 0.3–1.2)

## 2018-01-28 LAB — URINALYSIS, ROUTINE W REFLEX MICROSCOPIC
Bilirubin Urine: NEGATIVE
Glucose, UA: NEGATIVE mg/dL
Hgb urine dipstick: NEGATIVE
Ketones, ur: NEGATIVE mg/dL
LEUKOCYTES UA: NEGATIVE
NITRITE: NEGATIVE
PROTEIN: NEGATIVE mg/dL
SPECIFIC GRAVITY, URINE: 1.02 (ref 1.005–1.030)
pH: 6 (ref 5.0–8.0)

## 2018-01-28 LAB — LIPASE, BLOOD: LIPASE: 24 U/L (ref 11–51)

## 2018-01-28 LAB — I-STAT CG4 LACTIC ACID, ED: LACTIC ACID, VENOUS: 0.87 mmol/L (ref 0.5–1.9)

## 2018-01-28 MED ORDER — FAMOTIDINE 20 MG PO TABS: 20.0000 mg | ORAL_TABLET | Freq: Two times a day (BID) | ORAL | 0 refills | Status: DC

## 2018-01-28 MED ORDER — SODIUM CHLORIDE 0.9 % IV BOLUS
1000.0000 mL | Freq: Once | INTRAVENOUS | Status: AC
  Administered 2018-01-28: 1000 mL via INTRAVENOUS

## 2018-01-28 MED ORDER — GI COCKTAIL ~~LOC~~
30.0000 mL | Freq: Once | ORAL | Status: AC
Start: 2018-01-28 — End: 2018-01-28
  Administered 2018-01-28: 30 mL via ORAL
  Filled 2018-01-28: qty 30

## 2018-01-28 MED ORDER — METOCLOPRAMIDE HCL 5 MG/ML IJ SOLN
10.0000 mg | Freq: Once | INTRAMUSCULAR | Status: AC
  Administered 2018-01-28: 10 mg via INTRAVENOUS
  Filled 2018-01-28: qty 2

## 2018-01-28 MED ORDER — FAMOTIDINE IN NACL 20-0.9 MG/50ML-% IV SOLN
20.0000 mg | Freq: Once | INTRAVENOUS | Status: AC
  Administered 2018-01-28: 20 mg via INTRAVENOUS
  Filled 2018-01-28: qty 50

## 2018-01-28 NOTE — Discharge Instructions (Signed)
Avoid aspirin or ibuprofen or aleve type products.  Drink plenty of fluids.  Avoid eating spicy or acidic foods

## 2018-01-28 NOTE — ED Provider Notes (Signed)
[facility]-EMERGENCY DEPT Provider Note   CSN: [id number] Arrival date & time: 01/27/18  2206     History   Chief Complaint Chief Complaint  Patient presents with  . Weakness    HPI Keith Lawrence is a 22 y.o. male.  The history is provided by the patient.  Weakness  Primary symptoms comment: generalized weakness. This is a chronic problem. Episode onset: 1.5 year. The problem has been gradually worsening. There was no focality noted. There has been no fever. Associated symptoms include vomiting. Associated symptoms comments: Intermittent loose stool but no diarrhea in the last 2 days.  Intermittent streaks of blood in vomitus and stool but nothing in the last 24 hours.  LLQ pain which is chronic and no worse today.  Constant nausea.  20lb weight loss.  No urinary symptoms.  No ETOH, marijuana or other drug use in a year.  Headaches intermittently and generalized weakness which is worsening.  Pt states today he did not even feel that he could walk down the stairs and called 911.  Pt is taking his synthroid and had TSH checked recently and was normal.  Recently seen in the ED for similar and normal labs and d/ced home.  Pt planning on f/u with PCP Dr. Clent Ridges and also referral in for GI but states felt so bad today just wanted to feel better.  Also complaining of chronic pain in the bilateral shoulders, arms, back and left lower quadrant.. Meds prior to arrival: Only medication he is taking at home with Synthroid. Associated medical issues comments: Autoimmune hypothyroidism.    Past Medical History:  Diagnosis Date  . Abdominal pain    all over pain  . ADHD (attention deficit hyperactivity disorder)   . Fatigue   . Goiter   . Hypothyroidism, acquired, autoimmune   . Iron excess   . Isosexual precocity   . Nausea and vomiting   . Physical growth delay   . SOB (shortness of breath) on exertion   . Thyroiditis, autoimmune     Patient Active Problem List   Diagnosis Date Noted  . Hyperglycemia 06/13/2016  . Polyuria 06/13/2016  . Striae 05/18/2016  . Nausea with vomiting 02/02/2016  . Abdominal pain, epigastric 02/02/2016  . Thyroid activity decreased 01/31/2016  . Cannabinoid hyperemesis syndrome (HCC) 01/31/2016  . 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 Neck pain 12/03/2013  . Abdominal pain, bilateral lower quadrant 12/03/2013  . Parent-child conflict 02/23/2013  . Outbursts of anger 02/23/2013  . Social problem in school 08/25/2012  . Physical growth delay   . ADHD (attention deficit hyperactivity disorder)   . Goiter   . Hypothyroidism, acquired, autoimmune   . Thyroiditis, autoimmune   . Isosexual precocity   . Fatigue   . Iron excess   . Lack of expected normal physiological development in childhood 02/28/2011  . Other specified acquired hypothyroidism 02/28/2011  . Goiter, unspecified 02/28/2011    Past Surgical History:  Procedure Laterality Date  . CHALAZION EXCISION    . FRENULECTOMY, LINGUAL    . TONSILLECTOMY AND ADENOIDECTOMY          Home Medications    Prior to Admission medications   Medication Sig Start Date End Date Taking? Authorizing Provider  SYNTHROID 25 MCG tablet Take 1 tablet (25 mcg total) daily before breakfast by mouth. 08/14/17  Yes Nelwyn Salisbury, MD  ondansetron (ZOFRAN ODT) 4 MG disintegrating tablet Take 1 tablet (4 mg total) by mouth every 8 (eight) hours as  needed. Patient not taking: Reported on 01/28/2018 01/22/18   Jacalyn Lefevre, MD    Family History Family History  Problem Relation Age of Onset  . Thyroid disease Maternal Grandmother   . Heart disease Maternal Grandmother   . Thyroid disease Paternal Aunt   . Heart disease Paternal Grandfather   . Diabetes Neg Hx     Social History Social History   Tobacco Use  . Smoking status: Former Smoker    Packs/day: 0.00    Types: Cigarettes    Start date: 01/11/2012  . Smokeless tobacco: Never Used  Substance Use Topics  . Alcohol use:  No    Alcohol/week: 0.0 oz  . Drug use: Yes    Types: "Crack" cocaine, Other-see comments, Marijuana    Comment: Stopped 5 months ago     Allergies   Patient has no known allergies.   Review of Systems Review of Systems  Gastrointestinal: Positive for vomiting.  Endocrine: Positive for cold intolerance.  Neurological: Positive for weakness.       Chronic fatigue  All other systems reviewed and are negative.    Physical Exam Updated Vital Signs BP 115/64 (BP Location: Left Arm)   Pulse 96   Temp 98.6 F (37 C) (Oral)   Resp 14   Ht 5\' 4"  (1.626 m)   Wt 65.3 kg (144 lb)   SpO2 96%   BMI 24.72 kg/m   Physical Exam  Constitutional: He is oriented to person, place, and time. He appears well-developed and well-nourished. No distress.  HENT:  Head: Normocephalic and atraumatic.  Mouth/Throat: Oropharynx is clear and moist.  Eyes: Pupils are equal, round, and reactive to light. Conjunctivae and EOM are normal.  Neck: Normal range of motion. Neck supple.  Cardiovascular: Normal rate, regular rhythm and intact distal pulses.  No murmur heard. Pulmonary/Chest: Effort normal and breath sounds normal. No respiratory distress. He has no wheezes. He has no rales.  Abdominal: Soft. He exhibits no distension. There is tenderness in the left lower quadrant. There is no rebound and no guarding.  Mild left lower quadrant tenderness without rebound or guarding  Musculoskeletal: Normal range of motion. He exhibits tenderness. He exhibits no edema.  Pain with palpation over the bilateral shoulder and scapular area.  No joint swelling or muscle wasting.    Neurological: He is alert and oriented to person, place, and time.  Normal sensation and 5/5 strength of bilateral upper and lower ext.  No babinski or clonus  Skin: Skin is warm and dry. No rash noted. No erythema.  Psychiatric: His speech is normal and behavior is normal. His affect is blunt.  Nursing note and vitals  reviewed.    ED Treatments / Results  Labs (all labs ordered are listed, but only abnormal results are displayed) Labs Reviewed  COMPREHENSIVE METABOLIC PANEL - Abnormal; Notable for the following components:      Result Value   BUN 22 (*)    All other components within normal limits  CBC WITH DIFFERENTIAL/PLATELET  CK  LIPASE, BLOOD  RAPID URINE DRUG SCREEN, HOSP PERFORMED  URINALYSIS, ROUTINE W REFLEX MICROSCOPIC  I-STAT CG4 LACTIC ACID, ED    EKG None  Radiology No results found.  Procedures Procedures (including critical care time)  Medications Ordered in ED Medications  gi cocktail (Maalox,Lidocaine,Donnatal) (has no administration in time range)  famotidine (PEPCID) IVPB 20 mg premix (has no administration in time range)  metoCLOPramide (REGLAN) injection 10 mg (10 mg Intravenous Given 01/28/18 0757)  sodium chloride 0.9 % bolus 1,000 mL (1,000 mLs Intravenous New Bag/Given 01/28/18 0757)     Initial Impression / Assessment and Plan / ED Course  I have reviewed the triage vital signs and the nursing notes.  Pertinent labs & imaging results that were available during my care of the patient were reviewed by me and considered in my medical decision making (see chart for details).     Patient presenting today with ongoing symptoms for the last year and a half of multiple vague complaints including intermittent vomiting, intermittent loose stools, ongoing pain in the bilateral shoulders back and left lower quadrant and constant nausea.  He complains of weight loss, cold intolerance and chronic fatigue.  He has not been eating or drinking much because of the constant nausea.  He is not taking anything at home for nausea only Synthroid for thyroid disease.  He has recently had his thyroid tested and medication seems to be appropriate.  Patient was seen on 01/22/2018 with similar symptoms at that time had normal labs and was given fluids and Zofran.  Patient does have  follow-up with his PCP and a referral is in for GI.  On exam patient has no localized abdominal findings concerning for appendicitis, pancreatitis, hepatitis or diverticulitis.  He has some minimal tenderness with palpation but no rebound or guarding.  He denies any urinary symptoms.  He has no evidence of muscle wasting, clonus or upper motor nerve abnormality.  Low suspicion for Inez CatalinaGillian Barr, transverse myelitis or infectious process.  With patient's intermittent blood in stool and ongoing nausea vomiting and frequent loose stools feel that he needs further GI work-up.  Also patient has autoimmune hypothyroidism and delayed physical growth which may indicate he has some type of endocrine abnormality and has been scheduled to see an endocrinologist as well. Patient is in no acute distress on exam here.  Vital signs are reassuring.  We will give patient IV fluids and nausea control.  CBC, CMP, lactate, lipase, CK, UA and UDS pending.  However feel that patient will be stable from discharge from the emergency room and will need further specialist work-up as an outpatient.  9:28 AM Patient's labs are reassuring.  On reexam after Reglan and IV fluids patient states the nausea still present however with further discussion it seems that his nausea improves after eating.  He also has epigastric discomfort and could have gastritis or the beginning of peptic ulcer disease.  Patient has never tried a PPI.  Will do GI cocktail and Pepcid here and have him try a 2-week course of H2 blocker  Final Clinical Impressions(s) / ED Diagnoses   Final diagnoses:  Generalized weakness  Nausea  Epigastric pain  Dehydration    ED Discharge Orders        Ordered    famotidine (PEPCID) 20 MG tablet  2 times daily     01/28/18 1038       Gwyneth SproutPlunkett, Donesha Wallander, MD 01/28/18 1040

## 2018-01-28 NOTE — ED Triage Notes (Signed)
Pt brought in by EMS from home with multiple complaints including cold sweats, generalized weakness, diarrhea that occasionally has blood in it, vomiting, headache, cold sweats, and difficulty sleeping

## 2018-01-28 NOTE — ED Notes (Signed)
Bed: [id number] Expected date:  Expected time:  Means of arrival:  Comments: For hall C

## 2018-02-09 ENCOUNTER — Ambulatory Visit: Payer: 59 | Admitting: Family Medicine

## 2018-02-09 DIAGNOSIS — Z0289 Encounter for other administrative examinations: Secondary | ICD-10-CM

## 2018-02-10 ENCOUNTER — Encounter: Payer: Self-pay | Admitting: Family Medicine

## 2018-02-10 ENCOUNTER — Ambulatory Visit (INDEPENDENT_AMBULATORY_CARE_PROVIDER_SITE_OTHER): Payer: 59 | Admitting: Family Medicine

## 2018-02-10 VITALS — BP 138/82 | HR 95 | Temp 98.6°F | Ht 64.0 in | Wt 144.6 lb

## 2018-02-10 DIAGNOSIS — R1032 Left lower quadrant pain: Secondary | ICD-10-CM | POA: Diagnosis not present

## 2018-02-10 DIAGNOSIS — R5383 Other fatigue: Secondary | ICD-10-CM

## 2018-02-10 DIAGNOSIS — R1031 Right lower quadrant pain: Secondary | ICD-10-CM

## 2018-02-10 DIAGNOSIS — K920 Hematemesis: Secondary | ICD-10-CM | POA: Diagnosis not present

## 2018-02-10 NOTE — Progress Notes (Signed)
   Subjective:    Patient ID: Keith Lawrence, male    DOB: [birth year], 22 y.o.   MRN: [id number]  HPI Here to follow up on chronic abdominal pain, nausea with occasional vomiting, diarrhea, and seeing blood in the vomitus and the stool. No fevers. His weight has been stable for the past 2 years. He had a normal upper endoscopy in 2017. He has tried Pepcid and Nexium with no improvement. He was in the ER on 01-22-18 and again on 01-28-18 for weakness, but all workup including labs were normal. His Hgb was 15.5.    Review of Systems  Constitutional: Positive for fatigue. Negative for chills, diaphoresis and fever.  Respiratory: Negative.   Cardiovascular: Negative.   Gastrointestinal: Positive for abdominal pain, blood in stool, diarrhea, nausea and vomiting. Negative for abdominal distention, anal bleeding and constipation.  Genitourinary: Negative.        Objective:   Physical Exam  Constitutional: He appears well-developed and well-nourished. No distress.  Cardiovascular: Normal rate, regular rhythm, normal heart sounds and intact distal pulses.  Pulmonary/Chest: Effort normal and breath sounds normal.  Abdominal: Soft. Bowel sounds are normal. He exhibits no distension and no mass. There is no rebound and no guarding. No hernia.  Mildly tender in the entire lower abdomen           Assessment & Plan:  Abdominal pain with nausea, and blood in vomitus and stool. Etiology is unclear. I suggested he try Omeprazole 20 mg daily. We will refer him back to GI to evaluate.  Gershon Crane, MD

## 2018-02-19 ENCOUNTER — Telehealth: Payer: Self-pay | Admitting: Family Medicine

## 2018-02-19 NOTE — Telephone Encounter (Unsigned)
Copied from CRM [id number]. Topic: Quick Communication - See Telephone Encounter >> Feb 19, 2018  5:12 PM Keith Lawrence wrote: Pt is asking for Amoxicillin  875 mg - for Ear infections.  Please call pt back to let him know if Dr. Clent Ridges can call this in for him.

## 2018-02-20 ENCOUNTER — Ambulatory Visit: Payer: 59 | Admitting: Family Medicine

## 2018-02-20 NOTE — Telephone Encounter (Signed)
Per Dr. Clent Ridges, pt will need a office visit to evaluate. I called and left a voice message for pt to call and schedule appointment, we do openings today here in the office.

## 2018-02-26 ENCOUNTER — Ambulatory Visit: Payer: Self-pay | Admitting: *Deleted

## 2018-02-26 NOTE — Telephone Encounter (Signed)
Pt has symptoms of thirst  weakness anxious feelings seen recently for similar symptoms has an appointment with Gi next week . No availability with Dr Clent Ridges till next week . Appointment made with Dr Caryl Never for tomorrow  Reason for Disposition . [1] Fatigue (i.e., tires easily, decreased energy) AND [2] persists > 1 week  Answer Assessment - Initial Assessment Questions 1. DESCRIPTION: "Describe how you are feeling."       Weak thirsty no appetite feels like pins and needles  In body   2. SEVERITY: "How bad is it?"  "Can you stand and walk?"   - MILD - Feels weak or tired, but does not interfere with work, school or normal activities   - MODERATE - Able to stand and walk; weakness interferes with work, school, or normal activities   - SEVERE - Unable to stand or walk       Moderate   3. ONSET:  "When did the weakness begin?"      2 years getting worse  4. CAUSE: "What do you think is causing the weakness?"     Pt  Thinks may be getting dehydrated  5. MEDICINES: "Have you recently started a new medicine or had a change in the amount of a medicine?"      Vit K 100 mcg daily   6. OTHER SYMPTOMS: "Do you have any other symptoms?" (e.g., chest pain, fever, cough, SOB, vomiting, diarrhea, bleeding)        abd pain all the time  Intermittant diarrhea   7. PREGNANCY: "Is there any chance you are pregnant?" "When was your last menstrual period?"      n/a  Protocols used: WEAKNESS (GENERALIZED) AND FATIGUE-A-AH

## 2018-02-26 NOTE — Telephone Encounter (Signed)
Duplicate triage encounter.  

## 2018-02-27 ENCOUNTER — Encounter (HOSPITAL_COMMUNITY): Payer: Self-pay | Admitting: Emergency Medicine

## 2018-02-27 ENCOUNTER — Ambulatory Visit: Payer: 59 | Admitting: Family Medicine

## 2018-02-27 ENCOUNTER — Emergency Department (HOSPITAL_COMMUNITY)
Admission: EM | Admit: 2018-02-27 | Discharge: 2018-02-27 | Disposition: A | Discharge status: Home / Self Care | Payer: 59 | Attending: Emergency Medicine | Admitting: Emergency Medicine

## 2018-02-27 DIAGNOSIS — R5383 Other fatigue: Secondary | ICD-10-CM | POA: Insufficient documentation

## 2018-02-27 DIAGNOSIS — Z87891 Personal history of nicotine dependence: Secondary | ICD-10-CM | POA: Diagnosis not present

## 2018-02-27 DIAGNOSIS — Z79899 Other long term (current) drug therapy: Secondary | ICD-10-CM | POA: Insufficient documentation

## 2018-02-27 DIAGNOSIS — R110 Nausea: Secondary | ICD-10-CM | POA: Diagnosis not present

## 2018-02-27 DIAGNOSIS — R531 Weakness: Secondary | ICD-10-CM | POA: Diagnosis not present

## 2018-02-27 DIAGNOSIS — R197 Diarrhea, unspecified: Secondary | ICD-10-CM | POA: Diagnosis present

## 2018-02-27 DIAGNOSIS — K529 Noninfective gastroenteritis and colitis, unspecified: Secondary | ICD-10-CM | POA: Insufficient documentation

## 2018-02-27 DIAGNOSIS — R404 Transient alteration of awareness: Secondary | ICD-10-CM | POA: Diagnosis not present

## 2018-02-27 LAB — CBC
HEMATOCRIT: 43.9 % (ref 39.0–52.0)
Hemoglobin: 15.6 g/dL (ref 13.0–17.0)
MCH: 31.3 pg (ref 26.0–34.0)
MCHC: 35.5 g/dL (ref 30.0–36.0)
MCV: 88.2 fL (ref 78.0–100.0)
Platelets: 207 10*3/uL (ref 150–400)
RBC: 4.98 MIL/uL (ref 4.22–5.81)
RDW: 12.3 % (ref 11.5–15.5)
WBC: 5.9 10*3/uL (ref 4.0–10.5)

## 2018-02-27 LAB — COMPREHENSIVE METABOLIC PANEL
ALT: 27 U/L (ref 17–63)
AST: 23 U/L (ref 15–41)
Albumin: 4.7 g/dL (ref 3.5–5.0)
Alkaline Phosphatase: 45 U/L (ref 38–126)
Anion gap: 11 (ref 5–15)
BUN: 14 mg/dL (ref 6–20)
CALCIUM: 9.7 mg/dL (ref 8.9–10.3)
CO2: 26 mmol/L (ref 22–32)
CREATININE: 0.95 mg/dL (ref 0.61–1.24)
Chloride: 102 mmol/L (ref 101–111)
GFR calc non Af Amer: >60 mL/min (ref >60)
Glucose, Bld: 93 mg/dL (ref 65–99)
Potassium: 3.9 mmol/L (ref 3.5–5.1)
SODIUM: 139 mmol/L (ref 135–145)
Total Bilirubin: 0.7 mg/dL (ref 0.3–1.2)
Total Protein: 7.7 g/dL (ref 6.5–8.1)

## 2018-02-27 LAB — URINALYSIS, ROUTINE W REFLEX MICROSCOPIC
BILIRUBIN URINE: NEGATIVE
Glucose, UA: NEGATIVE mg/dL
HGB URINE DIPSTICK: NEGATIVE
KETONES UR: NEGATIVE mg/dL
Leukocytes, UA: NEGATIVE
NITRITE: NEGATIVE
PH: 6 (ref 5.0–8.0)
Protein, ur: NEGATIVE mg/dL
SPECIFIC GRAVITY, URINE: 1.015 (ref 1.005–1.030)

## 2018-02-27 LAB — LIPASE, BLOOD: Lipase: 21 U/L (ref 11–51)

## 2018-02-27 MED ORDER — SODIUM CHLORIDE 0.9 % IV BOLUS
1000.0000 mL | Freq: Once | INTRAVENOUS | Status: AC
  Administered 2018-02-27: 1000 mL via INTRAVENOUS

## 2018-02-27 NOTE — Discharge Instructions (Addendum)
Try to ensure you are eating frequently and healthy.  Make sure you are staying well-hydrated with water. It is important that you follow-up with the GI doctor for further evaluation of your symptoms. Return to the emergency room if you develop fevers, severe abdominal pain, or any new or concerning symptoms.

## 2018-02-27 NOTE — ED Triage Notes (Signed)
Per [facility] pt from home for n/v/d for over year. Reports loss of appetite for month and fatigue. Vitals 123/78, 82HR, 99% on room air, 91 CBG

## 2018-02-27 NOTE — ED Provider Notes (Signed)
[facility]-EMERGENCY DEPT Provider Note   CSN: [id number] Arrival date & time: 02/27/18  1514     History   Chief Complaint Chief Complaint  Patient presents with  . Diarrhea  . Emesis  . Fatigue    HPI Keith Lawrence is a 22 y.o. male presenting for evaluation of continued diarrhea and tiredness.  Patient states for the past 2 years, he has had diarrhea and fatigue.  He has been evaluated multiple times in the ED and by his PCP.  He has appointment with GI set up for next week.  He reports nothing has changed today, but has also not improved.  A nurse that he talked to said that maybe he was dehydrated, so he decided to come to the ER again.  He reports he is having 2-3 bowel movements a day.  He reports intermittent nausea, decreased appetite, and no vomiting.  He denies fevers, chills, chest pain, shortness of breath, abdominal pain, abnormal urination.  Nothing makes his symptoms better or worse.  He has a history of hypothyroidism for which he is currently taking iodine since he cannot take his Synthroid.  His TSH has been checked multiple times by his PCP since his symptoms began 2 years ago, and has always been normal.  No other medical problems.  He is not taking any other medications daily.  HPI  Past Medical History:  Diagnosis Date  . Abdominal pain    all over pain  . ADHD (attention deficit hyperactivity disorder)   . Fatigue   . Goiter   . Hypothyroidism, acquired, autoimmune   . Iron excess   . Isosexual precocity   . Nausea and vomiting   . Physical growth delay   . SOB (shortness of breath) on exertion   . Thyroiditis, autoimmune     Patient Active Problem List   Diagnosis Date Noted  . Hyperglycemia 06/13/2016  . Polyuria 06/13/2016  . Striae 05/18/2016  . Nausea with vomiting 02/02/2016  . Abdominal pain, epigastric 02/02/2016  . Thyroid activity decreased 01/31/2016  . Cannabinoid hyperemesis syndrome (HCC) 01/31/2016    . 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 Neck pain 12/03/2013  . Abdominal pain, bilateral lower quadrant 12/03/2013  . Parent-child conflict 02/23/2013  . Outbursts of anger 02/23/2013  . Social problem in school 08/25/2012  . Physical growth delay   . ADHD (attention deficit hyperactivity disorder)   . Goiter   . Hypothyroidism, acquired, autoimmune   . Thyroiditis, autoimmune   . Isosexual precocity   . Fatigue   . Iron excess   . Lack of expected normal physiological development in childhood 02/28/2011  . Other specified acquired hypothyroidism 02/28/2011  . Goiter, unspecified 02/28/2011    Past Surgical History:  Procedure Laterality Date  . CHALAZION EXCISION    . ESOPHAGOGASTRODUODENOSCOPY  03/19/2016   per Dr. Myrtie Neither, normal   . FRENULECTOMY, LINGUAL    . TONSILLECTOMY AND ADENOIDECTOMY          Home Medications    Prior to Admission medications   Medication Sig Start Date End Date Taking? Authorizing Provider  famotidine (PEPCID) 10 MG tablet Take 10 mg by mouth 2 (two) times daily.   Yes [provider]  SYNTHROID 25 MCG tablet Take 1 tablet (25 mcg total) daily before breakfast by mouth. 08/14/17  Yes Nelwyn Salisbury, MD  ondansetron (ZOFRAN ODT) 4 MG disintegrating tablet Take 1 tablet (4 mg total) by mouth every 8 (eight) hours as needed. Patient  not taking: Reported on 02/27/2018 01/22/18   Jacalyn Lefevre, MD    Family History Family History  Problem Relation Age of Onset  . Thyroid disease Maternal Grandmother   . Heart disease Maternal Grandmother   . Thyroid disease Paternal Aunt   . Heart disease Paternal Grandfather   . Diabetes Neg Hx     Social History Social History   Tobacco Use  . Smoking status: Former Smoker    Packs/day: 0.00    Types: Cigarettes    Start date: 01/11/2012  . Smokeless tobacco: Never Used  Substance Use Topics  . Alcohol use: No    Alcohol/week: 0.0 oz  . Drug use: Yes    Types: "Crack" cocaine, Other-see comments,  Marijuana    Comment: Stopped 5 months ago     Allergies   Patient has no known allergies.   Review of Systems Review of Systems  Constitutional: Positive for fatigue.  Gastrointestinal: Positive for diarrhea and nausea.  All other systems reviewed and are negative.    Physical Exam Updated Vital Signs BP 110/71 (BP Location: Right Arm)   Pulse 75   Temp 98.1 F (36.7 C) (Oral)   Resp 14   Ht  (1.626 m)   Wt 63.5 kg (140 lb)   SpO2 100%   BMI 24.03 kg/m   Physical Exam  Constitutional: He is oriented to person, place, and time. He appears well-developed and well-nourished. No distress.  Resting comfortably in no apparent distress.  HENT:  Head: Normocephalic and atraumatic.  Eyes: Pupils are equal, round, and reactive to light. Conjunctivae and EOM are normal.  Neck: Normal range of motion. Neck supple.  Cardiovascular: Normal rate, regular rhythm and intact distal pulses.  Pulmonary/Chest: Effort normal and breath sounds normal. No respiratory distress. He has no wheezes.  Abdominal: Soft. Bowel sounds are normal. He exhibits no distension and no mass. There is no tenderness. There is no rebound and no guarding.  Abdomen soft without rigidity, guarding, or distention.  No tenderness to palpation.  Bowel sounds normoactive.  Musculoskeletal: Normal range of motion.  Neurological: He is alert and oriented to person, place, and time.  Skin: Skin is warm and dry.  Psychiatric: He has a normal mood and affect.  Nursing note and vitals reviewed.    ED Treatments / Results  Labs (all labs ordered are listed, but only abnormal results are displayed) Labs Reviewed  LIPASE, BLOOD  COMPREHENSIVE METABOLIC PANEL  CBC  URINALYSIS, ROUTINE W REFLEX MICROSCOPIC    EKG None  Radiology No results found.  Procedures Procedures (including critical care time)  Medications Ordered in ED Medications  sodium chloride 0.9 % bolus 1,000 mL (0 mLs Intravenous Stopped  02/27/18 1823)     Initial Impression / Assessment and Plan / ED Course  I have reviewed the triage vital signs and the nursing notes.  Pertinent labs & imaging results that were available during my care of the patient were reviewed by me and considered in my medical decision making (see chart for details).     Pt presenting for evaluation of 2-year history of diarrhea and fatigue.  Physical exam reassuring, vitals are stable.  Patient does not appear overtly dehydrated.  Abdominal exam reassuring.  Labs without leukocytosis, electrolyte abnormality, or change in hemoglobin.  Creatinine stable.  Had CT scan last month without acute findings.  Has appointment with GI next week.  At this time, doubt intra-abdominal infection, perforation, obstruction, or surgical abdomen.  Discussed importance  of hydration and eating regular meals with patient.  Will give a liter of fluid for possible symptom control and have him follow-up with GI at his scheduled appointment.  At this time, patient appears safe for discharge.  Return precautions given.  Patient states he understands and agrees to plan.  Final Clinical Impressions(s) / ED Diagnoses   Final diagnoses:  Chronic diarrhea  Fatigue, unspecified type    ED Discharge Orders    None       Alveria Apley, PA-C 02/27/18 2121    Tegeler, Canary Brim, MD 02/28/18 [phone]

## 2018-03-04 ENCOUNTER — Other Ambulatory Visit (INDEPENDENT_AMBULATORY_CARE_PROVIDER_SITE_OTHER): Payer: 59

## 2018-03-04 ENCOUNTER — Ambulatory Visit (INDEPENDENT_AMBULATORY_CARE_PROVIDER_SITE_OTHER): Payer: 59 | Admitting: Gastroenterology

## 2018-03-04 ENCOUNTER — Encounter: Payer: Self-pay | Admitting: Gastroenterology

## 2018-03-04 ENCOUNTER — Encounter

## 2018-03-04 VITALS — BP 110/62 | HR 80 | Ht 64.0 in | Wt 147.0 lb

## 2018-03-04 DIAGNOSIS — K625 Hemorrhage of anus and rectum: Secondary | ICD-10-CM | POA: Diagnosis not present

## 2018-03-04 DIAGNOSIS — R5383 Other fatigue: Secondary | ICD-10-CM

## 2018-03-04 DIAGNOSIS — K219 Gastro-esophageal reflux disease without esophagitis: Secondary | ICD-10-CM

## 2018-03-04 LAB — VITAMIN B12: VITAMIN B 12: 317 pg/mL (ref 211–911)

## 2018-03-04 LAB — VITAMIN D 25 HYDROXY (VIT D DEFICIENCY, FRACTURES): VITD: 13.48 ng/mL — AB (ref 30.00–100.00)

## 2018-03-04 MED ORDER — NA SULFATE-K SULFATE-MG SULF 17.5-3.13-1.6 GM/177ML PO SOLN: 1.0000 | Freq: Once | ORAL | 0 refills | Status: AC

## 2018-03-04 MED ORDER — OMEPRAZOLE 40 MG PO CPDR: DELAYED_RELEASE_CAPSULE | ORAL | 1 refills | Status: DC

## 2018-03-04 MED FILL — OMEPRAZOLE 40 MG CPDR: 40 | 30 days supply | Qty: 60 | Fill #0

## 2018-03-04 MED FILL — SUPREP BOWEL PREP KIT: 17.5-3.13-1 | 2 days supply | Qty: 354 | Fill #0

## 2018-03-04 NOTE — Progress Notes (Signed)
03/04/2018 Keith Lawrence [id number] 06/10/1996   HISTORY OF PRESENT ILLNESS: This is a 22 year old male who was seen here previously in 2017 with complaints of reflux and vomiting.  He underwent an EGD in June 2017 by Dr. Myrtie Neither at which time the study was normal.  He presents here today with several complaints.  Complains of ongoing heartburn and reflux.  Says that he was previously on pepcid 20 mg daily with no relief and now is on omeprazole 20 mg daily with little relief.  He also complains of rectal bleeding described as bright red blood in his stool that occurs once or twice every couple of weeks for several months.  Stools are loose to soft, very little constipation or straining.  Also reports lower abdominal pain/tenderness.  Has a lot of gas.  Says that he has changed his diet a lot and no longer smokes cigarettes or marijuana, no longer drinking ETOH.  Has a lot of generalized, non-specific complaints including feeling cold all the time, feeling very fatigued and weak, pins and needles sensation in his legs. CT scan of the abdomen and pelvis with contrast was normal last month.  Very recent urine study, CBC, CMP, lipase, lactic acid, TSH were within normal limits.   Past Medical History:  Diagnosis Date  . Abdominal pain    all over pain  . ADHD (attention deficit hyperactivity disorder)   . Fatigue   . Goiter   . Hypothyroidism, acquired, autoimmune   . Iron excess   . Isosexual precocity   . Nausea and vomiting   . Physical growth delay   . SOB (shortness of breath) on exertion   . Thyroiditis, autoimmune    Past Surgical History:  Procedure Laterality Date  . CHALAZION EXCISION    . ESOPHAGOGASTRODUODENOSCOPY  03/19/2016   per Dr. Myrtie Neither, normal   . FRENULECTOMY, LINGUAL    . TONSILLECTOMY AND ADENOIDECTOMY      reports that he has quit smoking. His smoking use included cigarettes. He started smoking about 6 years ago. He smoked 0.00 packs per day. He has never  used smokeless tobacco. He reports that he has current or past drug history. Drugs: "Crack" cocaine, Other-see comments, and Marijuana. He reports that he does not drink alcohol. family history includes Heart disease in his maternal grandmother and paternal grandfather; Thyroid disease in his maternal grandmother and paternal aunt. No Known Allergies    Outpatient Encounter Medications as of 03/04/2018  Medication Sig  . omeprazole (PRILOSEC) 20 MG capsule Take 20 mg by mouth daily.  Marland Kitchen SYNTHROID 25 MCG tablet Take 1 tablet (25 mcg total) daily before breakfast by mouth.  . ondansetron (ZOFRAN ODT) 4 MG disintegrating tablet Take 1 tablet (4 mg total) by mouth every 8 (eight) hours as needed. (Patient not taking: Reported on 02/27/2018)  . [DISCONTINUED] famotidine (PEPCID) 10 MG tablet Take 10 mg by mouth 2 (two) times daily.   No facility-administered encounter medications on file as of 03/04/2018.      REVIEW OF SYSTEMS  : All other systems reviewed and negative except where noted in the History of Present Illness.   PHYSICAL EXAM: BP 110/62   Pulse 80   Ht  (1.626 m)   Wt 147 lb (66.7 kg)   BMI 25.23 kg/m  General: Well developed white male in no acute distress Head: Normocephalic and atraumatic Eyes:  Sclerae anicteric, conjunctiva pink. Ears: Normal auditory acuity Lungs: Clear throughout to auscultation; no increased WOB.  Heart: Regular rate and rhythm; no M/R/G. Abdomen: Soft, non-distended.  BS present.  Mild lower abdominal TTP. Rectal:  Will be done at the time of colonoscopy. Musculoskeletal: Symmetrical with no gross deformities  Skin: No lesions on visible extremities Extremities: No edema  Neurological: Alert oriented x 4, grossly non-focal Psychological:  Alert and cooperative. Normal mood and affect  ASSESSMENT AND PLAN: *GERD:  Currently only on omeprazole 20 mg daily.  Previous EGD normal.  Will try to maximize his acid suppression with omeprazole 40 mg  BID for one month and then decrease to 40 mg once daily. *Rectal:  Red blood, occurs once or twice every couple of weeks for quite some time.  May just be hemorrhoidal, but will schedule for colonoscopy with Dr. Myrtie Neither.   *Lower abdominal pain:  CT scan negative.  ? IBS and spasm.  Will check celiac labs due to his ongoing non-specific GI and generalized complaints. *Fatigue, weakness, pins and needles sensation:  Unsure the cause of this.  General labs including TSH and CT scan normal.  Will check vitamin B12 and Vitamin D levels, but otherwise needs to readdress this with PCP.  **The risks, benefits, and alternatives to colonoscopy were discussed with the patient and he consents to proceed.    CC:  Nelwyn Salisbury, MD

## 2018-03-04 NOTE — Patient Instructions (Signed)
Your provider has requested that you go to the basement level for lab work before leaving today. Press "B" on the elevator. The lab is located at the first door on the left as you exit the elevator.  We have sent the following medications to your pharmacy for you to pick up at your convenience: Omeprazole  You have been scheduled for a colonoscopy. Please follow written instructions given to you at your visit today.  Please pick up your prep supplies at the pharmacy within the next 1-3 days. If you use inhalers (even only as needed), please bring them with you on the day of your procedure. Your physician has requested that you go to www.startemmi.com and enter the access code given to you at your visit today. This web site gives a general overview about your procedure. However, you should still follow specific instructions given to you by our office regarding your preparation for the procedure.

## 2018-03-04 NOTE — Progress Notes (Signed)
Thank you for sending this case to me. I have reviewed the entire note, and the outlined plan seems appropriate.  Colonoscopy reasonable to work up the rectal bleeding.  He has a constellation of somatic complaints that will require primary care re-evaluation.  Amada Jupiter, MD

## 2018-03-05 ENCOUNTER — Other Ambulatory Visit (INDEPENDENT_AMBULATORY_CARE_PROVIDER_SITE_OTHER): Payer: 59

## 2018-03-05 DIAGNOSIS — K625 Hemorrhage of anus and rectum: Secondary | ICD-10-CM | POA: Diagnosis not present

## 2018-03-05 DIAGNOSIS — K219 Gastro-esophageal reflux disease without esophagitis: Secondary | ICD-10-CM | POA: Diagnosis not present

## 2018-03-05 DIAGNOSIS — R5383 Other fatigue: Secondary | ICD-10-CM

## 2018-03-05 LAB — IGA: IGA: 168 mg/dL (ref 68–378)

## 2018-03-06 ENCOUNTER — Ambulatory Visit (AMBULATORY_SURGERY_CENTER): Payer: 59 | Admitting: Gastroenterology

## 2018-03-06 ENCOUNTER — Other Ambulatory Visit: Payer: Self-pay

## 2018-03-06 ENCOUNTER — Encounter: Payer: Self-pay | Admitting: Gastroenterology

## 2018-03-06 VITALS — BP 96/65 | HR 75 | Temp 99.5°F | Resp 15 | Ht 64.0 in | Wt 147.0 lb

## 2018-03-06 DIAGNOSIS — K639 Disease of intestine, unspecified: Secondary | ICD-10-CM

## 2018-03-06 DIAGNOSIS — K625 Hemorrhage of anus and rectum: Secondary | ICD-10-CM | POA: Diagnosis not present

## 2018-03-06 DIAGNOSIS — K921 Melena: Secondary | ICD-10-CM | POA: Diagnosis present

## 2018-03-06 DIAGNOSIS — K6389 Other specified diseases of intestine: Secondary | ICD-10-CM | POA: Diagnosis not present

## 2018-03-06 LAB — TISSUE TRANSGLUTAMINASE, IGA: (TTG) AB, IGA: 1 U/mL

## 2018-03-06 MED ORDER — SODIUM CHLORIDE 0.9 % IV SOLN: 500.0000 mL | Freq: Once | INTRAVENOUS | Status: DC

## 2018-03-06 NOTE — Progress Notes (Signed)
Pt's states no medical or surgical changes since previsit or office visit. 

## 2018-03-06 NOTE — Progress Notes (Signed)
To PACU, VSS. Report to [email or web address] 

## 2018-03-06 NOTE — Op Note (Signed)
[facility] Patient Name: Keith Lawrence Procedure Date: 03/06/2018 2:41 PM MRN: [id number] Endoscopist: Sherilyn Cooter L. Myrtie Neither , MD Age: 22 Referring MD:  Date of Birth: 12/27/1995 Gender: Male Account #: [id number] Procedure:                Colonoscopy Indications:              Rectal bleeding. Medicines:                Monitored Anesthesia Care Procedure:                Pre-Anesthesia Assessment:                           - Prior to the procedure, a History and Physical                            was performed, and patient medications and                            allergies were reviewed. The patient's tolerance of                            previous anesthesia was also reviewed. The risks                            and benefits of the procedure and the sedation                            options and risks were discussed with the patient.                            All questions were answered, and informed consent                            was obtained. Prior Anticoagulants: The patient has                            taken no previous anticoagulant or antiplatelet                            agents. ASA Grade Assessment: II - A patient with                            mild systemic disease. After reviewing the risks                            and benefits, the patient was deemed in                            satisfactory condition to undergo the procedure.                           After obtaining informed consent, the colonoscope  was passed under direct vision. Throughout the                            procedure, the patient's blood pressure, pulse, and                            oxygen saturations were monitored continuously. The                            Model CF-HQ190L [id number]) scope was introduced                            through the anus and advanced to the the terminal                            ileum, with identification of the appendiceal                             orifice and IC valve. The colonoscopy was performed                            without difficulty. The patient tolerated the                            procedure well. The quality of the bowel                            preparation was excellent. The terminal ileum,                            ileocecal valve, appendiceal orifice, and rectum                            were photographed. The quality of the bowel                            preparation was evaluated using the BBPS [facility]                            Bowel Preparation Scale) with scores of: Right                            Colon = 3, Transverse Colon = 3 and Left Colon = 3                            (entire mucosa seen well with no residual staining,                            small fragments of stool or opaque liquid). The                            total BBPS score equals 9. Scope In: 2:53:58 PM Scope Out: 3:05:24 PM Scope Withdrawal Time: 0  hours 9 minutes 25 seconds  Total Procedure Duration: 0 hours 11 minutes 26 seconds  Findings:                 The perianal and digital rectal examinations were                            normal.                           A patchy area of mucosa in the terminal ileum was                            mildly erythematous. Biopsies were taken with a                            cold forceps for histology.                           The entire examined colon appeared normal on direct                            and retroflexion views. Complications:            No immediate complications. Estimated Blood Loss:     Estimated blood loss was minimal. Impression:               - Erythematous mucosa in the terminal ileum.                            Biopsied. nonspecific finding of questionable                            clinical significance.                           - The entire examined colon is normal on direct and                            retroflexion views.                            Benign anal bleeding, as no visible source seen. Recommendation:           - Patient has a contact number available for                            emergencies. The signs and symptoms of potential                            delayed complications were discussed with the                            patient. Return to normal activities tomorrow.                            Written discharge instructions were provided to the  patient.                           - Resume previous diet.                           - Continue present medications.                           - Await pathology results.                           - No recommendation at this time regarding repeat                            colonoscopy due to young age.                           - Return to primary care physician for further                            evaluation of generalized fatigue and shortness of                            breath. Keith Lawrence L. Myrtie Neither, MD 03/06/2018 3:10:20 PM This report has been signed electronically.

## 2018-03-06 NOTE — Patient Instructions (Signed)
YOU HAD AN ENDOSCOPIC PROCEDURE TODAY AT THE [facility]:   Refer to the procedure report that was given to you for any specific questions about what was found during the examination.  If the procedure report does not answer your questions, please call your gastroenterologist to clarify.  If you requested that your care partner not be given the details of your procedure findings, then the procedure report has been included in a sealed envelope for you to review at your convenience later.  YOU SHOULD EXPECT: Some feelings of bloating in the abdomen. Passage of more gas than usual.  Walking can help get rid of the air that was put into your GI tract during the procedure and reduce the bloating. If you had a lower endoscopy (such as a colonoscopy or flexible sigmoidoscopy) you may notice spotting of blood in your stool or on the toilet paper. If you underwent a bowel prep for your procedure, you may not have a normal bowel movement for a few days.  Please Note:  You might notice some irritation and congestion in your nose or some drainage.  This is from the oxygen used during your procedure.  There is no need for concern and it should clear up in a day or so.  SYMPTOMS TO REPORT IMMEDIATELY:   Following upper endoscopy (EGD)  Vomiting of blood or coffee ground material  New chest pain or pain under the shoulder blades  Painful or persistently difficult swallowing  New shortness of breath  Fever of 100F or higher  Black, tarry-looking stools  For urgent or emergent issues, a gastroenterologist can be reached at any hour by calling [phone].   DIET:  We do recommend a small meal at first, but then you may proceed to your regular diet.  Drink plenty of fluids but you should avoid alcoholic beverages for 24 hours.  ACTIVITY:  You should plan to take it easy for the rest of today and you should NOT DRIVE or use heavy machinery until tomorrow (because of the sedation medicines used  during the test).    FOLLOW UP: Our staff will call the number listed on your records the next business day following your procedure to check on you and address any questions or concerns that you may have regarding the information given to you following your procedure. If we do not reach you, we will leave a message.  However, if you are feeling well and you are not experiencing any problems, there is no need to return our call.  We will assume that you have returned to your regular daily activities without incident.  If any biopsies were taken you will be contacted by phone or by letter within the next 1-3 weeks.  Please call us at [phone] if you have not heard about the biopsies in 3 weeks.    SIGNATURES/CONFIDENTIALITY: You and/or your care partner have signed paperwork which will be entered into your electronic medical record.  These signatures attest to the fact that that the information above on your After Visit Summary has been reviewed and is understood.  Full responsibility of the confidentiality of this discharge information lies with you and/or your care-partner. 

## 2018-03-06 NOTE — Progress Notes (Signed)
Called to room to assist during endoscopic procedure.  Patient ID and intended procedure confirmed with present staff. Received instructions for my participation in the procedure from the performing physician.  

## 2018-03-09 ENCOUNTER — Telehealth: Payer: Self-pay

## 2018-03-09 NOTE — Telephone Encounter (Signed)
  Follow up Call-  Call back number 03/06/2018 03/19/2016 03/15/2016  Post procedure Call Back phone  # [phone]  Permission to leave phone message Yes Yes Yes  Some recent data might be hidden     Patient questions:  Do you have a fever, pain , or abdominal swelling? No. Pain Score  0 *  Have you tolerated food without any problems? Yes.    Have you been able to return to your normal activities? Yes.    Do you have any questions about your discharge instructions: Diet   No. Medications  No. Follow up visit  No.  Do you have questions or concerns about your Care? No.  Actions: * If pain score is 4 or above: No action needed, pain <4.

## 2018-03-11 ENCOUNTER — Telehealth: Payer: Self-pay | Admitting: Gastroenterology

## 2018-03-11 NOTE — Telephone Encounter (Signed)
Zehr, Princella PellegriniJessica D, PA-C  Loretha StaplerPhelps, Leala Bryand L, RN        Please let him know that his celiac labs were normal.    The patient has been notified of this information and all questions answered.

## 2018-03-12 ENCOUNTER — Encounter: Payer: Self-pay | Admitting: Gastroenterology

## 2018-04-02 ENCOUNTER — Telehealth: Payer: Self-pay | Admitting: Family Medicine

## 2018-04-02 NOTE — Telephone Encounter (Signed)
Copied from CRM [id number]. Topic: General - Other >> Apr 02, 2018  3:48 PM Mcneil, Ja-Kwan wrote: Reason for CRM: Pt requests to speak with a nurse to discuss food recommendations since he has been having problems with acid reflux. Pt requests call back. Cb# [phone]

## 2018-04-03 NOTE — Telephone Encounter (Signed)
Patient had  Colonoscopy  approx  1 month ago - pt reports it was negative   Has been taking prilosec 40 mg  Twice a day from gastroenterologist  Patient calling to what  foods to eat and or avoid   Reports weakness as well that started several years ago  has had numerous work ups   Patient reports weakness getting worse 3-4 months ago losing appetite makes himself eat    Pt reports constant dry mouth -  Pt reports making urine ok     Pt would like an appointment with Dr Clent RidgesFry     Pt advised to avoid alcohol avoid spicy foods avoid caffeine      Advised to eat small portions more frequently  - do not eat late at night aviod fried food and fast food  - drink lots water      Take as medication as prescribed     Will make appointment with Dr Clent RidgesFry

## 2018-04-10 ENCOUNTER — Ambulatory Visit: Payer: 59 | Admitting: Family Medicine

## 2018-04-10 DIAGNOSIS — Z0289 Encounter for other administrative examinations: Secondary | ICD-10-CM

## 2018-08-13 ENCOUNTER — Encounter: Payer: Self-pay | Admitting: Family Medicine

## 2018-08-13 ENCOUNTER — Ambulatory Visit (INDEPENDENT_AMBULATORY_CARE_PROVIDER_SITE_OTHER): Payer: 59 | Admitting: Family Medicine

## 2018-08-13 VITALS — BP 130/85 | HR 105 | Temp 99.4°F | Resp 18 | Ht 64.0 in | Wt 149.0 lb

## 2018-08-13 DIAGNOSIS — E559 Vitamin D deficiency, unspecified: Secondary | ICD-10-CM | POA: Diagnosis not present

## 2018-08-13 DIAGNOSIS — E079 Disorder of thyroid, unspecified: Secondary | ICD-10-CM

## 2018-08-13 DIAGNOSIS — R790 Abnormal level of blood mineral: Secondary | ICD-10-CM | POA: Diagnosis not present

## 2018-08-13 DIAGNOSIS — R7309 Other abnormal glucose: Secondary | ICD-10-CM | POA: Diagnosis not present

## 2018-08-13 DIAGNOSIS — E348 Other specified endocrine disorders: Secondary | ICD-10-CM

## 2018-08-13 NOTE — Patient Instructions (Signed)
  Thank you for choosing Primary Care at [facility] for your medical home!    Keith Lawrence was seen by Joaquin Courts, FNP today.   [name] primary care doctor is Bing Neighbors, FNP.   For the best care possible,  you should try to see Joaquin Courts, FNP-C  whenever you come to clinic.   We look forward to seeing you again soon!  If you have any questions about your visit today,  please call us at [phone]  Or feel free to reach your provider via MyChart.

## 2018-08-13 NOTE — Progress Notes (Signed)
Keith Lawrence, is a 22 y.o. male  NGE:[id number]  MWN:[id number]  DOB - [birth year]  CC:  Chief Complaint  Patient presents with  . Establish Care       HPI: Keith Lawrence is a 22 y.o. male is here today to establish care.   Keith Lawrence has Other specified acquired hypothyroidism; Goiter, unspecified; Physical growth delay; ADHD (attention deficit hyperactivity disorder); Goiter; Hypothyroidism, acquired, autoimmune; Thyroiditis, autoimmune; Iron excess; Social problem in school; Parent-child conflict; Outbursts of anger; Ocular herpes; Neck pain; Abdominal pain, bilateral lower quadrant; Thyroid activity decreased; Cannabinoid hyperemesis syndrome (HCC); Abdominal pain, epigastric; Striae; Polyuria; Gastroesophageal reflux disease; and Rectal bleeding on their problem list.    Today's visit:  Keith Lawrence presents today to establish care.  He has an extensive history of multiple issues most pertinent wired autoimmune hypothyroidism, goiter, GERD, history of substance abuse, ADHD and a physical growth disorder. Here today concerned for abnormal labs that he viewed on his my chart from 2015 that he reports no one ever consulted him about.  In review of the EMR I see that he was evaluated for an excessive iron level that was noted on blood work.  He was evaluated by Dr. Myna Hidalgo for hemochromatosis in 2015 which was negative. There is a note documented on the lab studies indicating that a detailed message was left with patient's stepmother at the time he reports today not knowing this information. Complains of chronic lower body pain, increased fatigue, and just not feeling like himself over the course of the last several years.  Reports due to the pain he has been unable to work and therefore has not had any of his other medications for some time.  He has been without his thyroid medicine for several months.  He is currently being followed by gastroenterology due to a rectal bleed and being treated for  GERD reports that he is no longer taking that medication due to concern of a low vitamin D level.  Review of the EMR patient did have a low vitamin D level on 03/04/2018.  Reports no treatment for low vitamin D level.  He is requesting updated blood work today to recheck his vitamin D level and iron level. Denies new headaches, abdominal pain, nausea, new weakness , numbness or tingling, SOB, edema, or worrisome cough.   Current medications: Current Outpatient Medications:  .  Iodine Strong, Lugols, (STRONG IODINE) 5 % solution, Take 0.2 mLs by mouth 3 (three) times daily., Disp: , Rfl:  .  omeprazole (PRILOSEC) 20 MG capsule, Take 20 mg by mouth daily., Disp: , Rfl:  .  omeprazole (PRILOSEC) 40 MG capsule, Take 1 capsule twice a day for once a month; then once a day ongoing (Patient not taking: Reported on 08/13/2018), Disp: 60 capsule, Rfl: 1 .  ondansetron (ZOFRAN ODT) 4 MG disintegrating tablet, Take 1 tablet (4 mg total) by mouth every 8 (eight) hours as needed. (Patient not taking: Reported on 02/27/2018), Disp: 10 tablet, Rfl: 0 .  SYNTHROID 25 MCG tablet, Take 1 tablet (25 mcg total) daily before breakfast by mouth. (Patient not taking: Reported on 03/06/2018), Disp: 30 tablet, Rfl: 5  Current Facility-Administered Medications:  .  0.9 %  sodium chloride infusion, 500 mL, Intravenous, Once, Danis, Andreas Blower, MD   Pertinent family medical history: family history includes Heart disease in his maternal grandmother and paternal grandfather; Thyroid disease in his maternal grandmother and paternal aunt.   No Known Allergies  Social History   Socioeconomic History  .  Marital status: Single    Spouse name: Not on file  . Number of children: Not on file  . Years of education: Not on file  . Highest education level: Not on file  Occupational History  . Not on file  Social Needs  . Financial resource strain: Not on file  . Food insecurity:    Worry: Not on file    Inability: Not on file   . Transportation needs:    Medical: Not on file    Non-medical: Not on file  Tobacco Use  . Smoking status: Former Smoker    Packs/day: 0.00    Types: Cigarettes    Start date: 01/11/2012  . Smokeless tobacco: Never Used  . Tobacco comment: 11-2015   quit smoking   Substance and Sexual Activity  . Alcohol use: No    Alcohol/week: 0.0 standard drinks  . Drug use: Yes    Types: "Crack" cocaine, Other-see comments, Marijuana    Comment: Stopped 5 months ago  . Sexual activity: Yes  Lifestyle  . Physical activity:    Days per week: Not on file    Minutes per session: Not on file  . Stress: Not on file  Relationships  . Social connections:    Talks on phone: Not on file    Gets together: Not on file    Attends religious service: Not on file    Active member of club or organization: Not on file    Attends meetings of clubs or organizations: Not on file    Relationship status: Not on file  . Intimate partner violence:    Fear of current or ex partner: Not on file    Emotionally abused: Not on file    Physically abused: Not on file    Forced sexual activity: Not on file  Other Topics Concern  . Not on file  Social History Narrative   Lives with foster parents. Mom still has guardianship.  [employer].     Review of Systems: Constitutional: Positive for fatigue and decreased activity tolerance. Eyes: Negative for pain, discharge, redness, itching and visual disturbance. Respiratory: Negative for cough, choking, chest tightness, shortness of breath, wheezing and stridor.  Cardiovascular: Negative for chest pain, palpitations and leg swelling. Gastrointestinal: Negative for abdominal distention or pain.. Musculoskeletal: Positive for arthralgias Neurological: Negative for dizziness, tremors, seizures, syncope, facial asymmetry, speech difficulty, weakness, light-headedness, numbness and headaches.  Hematological: Negative for adenopathy. Does not bruise/bleed  easily. Psychiatric/Behavioral: Positive dysphoric mood-secondary to concern for poor health Objective:   Vitals:   08/13/18 1600  BP: 130/85  Pulse: (!) 105  Resp: 18  Temp: 99.4 F (37.4 C)  SpO2: 96%    BP Readings from Last 3 Encounters:  08/13/18 130/85  03/06/18 96/65  03/04/18 110/62    Filed Weights   08/13/18 1600  Weight: 149 lb (67.6 kg)     Physical Exam: Constitutional: Patient appears well-developed and well-nourished.  Short- stature.   HENT: Normocephalic, atraumatic, External right and left ear normal. Oropharynx is clear and moist.  Eyes: Conjunctivae and EOM are normal. PERRLA, no scleral icterus. Neck: Normal ROM. Neck supple. No JVD. No tracheal deviation. No thyromegaly. CVS: RRR, S1/S2 +, no murmurs, no gallops, no carotid bruit.  Pulmonary: Effort and breath sounds normal, no stridor, rhonchi, wheezes, rales.  Abdominal: Soft. BS +, no distension, tenderness, rebound or guarding.  Musculoskeletal: Normal range of motion. No edema and no tenderness.  Neuro: Alert. Normal muscle tone coordination.  Normal gait. BUE and BLE strength 5/5.  Skin: Skin is warm and dry. No rash noted. Not diaphoretic. No erythema. No pallor. Psychiatric: Flat affect.  Normal judgment and appropriate responses.  Lab Results (prior encounters)  Lab Results  Component Value Date   WBC 5.9 02/27/2018   HGB 15.6 02/27/2018   HCT 43.9 02/27/2018   MCV 88.2 02/27/2018   PLT 207 02/27/2018   Lab Results  Component Value Date   CREATININE 0.95 02/27/2018   BUN 14 02/27/2018   NA 139 02/27/2018   K 3.9 02/27/2018   CL 102 02/27/2018   CO2 26 02/27/2018    Lab Results  Component Value Date   HGBA1C 5.0 06/13/2016       Assessment and plan:  1. Vitamin D deficiency Will initiate vitamin D replacement with ergocalciferol 50,000 units once weekly x 12 weeks - CBC with Differential - Vitamin D, 25-hydroxy, repeat today to obtain a current level.  2. Abnormal serum  iron level, hx Iron, TIBC and Ferritin Panel, repeat  3. Growth disorder -Previously followed by endocrinology.  No recent follow-up.  4. Thyroid disorder Documented history of autoimmune thyroid disorder.  We will repeat thyroid panel today.  Will likely go ahead and resume prior dose of levothyroxine.  Patient will be referred back to endocrinology for management and evaluation. - Thyroid Panel With TSH  5. Elevated glucose , without a diagnosis of diabetes - Hemoglobin A1c  Orders Placed This Encounter  Procedures  . CBC with Differential  . Vitamin D, 25-hydroxy  . Iron, TIBC and Ferritin Panel  . Thyroid Panel With TSH  . Hemoglobin A1c   A total of 30 minutes spent, greater than 50 % of this time was spent counseling and coordination of care.   Return in 2 weeks for follow-up of pain symptoms.  The patient was given clear instructions to go to ER or return to medical center if symptoms don't improve, worsen or new problems develop. The patient verbalized understanding. The patient was advised  to call and obtain lab results if they haven't heard anything from out office within 7-10 business days.  Joaquin Courts, FNP Primary Care at [facility] [address], [place] [address] [phone]: [phone]    This note has been created with Dragon speech recognition software and Paediatric nurse. Any transcriptional errors are unintentional.

## 2018-08-19 DIAGNOSIS — E559 Vitamin D deficiency, unspecified: Secondary | ICD-10-CM | POA: Diagnosis not present

## 2018-08-19 DIAGNOSIS — E079 Disorder of thyroid, unspecified: Secondary | ICD-10-CM | POA: Diagnosis not present

## 2018-08-19 DIAGNOSIS — R7309 Other abnormal glucose: Secondary | ICD-10-CM | POA: Diagnosis not present

## 2018-08-19 DIAGNOSIS — R632 Polyphagia: Secondary | ICD-10-CM | POA: Diagnosis not present

## 2018-08-20 LAB — CBC WITH DIFFERENTIAL/PLATELET
BASOS ABS: 0.1 10*3/uL (ref 0.0–0.2)
Basos: 1 %
EOS (ABSOLUTE): 0.1 10*3/uL (ref 0.0–0.4)
Eos: 2 %
HEMOGLOBIN: 14.1 g/dL (ref 13.0–17.7)
Hematocrit: 40.5 % (ref 37.5–51.0)
Immature Grans (Abs): 0 10*3/uL (ref 0.0–0.1)
Immature Granulocytes: 0 %
LYMPHS ABS: 1.6 10*3/uL (ref 0.7–3.1)
Lymphs: 33 %
MCH: 30.5 pg (ref 26.6–33.0)
MCHC: 34.8 g/dL (ref 31.5–35.7)
MCV: 88 fL (ref 79–97)
MONOCYTES: 11 %
MONOS ABS: 0.5 10*3/uL (ref 0.1–0.9)
NEUTROS ABS: 2.5 10*3/uL (ref 1.4–7.0)
Neutrophils: 53 %
Platelets: 269 10*3/uL (ref 150–450)
RBC: 4.62 x10E6/uL (ref 4.14–5.80)
RDW: 13.1 % (ref 12.3–15.4)
WBC: 4.7 10*3/uL (ref 3.4–10.8)

## 2018-08-20 LAB — THYROID PANEL WITH TSH
FREE THYROXINE INDEX: 1.4 (ref 1.2–4.9)
T3 UPTAKE RATIO: 23 % — AB (ref 24–39)
T4 TOTAL: 6.3 ug/dL (ref 4.5–12.0)
TSH: 2.81 u[IU]/mL (ref 0.450–4.500)

## 2018-08-20 LAB — IRON,TIBC AND FERRITIN PANEL
FERRITIN: 46 ng/mL (ref 30–400)
IRON SATURATION: 24 % (ref 15–55)
Iron: 68 ug/dL (ref 38–169)
TIBC: 288 ug/dL (ref 250–450)
UIBC: 220 ug/dL (ref 111–343)

## 2018-08-20 LAB — HEMOGLOBIN A1C
ESTIMATED AVERAGE GLUCOSE: 94 mg/dL
Hgb A1c MFr Bld: 4.9 % (ref 4.8–5.6)

## 2018-08-20 LAB — VITAMIN D 25 HYDROXY (VIT D DEFICIENCY, FRACTURES): VIT D 25 HYDROXY: 31.8 ng/mL (ref 30.0–100.0)

## 2018-08-20 NOTE — Addendum Note (Signed)
Addended by: Bing NeighborsHARRIS, Ignacia Gentzler S on: 08/20/2018 08:36 PM   Modules accepted: Orders

## 2018-08-21 NOTE — Progress Notes (Signed)
Patient notified of results & recommendations. Expressed understanding.

## 2018-08-26 ENCOUNTER — Telehealth: Payer: Self-pay | Admitting: Family Medicine

## 2018-08-26 ENCOUNTER — Other Ambulatory Visit: Payer: Self-pay

## 2018-08-26 NOTE — Telephone Encounter (Signed)
Good Afternoon  Mr Keith BergeronKey was denied by [facility] . I [facility] a [facility] to [facility] and he is no show twice there and they schedule one last appointment for 09-28-18 @ 1:15pm they called him and myself and said I'm sorry you called can't be completed at this time . Patient has an appointment with you on  08-27-18 @ 3:10pm can you please, inform the patient about the appointment and if he no showed they won't see him no more . Thank you .

## 2018-08-27 ENCOUNTER — Ambulatory Visit (INDEPENDENT_AMBULATORY_CARE_PROVIDER_SITE_OTHER): Payer: 59 | Admitting: Family Medicine

## 2018-08-27 ENCOUNTER — Encounter: Payer: Self-pay | Admitting: Family Medicine

## 2018-08-27 ENCOUNTER — Other Ambulatory Visit: Payer: Self-pay

## 2018-08-27 VITALS — BP 109/74 | HR 91 | Temp 98.2°F | Resp 16 | Wt 151.4 lb

## 2018-08-27 DIAGNOSIS — R05 Cough: Secondary | ICD-10-CM

## 2018-08-27 DIAGNOSIS — J029 Acute pharyngitis, unspecified: Secondary | ICD-10-CM | POA: Diagnosis not present

## 2018-08-27 DIAGNOSIS — E559 Vitamin D deficiency, unspecified: Secondary | ICD-10-CM

## 2018-08-27 DIAGNOSIS — H44003 Unspecified purulent endophthalmitis, bilateral: Secondary | ICD-10-CM | POA: Diagnosis not present

## 2018-08-27 DIAGNOSIS — R059 Cough, unspecified: Secondary | ICD-10-CM

## 2018-08-27 MED ORDER — VALACYCLOVIR HCL 1 G PO TABS: 1000.0000 mg | ORAL_TABLET | Freq: Every day | ORAL | 0 refills | Status: AC

## 2018-08-27 MED ORDER — BENZONATATE 100 MG PO CAPS: 100.0000 mg | ORAL_CAPSULE | Freq: Three times a day (TID) | ORAL | 0 refills | Status: DC | PRN

## 2018-08-27 MED ORDER — BACITRACIN-POLYMYXIN B 500-10000 UNIT/GM OP OINT: 1.0000 "application " | TOPICAL_OINTMENT | Freq: Two times a day (BID) | OPHTHALMIC | 0 refills | Status: AC

## 2018-08-27 MED ORDER — VITAMIN D (ERGOCALCIFEROL) 1.25 MG (50000 UNIT) PO CAPS: 50000.0000 [IU] | ORAL_CAPSULE | ORAL | 0 refills | Status: DC

## 2018-08-27 MED FILL — valACYclovir HCL 1 GM TABS: 1 | 10 days supply | Qty: 10 | Fill #0

## 2018-08-27 MED FILL — BENZONATATE 100 MG CAP: 100 | 6 days supply | Qty: 40 | Fill #0

## 2018-08-27 MED FILL — BACITRACIN-POLYMYXIN EYE OI: 500-10000 | 3 days supply | Qty: 4 | Fill #0

## 2018-08-27 MED FILL — VIT D2 1.25 MG (50,000 UNIT: 1.25 MG | 84 days supply | Qty: 12 | Fill #0

## 2018-08-27 NOTE — Progress Notes (Signed)
Patient ID: Keith Lawrence, male    DOB: 29-Aug-1996, 22 y.o.   MRN: [id number]  PCP: Keith Neighbors, Keith Lawrence  Chief Complaint  Patient presents with  . Follow-up    Subjective:  HPI Keith Lawrence is Lawrence 22 y.o. male presents for evaluation of eye infection and URI.  Eye infection Complains of eye drainage and crusting occurring upon awakening in the morning.  Reports Lawrence history of HSV of the eyes which improved with antivirals. Complains of mostly itching. Symptoms present for only 1 days. Eyes are increasing red and watery. Denies visual disturbances.   URI  He complains today of sore throat, cough, and mild congestion. Denies headache, fever, chills, nausea, or body aches. He has not attempted medication for his cough. No history of asthma or bronchitis.    Social History   Socioeconomic History  . Marital status: Single    Spouse name: Not on file  . Number of children: Not on file  . Years of education: Not on file  . Highest education level: Not on file  Occupational History  . Not on file  Social Needs  . Financial resource strain: Not on file  . Food insecurity:    Worry: Not on file    Inability: Not on file  . Transportation needs:    Medical: Not on file    Non-medical: Not on file  Tobacco Use  . Smoking status: Former Smoker    Packs/day: 0.00    Types: Cigarettes    Start date: 01/11/2012  . Smokeless tobacco: Never Used  . Tobacco comment: 11-2015   quit smoking   Substance and Sexual Activity  . Alcohol use: No    Alcohol/week: 0.0 standard drinks  . Drug use: Yes    Types: "Crack" cocaine, Other-see comments, Marijuana    Comment: Stopped 5 months ago  . Sexual activity: Yes  Lifestyle  . Physical activity:    Days per week: Not on file    Minutes per session: Not on file  . Stress: Not on file  Relationships  . Social connections:    Talks on phone: Not on file    Gets together: Not on file    Attends religious service: Not on  file    Active member of club or organization: Not on file    Attends meetings of clubs or organizations: Not on file    Relationship status: Not on file  . Intimate partner violence:    Fear of current or ex partner: Not on file    Emotionally abused: Not on file    Physically abused: Not on file    Forced sexual activity: Not on file  Other Topics Concern  . Not on file  Social History Narrative   Lives with foster parents. Mom still has guardianship.  [employer].     Family History  Problem Relation Age of Onset  . Thyroid disease Maternal Grandmother   . Heart disease Maternal Grandmother   . Thyroid disease Paternal Aunt   . Heart disease Paternal Grandfather   . Diabetes Neg Hx   . Colon cancer Neg Hx     Review of Systems Pertinent negatives listed in HPI  Patient Active Problem List   Diagnosis Date Noted  . Gastroesophageal reflux disease 03/04/2018  . Rectal bleeding 03/04/2018  . Striae 05/18/2016  . Thyroid activity decreased 01/31/2016  . Cannabinoid hyperemesis syndrome (HCC) 01/31/2016  . 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 Parent-child conflict  02/23/2013  . Outbursts of anger 02/23/2013  . Physical growth delay   . ADHD (attention deficit hyperactivity disorder)   . Goiter   . Hypothyroidism, acquired, autoimmune   . Thyroiditis, autoimmune   . Iron excess   . Other specified acquired hypothyroidism 02/28/2011    No Known Allergies  Prior to Admission medications   Medication Sig Start Date End Date Taking? Authorizing Provider  Iodine Strong, Lugols, (STRONG IODINE) 5 % solution Take 0.2 mLs by mouth 3 (three) times daily.   Yes Provider, Historical, Keith Lawrence  omeprazole (PRILOSEC) 40 MG capsule Take 1 capsule twice Lawrence day for once Lawrence month; then once Lawrence day ongoing 03/04/18  Yes Keith Lawrence, Keith BumpsJessica Lawrence, Keith Lawrence  SYNTHROID 25 MCG tablet Take 1 tablet (25 mcg total) daily before breakfast by mouth. 08/14/17  Yes Keith Lawrence, Keith Lawrence, Keith Lawrence  ondansetron (ZOFRAN ODT) 4 MG  disintegrating tablet Take 1 tablet (4 mg total) by mouth every 8 (eight) hours as needed. Patient not taking: Reported on 08/27/2018 01/22/18   Keith Lawrence, Julie, Keith Lawrence    Past Medical, Surgical Family and Social History reviewed and updated.    Objective:   Today's Vitals   08/27/18 1515  BP: 109/74  Pulse: 91  Resp: 16  Temp: 98.2 F (36.8 C)  TempSrc: Oral  SpO2: 96%  Weight: 151 lb 6.4 oz (68.7 kg)  PainSc: 8   PainLoc: Generalized    Wt Readings from Last 3 Encounters:  08/27/18 151 lb 6.4 oz (68.7 kg)  08/13/18 149 lb (67.6 kg)  03/06/18 147 lb (66.7 kg)   Physical Exam General appearance: alert, well developed, well nourished, cooperative and in no distress HENT: Normocephalic, without obvious abnormality, atraumatic. Nares mild erythema present. Oropharynx clear, negative of erythema or exdudate. Eyes: conjunctiva bilateral erythema and dried exudate present. Mucoid-type drainage present.  Respiratory: Respirations even and unlabored, normal respiratory rate Heart: rate and rhythm normal. No gallop or murmurs noted on exam  Extremities: No gross deformities Skin: Skin color, texture, turgor normal. No rashes seen  Psych: Appropriate mood and affect. Neurologic: Mental status: Alert, oriented to person, place, and time, thought content appropriate.   Assessment & Plan:  1. Vitamin Lawrence deficiency -Resolved. Normal vitamin Lawrence level with vitamin Lawrence replacement tablets.   Recommended discontinued daily vitamin Lawrence and starting Lawrence 12 weeks once weekly regimen with vitamin Lawrence.  2. Sore throat, non, infectious appearing. Recommend warm water gargles and salt water.   3. Infection of both eyes, likely conjunctivitis -pt insistent that infection is HSV, will trial Valtrex, however recommended to apply antibiotic eye drops first and if infection, resolves he can forego starting antiviral treatment. Patient agreed to plan.  4. Cough, viral likely. Start benzonatate 100-200 mg TID as  needed for cough   Meds ordered this encounter  Medications  . Vitamin Lawrence, Ergocalciferol, (DRISDOL) 1.25 MG (50000 UT) CAPS capsule    Sig: Take 1 capsule (50,000 Units total) by mouth every 7 (seven) days.    Dispense:  12 capsule    Refill:  0  . valACYclovir (VALTREX) 1000 MG tablet    Sig: Take 1 tablet (1,000 mg total) by mouth daily for 10 days.    Dispense:  10 tablet    Refill:  0  . benzonatate (TESSALON) 100 MG capsule    Sig: Take 1-2 capsules (100-200 mg total) by mouth 3 (three) times daily as needed for cough.    Dispense:  40 capsule    Refill:  0  .  bacitracin-polymyxin b (POLYSPORIN) ophthalmic ointment    Sig: Place 1 application into both eyes 2 (two) times daily for 7 days. apply to eye every 12 hours while awake    Dispense:  4.9 g    Refill:  0    -The patient was given clear instructions to go to ER or return to medical center if symptoms do not improve, worsen or new problems develop. The patient verbalized understanding.    Joaquin Courts, Keith Lawrence Primary Care at [facility] [address], [place] [address] [phone]: [phone]

## 2018-08-27 NOTE — Patient Instructions (Addendum)
You have been referred to [facility]  [address] [place], [place] [address] Phone: [phone] APPOINTMENT IS SCHEDULED 09/28/2018 AT 1:15 PM Call if unable to make it to scheduled appointment.  Viral Illness, Adult Viruses are tiny germs that can get into a person's body and cause illness. There are many different types of viruses, and they cause many types of illness. Viral illnesses can range from mild to severe. They can affect various parts of the body. Common illnesses that are caused by a virus include colds and the flu. Viral illnesses also include serious conditions such as HIV/AIDS (human immunodeficiency virus/acquired immunodeficiency syndrome). A few viruses have been linked to certain cancers. What are the causes? Many types of viruses can cause illness. Viruses invade cells in your body, multiply, and cause the infected cells to malfunction or die. When the cell dies, it releases more of the virus. When this happens, you develop symptoms of the illness, and the virus continues to spread to other cells. If the virus takes over the function of the cell, it can cause the cell to divide and grow out of control, as is the case when a virus causes cancer. Different viruses get into the body in different ways. You can get a virus by:  Swallowing food or water that is contaminated with the virus.  Breathing in droplets that have been coughed or sneezed into the air by an infected person.  Touching a surface that has been contaminated with the virus and then touching your eyes, nose, or mouth.  Being bitten by an insect or animal that carries the virus.  Having sexual contact with a person who is infected with the virus.  Being exposed to blood or fluids that contain the virus, either through an open cut or during a transfusion.  If a virus enters your body, your body's defense system (immune system) will try to fight the virus. You may be at higher risk for a  viral illness if your immune system is weak. What are the signs or symptoms? Symptoms vary depending on the type of virus and the location of the cells that it invades. Common symptoms of the main types of viral illnesses include: Cold and flu viruses  Fever.  Headache.  Sore throat.  Muscle aches.  Nasal congestion.  Cough. Digestive system (gastrointestinal) viruses  Fever.  Abdominal pain.  Nausea.  Diarrhea. Liver viruses (hepatitis)  Loss of appetite.  Tiredness.  Yellowing of the skin (jaundice). Brain and spinal cord viruses  Fever.  Headache.  Stiff neck.  Nausea and vomiting.  Confusion or sleepiness. Skin viruses  Warts.  Itching.  Rash. Sexually transmitted viruses  Discharge.  Swelling.  Redness.  Rash. How is this treated? Viruses can be difficult to treat because they live within cells. Antibiotic medicines do not treat viruses because these drugs do not get inside cells. Treatment for a viral illness may include:  Resting and drinking plenty of fluids.  Medicines to relieve symptoms. These can include over-the-counter medicine for pain and fever, medicines for cough or congestion, and medicines to relieve diarrhea.  Antiviral medicines. These drugs are available only for certain types of viruses. They may help reduce flu symptoms if taken early. There are also many antiviral medicines for hepatitis and HIV/AIDS.  Some viral illnesses can be prevented with vaccinations. A common example is the flu shot. Follow these instructions at home: Medicines   Take over-the-counter and prescription medicines only as told by your health  care provider.  If you were prescribed an antiviral medicine, take it as told by your health care provider. Do not stop taking the medicine even if you start to feel better.  Be aware of when antibiotics are needed and when they are not needed. Antibiotics do not treat viruses. If your health care provider  thinks that you may have a bacterial infection as well as a viral infection, you may get an antibiotic. ? Do not ask for an antibiotic prescription if you have been diagnosed with a viral illness. That will not make your illness go away faster. ? Frequently taking antibiotics when they are not needed can lead to antibiotic resistance. When this develops, the medicine no longer works against the bacteria that it normally fights. General instructions  Drink enough fluids to keep your urine clear or pale yellow.  Rest as much as possible.  Return to your normal activities as told by your health care provider. Ask your health care provider what activities are safe for you.  Keep all follow-up visits as told by your health care provider. This is important. How is this prevented? Take these actions to reduce your risk of viral infection:  Eat a healthy diet and get enough rest.  Wash your hands often with soap and water. This is especially important when you are in public places. If soap and water are not available, use hand sanitizer.  Avoid close contact with friends and family who have a viral illness.  If you travel to areas where viral gastrointestinal infection is common, avoid drinking water or eating raw food.  Keep your immunizations up to date. Get a flu shot every year as told by your health care provider.  Do not share toothbrushes, nail clippers, razors, or needles with other people.  Always practice safe sex.  Contact a health care provider if:  You have symptoms of a viral illness that do not go away.  Your symptoms come back after going away.  Your symptoms get worse. Get help right away if:  You have trouble breathing.  You have a severe headache or a stiff neck.  You have severe vomiting or abdominal pain. This information is not intended to replace advice given to you by your health care provider. Make sure you discuss any questions you have with your health  care provider. Document Released: 02/02/2016 Document Revised: 03/06/2016 Document Reviewed: 02/02/2016 Elsevier Interactive Patient Education  [employer]2018 [employer]

## 2018-08-27 NOTE — Progress Notes (Signed)
Mucous over left eye in the morning x 1 week.  Sore throat and cough noticed this a.m.  Off synthroid x 4-5 mos.

## 2018-08-28 NOTE — Telephone Encounter (Signed)
Patient notified of appt and information provided on AVS

## 2018-10-12 DIAGNOSIS — Z6827 Body mass index (BMI) 27.0-27.9, adult: Secondary | ICD-10-CM | POA: Diagnosis not present

## 2018-10-12 DIAGNOSIS — E039 Hypothyroidism, unspecified: Secondary | ICD-10-CM | POA: Diagnosis not present

## 2018-10-12 DIAGNOSIS — R5383 Other fatigue: Secondary | ICD-10-CM | POA: Diagnosis not present

## 2018-10-23 DIAGNOSIS — R5383 Other fatigue: Secondary | ICD-10-CM | POA: Diagnosis not present

## 2018-10-23 DIAGNOSIS — E039 Hypothyroidism, unspecified: Secondary | ICD-10-CM | POA: Diagnosis not present

## 2018-11-05 MED FILL — DEXAMETHASONE 1 MG TABLET: 1 | 1 days supply | Qty: 1 | Fill #0

## 2018-11-11 DIAGNOSIS — Z6827 Body mass index (BMI) 27.0-27.9, adult: Secondary | ICD-10-CM | POA: Diagnosis not present

## 2018-11-11 DIAGNOSIS — R5383 Other fatigue: Secondary | ICD-10-CM | POA: Diagnosis not present

## 2018-11-11 DIAGNOSIS — E039 Hypothyroidism, unspecified: Secondary | ICD-10-CM | POA: Diagnosis not present

## 2018-11-11 MED FILL — LEVOTHYROXINE 50 MCG TABLET: 50 | 30 days supply | Qty: 30 | Fill #0

## 2018-12-24 ENCOUNTER — Ambulatory Visit: Payer: 59 | Admitting: Family Medicine

## 2018-12-29 MED FILL — LEVOTHYROXINE 50 MCG TABLET: 50 | 30 days supply | Qty: 30 | Fill #1

## 2019-01-11 DIAGNOSIS — G4719 Other hypersomnia: Secondary | ICD-10-CM | POA: Diagnosis not present

## 2019-01-12 DIAGNOSIS — Z6827 Body mass index (BMI) 27.0-27.9, adult: Secondary | ICD-10-CM | POA: Diagnosis not present

## 2019-01-12 DIAGNOSIS — R5383 Other fatigue: Secondary | ICD-10-CM | POA: Diagnosis not present

## 2019-01-12 DIAGNOSIS — E039 Hypothyroidism, unspecified: Secondary | ICD-10-CM | POA: Diagnosis not present

## 2019-02-17 MED FILL — LEVOTHYROXINE 50 MCG TABLET: 50 | 90 days supply | Qty: 90 | Fill #0

## 2019-02-25 ENCOUNTER — Ambulatory Visit: Payer: 59 | Admitting: Family Medicine

## 2019-04-06 DIAGNOSIS — E039 Hypothyroidism, unspecified: Secondary | ICD-10-CM | POA: Diagnosis not present

## 2019-04-13 DIAGNOSIS — E039 Hypothyroidism, unspecified: Secondary | ICD-10-CM | POA: Diagnosis not present

## 2019-04-13 DIAGNOSIS — Z6827 Body mass index (BMI) 27.0-27.9, adult: Secondary | ICD-10-CM | POA: Diagnosis not present

## 2019-04-13 DIAGNOSIS — H5789 Other specified disorders of eye and adnexa: Secondary | ICD-10-CM | POA: Diagnosis not present

## 2019-04-13 DIAGNOSIS — R5383 Other fatigue: Secondary | ICD-10-CM | POA: Diagnosis not present

## 2019-04-14 DIAGNOSIS — H10503 Unspecified blepharoconjunctivitis, bilateral: Secondary | ICD-10-CM | POA: Diagnosis not present

## 2019-04-14 DIAGNOSIS — B0052 Herpesviral keratitis: Secondary | ICD-10-CM | POA: Diagnosis not present

## 2019-04-14 DIAGNOSIS — H18452 Nodular corneal degeneration, left eye: Secondary | ICD-10-CM | POA: Diagnosis not present

## 2019-04-22 DIAGNOSIS — H10503 Unspecified blepharoconjunctivitis, bilateral: Secondary | ICD-10-CM | POA: Diagnosis not present

## 2019-04-22 DIAGNOSIS — H18452 Nodular corneal degeneration, left eye: Secondary | ICD-10-CM | POA: Diagnosis not present

## 2019-04-22 DIAGNOSIS — B0052 Herpesviral keratitis: Secondary | ICD-10-CM | POA: Diagnosis not present

## 2019-04-27 ENCOUNTER — Ambulatory Visit
Admission: EM | Admit: 2019-04-27 | Discharge: 2019-04-27 | Disposition: A | Discharge status: Home / Self Care | Payer: 59 | Attending: Physician Assistant | Admitting: Physician Assistant

## 2019-04-27 ENCOUNTER — Other Ambulatory Visit: Payer: Self-pay

## 2019-04-27 DIAGNOSIS — R300 Dysuria: Secondary | ICD-10-CM | POA: Insufficient documentation

## 2019-04-27 LAB — POCT URINALYSIS DIP (MANUAL ENTRY)
Bilirubin, UA: NEGATIVE
Blood, UA: NEGATIVE
Glucose, UA: NEGATIVE mg/dL
Ketones, POC UA: NEGATIVE mg/dL
Leukocytes, UA: NEGATIVE
Nitrite, UA: NEGATIVE
Protein Ur, POC: NEGATIVE mg/dL
Spec Grav, UA: 1.02 (ref 1.010–1.025)
Urobilinogen, UA: 0.2 E.U./dL
pH, UA: 7.5 (ref 5.0–8.0)

## 2019-04-27 MED ORDER — AZITHROMYCIN 500 MG PO TABS
1000.0000 mg | ORAL_TABLET | Freq: Once | ORAL | Status: AC
  Administered 2019-04-27: 1000 mg via ORAL

## 2019-04-27 MED ORDER — CEFTRIAXONE SODIUM 250 MG IJ SOLR
250.0000 mg | Freq: Once | INTRAMUSCULAR | Status: AC
  Administered 2019-04-27: 250 mg via INTRAMUSCULAR

## 2019-04-27 NOTE — ED Provider Notes (Signed)
[name]    CSN: [id number] Arrival date & time: 04/27/19  1051     History   Chief Complaint Chief Complaint  Patient presents with  . Urinary Tract Infection    HPI Swade Shonka Sprick is a 23 y.o. male.   23 year old male comes in for few day history of dysuria.  Denies urinary frequency, hematuria.  Denies abdominal pain, nausea, vomiting.  Denies fever, chills, night sweats.  Denies penile discharge, penile lesion, testicular swelling, testicular pain.  He does note some redness to the tip of the penis.  States had unprotected intercourse.  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     Past Medical History:  Diagnosis Date  . Abdominal pain    all over pain  . ADHD (attention deficit hyperactivity disorder)   . Fatigue   . Goiter   . Hypothyroidism, acquired, autoimmune   . Iron excess   . Isosexual precocity   . Nausea and vomiting   . Physical growth delay   . SOB (shortness of breath) on exertion   . Thyroiditis, autoimmune     Patient Active Problem List   Diagnosis Date Noted  . Gastroesophageal reflux disease 03/04/2018  . Rectal bleeding 03/04/2018  . Striae 05/18/2016  . Thyroid activity decreased 01/31/2016  . Cannabinoid hyperemesis syndrome ([place]) 01/31/2016  . 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 Parent-child conflict 63/14/9702  . Outbursts of anger 02/23/2013  . Physical growth delay   . ADHD (attention deficit hyperactivity disorder)   . Goiter   . Hypothyroidism, acquired, autoimmune   . Thyroiditis, autoimmune   . Iron excess   . Other specified acquired hypothyroidism 02/28/2011    Past Surgical History:  Procedure Laterality Date  . CHALAZION EXCISION    . ESOPHAGOGASTRODUODENOSCOPY  03/19/2016   per Dr. Loletha Carrow, normal   . FRENULECTOMY, LINGUAL    . TONSILLECTOMY AND ADENOIDECTOMY         Home Medications    Prior to Admission medications   Medication Sig Start Date End  Date Taking? Authorizing Provider  doxycycline (DORYX) 100 MG EC tablet Take 100 mg by mouth 2 (two) times daily.   Yes [provider]  SYNTHROID 25 MCG tablet Take 1 tablet (25 mcg total) daily before breakfast by mouth. 08/14/17   Laurey Morale, MD    Family History Family History  Problem Relation Age of Onset  . Thyroid disease Maternal Grandmother   . Heart disease Maternal Grandmother   . Thyroid disease Paternal Aunt   . Heart disease Paternal Grandfather   . Diabetes Neg Hx   . Colon cancer Neg Hx     Social History Social History   Tobacco Use  . Smoking status: Former Smoker    Packs/day: 0.00    Types: Cigarettes    Start date: 01/11/2012  . Smokeless tobacco: Never Used  . Tobacco comment: 11-2015   quit smoking   Substance Use Topics  . Alcohol use: Yes    Alcohol/week: 0.0 standard drinks  . Drug use: Yes    Types: "Crack" cocaine, Other-see comments, Marijuana    Comment: Stopped 5 months ago     Allergies   Patient has no known allergies.   Review of Systems Review of Systems  Reason unable to perform ROS: See HPI as above.     Physical Exam Triage Vital Signs ED Triage Vitals [04/27/19 1101]  Enc Vitals Group     BP Marland Kitchen)  150/77     Pulse Rate 69     Resp 18     Temp 98.5 F (36.9 C)     Temp Source Oral     SpO2 96 %     Weight      Height      Head Circumference      Peak Flow      Pain Score 7     Pain Loc      Pain Edu?      Excl. in GC?    No data found.  Updated Vital Signs BP (!) 150/77 (BP Location: Left Arm)   Pulse 69   Temp 98.5 F (36.9 C) (Oral)   Resp 18   SpO2 96%   Physical Exam Exam conducted with a chaperone present.  Constitutional:      General: He is not in acute distress.    Appearance: He is well-developed. He is not diaphoretic.  HENT:     Head: Normocephalic and atraumatic.  Eyes:     Conjunctiva/sclera: Conjunctivae normal.     Pupils: Pupils are equal, round, and reactive to light.   Genitourinary:    Comments: No erythema/rash to the glans/penis. No testicular swelling/erythema.  Neurological:     Mental Status: He is alert and oriented to person, place, and time.      UC Treatments / Results  Labs (all labs ordered are listed, but only abnormal results are displayed) Labs Reviewed  POCT URINALYSIS DIP (MANUAL ENTRY)  URINE CYTOLOGY ANCILLARY ONLY    EKG   Radiology No results found.  Procedures Procedures (including critical care time)  Medications Ordered in UC Medications  azithromycin (ZITHROMAX) tablet 1,000 mg (1,000 mg Oral Given 04/27/19 1138)  cefTRIAXone (ROCEPHIN) injection 250 mg (250 mg Intramuscular Given 04/27/19 1138)    Initial Impression / Assessment and Plan / UC Course  I have reviewed the triage vital signs and the nursing notes.  Pertinent labs & imaging results that were available during my care of the patient were reviewed by me and considered in my medical decision making (see chart for details).  Patient was treated empirically for gonorrhea/chlamydia. Azithromycin and Rocephin given in office today. Cytology sent, patient will be contacted with any positive results that require additional treatment. Patient to refrain from sexual activity for the next 7 days. Return precautions given.   Final Clinical Impressions(s) / UC Diagnoses   Final diagnoses:  Dysuria    ED Prescriptions    None        Belinda FisherYu, Amy V, PA-C 04/27/19 1352

## 2019-04-27 NOTE — Discharge Instructions (Signed)
You were treated empirically for gonorrhea. Azithromycin 1g by mouth and Rocephin 250mg  injection given in office today. Cytology sent, you will be contacted with any positive results that requires further treatment. Refrain from sexual activity and alcohol use for the next 7 days. If testing negative with continued symptoms, follow up with urologist for further evaluation needed.

## 2019-04-27 NOTE — ED Triage Notes (Signed)
Pt c/o urinary difficulty with pain and burning, hx of UTI. States pain at tip of penis and has had unprotective intercourse.

## 2019-04-28 LAB — URINE CYTOLOGY ANCILLARY ONLY
Chlamydia: NEGATIVE
Neisseria Gonorrhea: NEGATIVE
Trichomonas: NEGATIVE

## 2019-05-03 ENCOUNTER — Telehealth (HOSPITAL_COMMUNITY): Payer: Self-pay | Admitting: Emergency Medicine

## 2019-05-03 NOTE — Telephone Encounter (Signed)
Patient called stating his labs were negative, per Amy discharge instructions, pt needs follow up with urology. Pt given Urology follow up info. All questions answered.

## 2019-05-04 ENCOUNTER — Other Ambulatory Visit: Payer: Self-pay

## 2019-05-04 ENCOUNTER — Encounter: Payer: Self-pay | Admitting: Emergency Medicine

## 2019-05-04 ENCOUNTER — Ambulatory Visit
Admission: EM | Admit: 2019-05-04 | Discharge: 2019-05-04 | Disposition: A | Discharge status: Home / Self Care | Payer: 59 | Attending: Emergency Medicine | Admitting: Emergency Medicine

## 2019-05-04 ENCOUNTER — Telehealth: Payer: Self-pay | Admitting: Emergency Medicine

## 2019-05-04 DIAGNOSIS — J309 Allergic rhinitis, unspecified: Secondary | ICD-10-CM

## 2019-05-04 DIAGNOSIS — H9202 Otalgia, left ear: Secondary | ICD-10-CM | POA: Diagnosis not present

## 2019-05-04 MED ORDER — FLUTICASONE PROPIONATE 50 MCG/ACT NA SUSP: 1.0000 | Freq: Every day | NASAL | 0 refills | Status: DC

## 2019-05-04 MED ORDER — CIPROFLOXACIN HCL 0.3 % OP SOLN: 4.0000 [drp] | Freq: Two times a day (BID) | OPHTHALMIC | 0 refills | Status: DC

## 2019-05-04 MED ORDER — CIPROFLOXACIN-DEXAMETHASONE 0.3-0.1 % OT SUSP: 4.0000 [drp] | Freq: Two times a day (BID) | OTIC | 0 refills | Status: DC

## 2019-05-04 NOTE — ED Triage Notes (Signed)
Pt presents to [facility] for assessment of left ear pain with slightly sore throat and left facial pain

## 2019-05-04 NOTE — Telephone Encounter (Signed)
Patient called stating the antibiotic he was prescribed is too expensive.  [place], Kemp addressed and sent another prescription for patient.

## 2019-05-04 NOTE — Discharge Instructions (Addendum)
Use Flonase and eardrops as directed. May alternate between ibuprofen and Tylenol for pain relief. Return for worsening fever, throat pain, ear pain.

## 2019-05-04 NOTE — ED Notes (Signed)
Patient able to ambulate independently  

## 2019-05-04 NOTE — ED Provider Notes (Signed)
[name]    CSN: [id number] Arrival date & time: 05/04/19  1313     History   Chief Complaint Chief Complaint  Patient presents with  . Otalgia    HPI Keith Lawrence is a 23 y.o. male with history of hypothyroidism, chronic fatigue presenting for 3-day course of left ear and throat pain.  States this started with ear pain, developed throat pain yesterday.  Denies decreased hearing, tinnitus, headache, recent travel or prolonged water exposure.  Patient has taken ibuprofen with mild relief of symptoms.    Past Medical History:  Diagnosis Date  . Abdominal pain    all over pain  . ADHD (attention deficit hyperactivity disorder)   . Fatigue   . Goiter   . Hypothyroidism, acquired, autoimmune   . Iron excess   . Isosexual precocity   . Nausea and vomiting   . Physical growth delay   . SOB (shortness of breath) on exertion   . Thyroiditis, autoimmune     Patient Active Problem List   Diagnosis Date Noted  . Gastroesophageal reflux disease 03/04/2018  . Rectal bleeding 03/04/2018  . Striae 05/18/2016  . Thyroid activity decreased 01/31/2016  . Cannabinoid hyperemesis syndrome ([facility]) 01/31/2016  . 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 Parent-child conflict 02/23/2013  . Outbursts of anger 02/23/2013  . Physical growth delay   . ADHD (attention deficit hyperactivity disorder)   . Goiter   . Hypothyroidism, acquired, autoimmune   . Thyroiditis, autoimmune   . Iron excess   . Other specified acquired hypothyroidism 02/28/2011    Past Surgical History:  Procedure Laterality Date  . CHALAZION EXCISION    . ESOPHAGOGASTRODUODENOSCOPY  03/19/2016   per Dr. Myrtie Neitheranis, normal   . FRENULECTOMY, LINGUAL    . TONSILLECTOMY AND ADENOIDECTOMY         Home Medications    Prior to Admission medications   Medication Sig Start Date End Date Taking? Authorizing Provider  ciprofloxacin-dexamethasone (CIPRODEX) OTIC suspension Place 4 drops into the left ear 2  (two) times daily. 05/04/19   Hall-Potvin, [place], PA-C  doxycycline (DORYX) 100 MG EC tablet Take 100 mg by mouth 2 (two) times daily.    [provider]  fluticasone (FLONASE) 50 MCG/ACT nasal spray Place 1 spray into both nostrils daily. 05/04/19   Hall-Potvin, [place], PA-C  SYNTHROID 25 MCG tablet Take 1 tablet (25 mcg total) daily before breakfast by mouth. 08/14/17   Nelwyn SalisburyFry, Stephen A, MD    Family History Family History  Problem Relation Age of Onset  . Thyroid disease Maternal Grandmother   . Heart disease Maternal Grandmother   . Thyroid disease Paternal Aunt   . Heart disease Paternal Grandfather   . Diabetes Neg Hx   . Colon cancer Neg Hx     Social History Social History   Tobacco Use  . Smoking status: Former Smoker    Packs/day: 0.00    Types: Cigarettes    Start date: 01/11/2012  . Smokeless tobacco: Never Used  . Tobacco comment: 11-2015   quit smoking   Substance Use Topics  . Alcohol use: Yes    Alcohol/week: 0.0 standard drinks  . Drug use: Yes    Types: "Crack" cocaine, Other-see comments, Marijuana    Comment: Stopped 5 months ago     Allergies   Patient has no known allergies.   Review of Systems Review of Systems  Constitutional: Negative for activity change, appetite change, fatigue and fever.  HENT: Positive for ear  pain and sore throat. Negative for congestion, dental problem, ear discharge, facial swelling, hearing loss, sinus pressure, sinus pain, tinnitus, trouble swallowing and voice change.   Eyes: Negative for pain, discharge and redness.  Respiratory: Negative for cough and shortness of breath.   Cardiovascular: Negative for chest pain and palpitations.  Gastrointestinal: Negative for abdominal pain, diarrhea and vomiting.  Endocrine: Negative for cold intolerance, heat intolerance, polydipsia, polyphagia and polyuria.  Musculoskeletal: Negative for arthralgias and myalgias.  Skin: Negative for rash and wound.  Neurological:  Negative for speech difficulty and headaches.  All other systems reviewed and are negative.    Physical Exam Triage Vital Signs ED Triage Vitals  Enc Vitals Group     BP 05/04/19 1322 116/79     Pulse Rate 05/04/19 1322 91     Resp 05/04/19 1322 18     Temp 05/04/19 1322 99.4 F (37.4 C)     Temp Source 05/04/19 1322 Oral     SpO2 05/04/19 1322 96 %     Weight --      Height --      Head Circumference --      Peak Flow --      Pain Score 05/04/19 1323 10     Pain Loc --      Pain Edu? --      Excl. in GC? --    No data found.  Updated Vital Signs BP 116/79 (BP Location: Left Arm)   Pulse 91   Temp 99.4 F (37.4 C) (Oral)   Resp 18   SpO2 96%   Visual Acuity Right Eye Distance:   Left Eye Distance:   Bilateral Distance:    Right Eye Near:   Left Eye Near:    Bilateral Near:     Physical Exam Constitutional:      General: He is not in acute distress.    Appearance: He is not toxic-appearing.  HENT:     Head: Normocephalic and atraumatic.     Right Ear: Tympanic membrane, ear canal and external ear normal.     Left Ear: Tympanic membrane and external ear normal.     Ears:     Comments: Mild tragal tenderness.  Left EAC with minimal edema and erythema as compared to right.  No discharge or TM perforation.  Mild amount of clear fluid behind TM with inner ear landmarks in appropriate position    Nose: No congestion.     Comments: Bilateral turbinate edema with mucosal pallor    Mouth/Throat:     Mouth: Mucous membranes are moist.     Pharynx: Oropharynx is clear. No oropharyngeal exudate or posterior oropharyngeal erythema.  Eyes:     General: No scleral icterus.    Conjunctiva/sclera: Conjunctivae normal.     Pupils: Pupils are equal, round, and reactive to light.  Neck:     Musculoskeletal: Normal range of motion and neck supple. No muscular tenderness.  Cardiovascular:     Rate and Rhythm: Normal rate.  Pulmonary:     Effort: Pulmonary effort is  normal.  Lymphadenopathy:     Cervical: No cervical adenopathy.  Skin:    Coloration: Skin is not jaundiced or pale.  Neurological:     Mental Status: He is alert and oriented to person, place, and time.      UC Treatments / Results  Labs (all labs ordered are listed, but only abnormal results are displayed) Labs Reviewed - No data to display  EKG  Radiology No results found.  Procedures Procedures (including critical care time)  Medications Ordered in UC Medications - No data to display  Initial Impression / Assessment and Plan / UC Course  I have reviewed the triage vital signs and the nursing notes.  Pertinent labs & imaging results that were available during my care of the patient were reviewed by me and considered in my medical decision making (see chart for details).     1.  Left ear pain Exam in conjunction with elevated temperature suspicious for acute external otitis.  Will start Ciprodex drops.  Also component of eustachian tube dysfunction second to allergic rhinitis.  Will continue daily allergy medication and add Flonase daily.  Return precautions discussed, patient verbalized understanding and is agreeable to plan.  Final Clinical Impressions(s) / UC Diagnoses   Final diagnoses:  Ear pain, left  Allergic rhinitis, unspecified seasonality, unspecified trigger     Discharge Instructions     Use Flonase and eardrops as directed. May alternate between ibuprofen and Tylenol for pain relief. Return for worsening fever, throat pain, ear pain.    ED Prescriptions    Medication Sig Dispense Auth. Provider   ciprofloxacin-dexamethasone (CIPRODEX) OTIC suspension Place 4 drops into the left ear 2 (two) times daily. 7.5 mL Hall-Potvin, [place], PA-C   fluticasone (FLONASE) 50 MCG/ACT nasal spray Place 1 spray into both nostrils daily. 16 g Hall-Potvin, [place], PA-C     Controlled Substance Prescriptions [place] Controlled Substance Registry consulted? Not  Applicable   Quincy Sheehan, [place] 05/04/19 1347

## 2019-05-12 DIAGNOSIS — H10503 Unspecified blepharoconjunctivitis, bilateral: Secondary | ICD-10-CM | POA: Diagnosis not present

## 2019-05-12 DIAGNOSIS — H18452 Nodular corneal degeneration, left eye: Secondary | ICD-10-CM | POA: Diagnosis not present

## 2019-05-12 DIAGNOSIS — B0052 Herpesviral keratitis: Secondary | ICD-10-CM | POA: Diagnosis not present

## 2020-02-01 ENCOUNTER — Ambulatory Visit (INDEPENDENT_AMBULATORY_CARE_PROVIDER_SITE_OTHER): Payer: 59 | Admitting: Internal Medicine

## 2020-02-01 ENCOUNTER — Ambulatory Visit (INDEPENDENT_AMBULATORY_CARE_PROVIDER_SITE_OTHER): Payer: 59

## 2020-02-01 ENCOUNTER — Other Ambulatory Visit: Payer: Self-pay

## 2020-02-01 ENCOUNTER — Encounter: Payer: Self-pay | Admitting: Internal Medicine

## 2020-02-01 VITALS — BP 113/71 | HR 73 | Temp 97.3°F | Resp 17 | Ht 60.0 in | Wt 141.0 lb

## 2020-02-01 DIAGNOSIS — K219 Gastro-esophageal reflux disease without esophagitis: Secondary | ICD-10-CM | POA: Diagnosis not present

## 2020-02-01 DIAGNOSIS — Z7689 Persons encountering health services in other specified circumstances: Secondary | ICD-10-CM | POA: Diagnosis not present

## 2020-02-01 DIAGNOSIS — E063 Autoimmune thyroiditis: Secondary | ICD-10-CM

## 2020-02-01 DIAGNOSIS — R079 Chest pain, unspecified: Secondary | ICD-10-CM

## 2020-02-01 DIAGNOSIS — R0602 Shortness of breath: Secondary | ICD-10-CM | POA: Diagnosis not present

## 2020-02-01 DIAGNOSIS — Z8709 Personal history of other diseases of the respiratory system: Secondary | ICD-10-CM | POA: Diagnosis not present

## 2020-02-01 DIAGNOSIS — R05 Cough: Secondary | ICD-10-CM | POA: Diagnosis not present

## 2020-02-01 MED ORDER — OMEPRAZOLE 40 MG PO CPDR: 40.0000 mg | DELAYED_RELEASE_CAPSULE | Freq: Two times a day (BID) | ORAL | 3 refills | Status: DC

## 2020-02-01 MED FILL — OMEPRAZOLE 40 MG CPDR: 40 | 15 days supply | Qty: 30 | Fill #0

## 2020-02-01 NOTE — Progress Notes (Signed)
Subjective:    Keith Lawrence - 24 y.o. male MRN [id number]  Date of birth: [birth year]  HPI  Edmond Ginsberg Brathwaite is here to re-establish care.   Patient has been seen by GI in 2019. At that time, his prior EGD was normal. He was taking Omeprazole 20 mg daily. Plan was to maximize acid suppression by increasing to 40 mg BID for one month and then decreasing to 40 mg daily. Patient did not return to GI after these recommendations.   Patient is taking Famotidine 20 mg daily. He had no improvement in his symptoms with this. He reports regurgitation, dry cough (despite quitting smoking a few years ago).   He also endorses many complaints including chest pain, dyspnea with exertion and at rest. He reports both stabbing chest pain and chest tightness. He does have a history of asthma as a child. Does endorse some wheezing. Also feels cold and chill type sensation.    He is very concerned about cardiac issues. Wants cardiac evaluation. Reports significant family cardiac history. Father has HTN. Paternal grandfather has had three MIs, patient believes he had his first one in his 54-50s. Maternal grandfather is deceased, reports he had MI and heart complications. Paternal grandmother, deceased, has "heart complications" but he is not sure exact diagnosis. Maternal great uncle had sudden cardiac arrest in 74s. Unsure about family history of HOCM.   Health Maintenance:  Health Maintenance Due  Topic Date Due  . COVID-19 Vaccine (1) Never done    -  reports that he has quit smoking. His smoking use included cigarettes. He started smoking about 8 years ago. He smoked 0.00 packs per day. He has never used smokeless tobacco. - Review of Systems: Per HPI. - Past Medical History: Patient Active Problem List   Diagnosis Date Noted  . Gastroesophageal reflux disease 03/04/2018  . Thyroid activity decreased 01/31/2016  . 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 Parent-child conflict 29/52/8413  . Outbursts of  anger 02/23/2013  . Physical growth delay   . ADHD (attention deficit hyperactivity disorder)   . Goiter   . Hypothyroidism, acquired, autoimmune   . Thyroiditis, autoimmune   . Other specified acquired hypothyroidism 02/28/2011   - Medications: reviewed and updated   Objective:   Physical Exam BP 113/71   Pulse 73   Temp (!) 97.3 F (36.3 C) (Temporal)   Resp 17   Ht 5' (1.524 m)   Wt 141 lb (64 kg)   SpO2 98%   BMI 27.54 kg/m  Physical Exam  Constitutional: He is oriented to person, place, and time and well-developed, well-nourished, and in no distress. No distress.  HENT:  Head: Normocephalic and atraumatic.  Eyes: Conjunctivae and EOM are normal.  Cardiovascular: Normal rate, regular rhythm and normal heart sounds.  No murmur heard. Pulmonary/Chest: Effort normal and breath sounds normal. No respiratory distress.  Musculoskeletal:        General: Normal range of motion.  Neurological: He is alert and oriented to person, place, and time.  Skin: Skin is warm and dry. He is not diaphoretic.  Psychiatric: Affect and judgment normal.       Assessment & Plan:    1. Encounter to establish care  2. Gastroesophageal reflux disease, unspecified whether esophagitis present Some of patient's symptoms do sound related to GERD and he has a history of this as well. Will prescribe maximum dose PPI for one month and then decrease to once daily. May benefit from return to GI. Prior EGD normal.  -  omeprazole (PRILOSEC) 40 MG capsule; Take 1 capsule (40 mg total) by mouth in the morning and at bedtime.  Dispense: 30 capsule; Refill: 3  3. Chest pain, unspecified type Cardiac exam benign. EKG NSR without any ST segment changes. Cardiac etiology is less likely given age but given patient's concerns, extensive family cardiac history and his symptoms will place referral to cardiology. Will attempt to control GERD symptoms as well as this could be causing chest pain. Also working up asthma  as etiology of chest pain/chest tightness. Will obtain labs to rule out anemia, electrolyte abnormalities, and thyroid abnormality as etiology.  - Ambulatory referral to Cardiology - CBC - Comprehensive metabolic panel - EKG 12-Lead  4. Shortness of breath May be related to asthma as does report history of this. No evidence of exacerbation at today's visit. Will hold off on prescribing inhaler and send patient for PFTs with pulmonology. Will also obtain CXR especially given history of tobacco use. Cardiac etiology is less likely given age but given other symptomatology, am referring to cardiology as well.  - DG Chest 2 View; Future - Ambulatory referral to Pulmonology  5. Hypothyroidism, acquired, autoimmune Patient taking Synthroid 50 mcg daily. Reporting fatigue and diffuse chills/sensaiton of feeling cold. Will check TSH. Last evaluated in Nov 2019 and was WNL.  - TSH  6. History of asthma Not currently on any inhalers. Reports was prescribed rescue inhaler as a child but has not had for several years. Lungs currently clear and without evidence of exacerbation. Will refer to pulmonology for PFTs.  - Ambulatory referral to Pulmonology    Marcy Siren, D.O. 02/01/2020, 3:21 PM Primary [facility] at [facility]

## 2020-02-02 LAB — CBC
Hematocrit: 48.6 % (ref 37.5–51.0)
Hemoglobin: 16.5 g/dL (ref 13.0–17.7)
MCH: 30.3 pg (ref 26.6–33.0)
MCHC: 34 g/dL (ref 31.5–35.7)
MCV: 89 fL (ref 79–97)
Platelets: 281 10*3/uL (ref 150–450)
RBC: 5.44 x10E6/uL (ref 4.14–5.80)
RDW: 12.8 % (ref 11.6–15.4)
WBC: 5.8 10*3/uL (ref 3.4–10.8)

## 2020-02-02 LAB — COMPREHENSIVE METABOLIC PANEL
ALT: 21 IU/L (ref 0–44)
AST: 23 IU/L (ref 0–40)
Albumin/Globulin Ratio: 1.7 (ref 1.2–2.2)
Albumin: 5.1 g/dL (ref 4.1–5.2)
Alkaline Phosphatase: 65 IU/L (ref 39–117)
BUN/Creatinine Ratio: 8 — ABNORMAL LOW (ref 9–20)
BUN: 8 mg/dL (ref 6–20)
Bilirubin Total: 0.3 mg/dL (ref 0.0–1.2)
CO2: 25 mmol/L (ref 20–29)
Calcium: 10.3 mg/dL — ABNORMAL HIGH (ref 8.7–10.2)
Chloride: 102 mmol/L (ref 96–106)
Creatinine, Ser: 0.99 mg/dL (ref 0.76–1.27)
GFR calc Af Amer: 123 mL/min/{1.73_m2} (ref >59)
GFR calc non Af Amer: 106 mL/min/{1.73_m2} (ref >59)
Globulin, Total: 3 g/dL (ref 1.5–4.5)
Glucose: 84 mg/dL (ref 65–99)
Potassium: 4.5 mmol/L (ref 3.5–5.2)
Sodium: 142 mmol/L (ref 134–144)
Total Protein: 8.1 g/dL (ref 6.0–8.5)

## 2020-02-02 LAB — TSH: TSH: 2.82 u[IU]/mL (ref 0.450–4.500)

## 2020-02-02 NOTE — Progress Notes (Signed)
Patient notified of results & recommendations. Expressed understanding.

## 2020-02-16 ENCOUNTER — Ambulatory Visit: Payer: 59 | Admitting: Cardiology

## 2020-02-28 ENCOUNTER — Ambulatory Visit: Payer: 59 | Admitting: Cardiology

## 2020-02-28 MED FILL — OMEPRAZOLE 40 MG CPDR: 40 | 30 days supply | Qty: 60 | Fill #0

## 2020-03-09 ENCOUNTER — Ambulatory Visit (INDEPENDENT_AMBULATORY_CARE_PROVIDER_SITE_OTHER): Payer: 59 | Admitting: Internal Medicine

## 2020-03-09 ENCOUNTER — Other Ambulatory Visit: Payer: Self-pay

## 2020-03-09 ENCOUNTER — Encounter: Payer: Self-pay | Admitting: Internal Medicine

## 2020-03-09 VITALS — BP 110/80 | HR 108 | Temp 99.0°F | Ht 64.0 in | Wt 146.0 lb

## 2020-03-09 DIAGNOSIS — R5383 Other fatigue: Secondary | ICD-10-CM

## 2020-03-09 DIAGNOSIS — R0602 Shortness of breath: Secondary | ICD-10-CM

## 2020-03-09 DIAGNOSIS — R059 Cough, unspecified: Secondary | ICD-10-CM

## 2020-03-09 DIAGNOSIS — R05 Cough: Secondary | ICD-10-CM

## 2020-03-09 DIAGNOSIS — J029 Acute pharyngitis, unspecified: Secondary | ICD-10-CM

## 2020-03-09 DIAGNOSIS — R0600 Dyspnea, unspecified: Secondary | ICD-10-CM

## 2020-03-09 LAB — CBC WITH DIFFERENTIAL/PLATELET
Basophils Absolute: 0 10*3/uL (ref 0.0–0.1)
Basophils Relative: 0.5 % (ref 0.0–3.0)
Eosinophils Absolute: 0 10*3/uL (ref 0.0–0.7)
Eosinophils Relative: 0.9 % (ref 0.0–5.0)
HCT: 43.9 % (ref 39.0–52.0)
Hemoglobin: 15.4 g/dL (ref 13.0–17.0)
Lymphocytes Relative: 29 % (ref 12.0–46.0)
Lymphs Abs: 1.5 10*3/uL (ref 0.7–4.0)
MCHC: 35 g/dL (ref 30.0–36.0)
MCV: 88.9 fl (ref 78.0–100.0)
Monocytes Absolute: 0.4 10*3/uL (ref 0.1–1.0)
Monocytes Relative: 8.3 % (ref 3.0–12.0)
Neutro Abs: 3.3 10*3/uL (ref 1.4–7.7)
Neutrophils Relative %: 61.3 % (ref 43.0–77.0)
Platelets: 214 10*3/uL (ref 150.0–400.0)
RBC: 4.94 Mil/uL (ref 4.22–5.81)
RDW: 12.9 % (ref 11.5–15.5)
WBC: 5.3 10*3/uL (ref 4.0–10.5)

## 2020-03-09 NOTE — Addendum Note (Signed)
Addended by: Delrae Rend on: 03/09/2020 03:02 PM   Modules accepted: Orders

## 2020-03-09 NOTE — Progress Notes (Signed)
OV 03/09/2020  Subjective:  Patient ID: Keith Lawrence, male , DOB: Nov 18, 1995 , age 24 y.o. , MRN: [id number] , ADDRESS: [place] [address]  PCP Nicolette Bang, DO   03/09/2020 -   Chief Complaint  Patient presents with  . Consult    asthma as a kid.  recent GERD diagnosis.  Cough with yellow/green phlem.     HPI Keith Lawrence 24 y.o. -referred by primary care physician.  He is here because of 4-year history of symptoms of shortness of breath fatigue and cough.  He says he had multiple visits to the emergency department a few years ago with a diagnosis of acid reflux subsequently made in 2018 or 2019.  However acid reflux treatment is really not helped his symptoms.  He continues to have this.  There is also associated fatigue.  Unclear if necessarily nocturnal symptoms.  His blood eosinophil count in 2019 was normal.  He is currently unemployed.  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  In the last few days he did some yard work and was exposed to tree he is having some low-grade fever today.  He had some sore throat this morning or yesterday.  He also had some green phlegm yesterday.  He did feel feverish a little bit but he attributes this to hot weather.  There is no Covid exposure but at the same time he has not had a Covid vaccine.   Results for ARVEL, OQUINN (MRN [id number]) as of 03/09/2020 14:34  Ref. Range 08/19/2018 15:36  EOS (ABSOLUTE) Latest Ref Range: 0.0 - 0.4 x10E3/uL 0.1    ROS - per HPI     has a past medical history of Abdominal pain, ADHD (attention deficit hyperactivity disorder), Asthma, Fatigue, Goiter, Hypothyroidism, acquired, autoimmune, Iron excess, Isosexual precocity, Nausea and vomiting, Physical growth delay, SOB (shortness of breath) on exertion, and Thyroiditis, autoimmune.   reports that he quit smoking about 6 years ago. His smoking use included cigarettes. He started smoking about 8  years ago. He has a 7.00 pack-year smoking history. He has never used smokeless tobacco.  Past Surgical History:  Procedure Laterality Date  . CHALAZION EXCISION    . ESOPHAGOGASTRODUODENOSCOPY  03/19/2016   per Dr. Loletha Carrow, normal   . FRENULECTOMY, LINGUAL    . TONSILLECTOMY AND ADENOIDECTOMY      No Known Allergies  Immunization History  Administered Date(s) Administered  . DTaP 09/03/1995, 11/03/1995, 01/14/1996, 05/23/1997, 03/26/2000  . HPV Quadrivalent 06/11/2012, 09/28/2012  . Hepatitis A 07/10/2007, 06/11/2012  . Hepatitis B 03/20/95, 08/01/1995, 03/24/1996  . HiB (PRP-OMP) 09/03/1995, 10/28/1995, 01/14/1996, 05/23/1997  . IPV 09/03/1995, 10/28/1995, 01/14/1996, 03/26/2000  . MMR 10/26/1996, 03/26/2000  . Meningococcal Conjugate 07/10/2007  . Meningococcal Polysaccharide 09/28/2012  . Tdap 06/11/2012    Family History  Problem Relation Age of Onset  . Thyroid disease Maternal Grandmother   . Heart disease Maternal Grandmother   . Thyroid disease Paternal Aunt   . Heart disease Paternal Grandfather   . Diabetes Neg Hx   . Colon cancer Neg Hx      Current Outpatient Medications:  .  Ascorbic Acid (VITAMIN C) 1000 MG tablet, Take 1,000 mg by mouth daily., Disp: , Rfl:  .  B Complex-Folic Acid (B COMPLEX PLUS PO), Take 1 capsule by mouth daily., Disp: , Rfl:  .  levothyroxine (SYNTHROID) 50 MCG tablet, Take 50 mcg by mouth daily before breakfast., Disp: , Rfl:  .  omeprazole (PRILOSEC) 40 MG capsule, Take 1 capsule (40 mg total) by mouth in the morning and at bedtime., Disp: 30 capsule, Rfl: 3      Objective:   Vitals:   03/09/20 1430  BP: 110/80  Pulse: (!) 108  Temp: 99 F (37.2 C)  TempSrc: Oral  SpO2: 97%  Weight: 146 lb (66.2 kg)  Height: 5' 4" (1.626 m)    Estimated body mass index is 25.06 kg/m as calculated from the following:   Height as of this encounter: 5' 4" (1.626 m).   Weight as of this encounter: 146 lb (66.2  kg).  _0 @  [employer]   03/09/20 1430  Weight: 146 lb (66.2 kg)     Physical Exam  General Appearance:    Alert, cooperative, no distress, appears stated age - yes , Deconditioned looking - no , OBESE  - no, Sitting on Wheelchair -  no  Head:    Normocephalic, without obvious abnormality, atraumatic  Eyes:    PERRL, conjunctiva/corneas clear,  Ears:    Normal TM's and external ear canals, both ears  Nose:   Nares normal, septum midline, mucosa normal, no drainage    or sinus tenderness. OXYGEN ON  - no . Patient is @ no   Throat:   Lips, mucosa, and tongue normal; teeth and gums normal. Cyanosis on lips - ra  Neck:   Supple, symmetrical, trachea midline, no adenopathy;    thyroid:  no enlargement/tenderness/nodules; no carotid   bruit or JVD  Back:     Symmetric, no curvature, ROM normal, no CVA tenderness  Lungs:     Distress - no , Wheeze no, Barrell Chest - no, Purse lip breathing - no, Crackles - no   Chest Wall:    No tenderness or deformity.    Heart:    Regular rate and rhythm, S1 and S2 normal, no rub   or gallop, Murmur - no  Breast Exam:    NOT DONE  Abdomen:     Soft, non-tender, bowel sounds active all four quadrants,    no masses, no organomegaly. Visceral obesity - no  Genitalia:   NOT DONE  Rectal:   NOT DONE  Extremities:   Extremities - normal, Has Cane - no, Clubbing - no, Edema - no  Pulses:   2+ and symmetric all extremities  Skin:   Stigmata of Connective Tissue Disease - no  Lymph nodes:   Cervical, supraclavicular, and axillary nodes normal  Psychiatric:  Neurologic:   Pleasant - yes, Anxious - no, Flat affect - no  CAm-ICU - neg, Alert and Oriented x 3 - yes, Moves all 4s - yes, Speech - normal, Cognition - intact           Assessment:       ICD-10-CM   1. Cough  R05   2. Dyspnea, unspecified type  R06.00   3. Sore throat  J02.9   4. Fatigue, unspecified type  R53.83    Symptoms seem pretty classic for asthma.  Although acute  situation and the lack of Covid vaccine COVID-19 needs to be ruled out    Plan:     Patient Instructions     ICD-10-CM   1. Cough  R05   2. Dyspnea, unspecified type  R06.00   3. Sore throat  J02.9   4. Fatigue, unspecified type  R53.83    Do covid pcr test - 03/09/2020   Do blood cbc with diff, IgE, blood allergy  panel 03/09/2020  Do HRCT supine and prone next several days if covid negative  Do full PFT next few weeks  Do exhaled nitric oxide test next few weeks  Followup - next few weeks with app to discuss test results and diagnose symptoms based on test result     SIGNATURE    Dr. Brand Males, M.D., F.C.C.P,  Pulmonary and Critical Care Medicine Staff Physician, [place] Director - Interstitial Lung Disease  Program  [facility] [place], [place], [address]  Pager: [phone], If no answer or between  15:00h - 7:00h: call [phone] Telephone: [phone]  2:53 PM 03/09/2020

## 2020-03-09 NOTE — Patient Instructions (Signed)
ICD-10-CM   1. Cough  R05   2. Dyspnea, unspecified type  R06.00   3. Sore throat  J02.9   4. Fatigue, unspecified type  R53.83    Do covid pcr test - 03/09/2020   Do blood cbc with diff, IgE, blood allergy panel 03/09/2020  Do HRCT supine and prone next several days if covid negative  Do full PFT next few weeks  Do exhaled nitric oxide test next few weeks  Followup - next few weeks with app to discuss test results and diagnose symptoms based on test result

## 2020-03-09 NOTE — Addendum Note (Signed)
Addended by: Demetrio Lapping E on: 03/09/2020 03:07 PM   Modules accepted: Orders

## 2020-03-10 LAB — IGE: IgE (Immunoglobulin E), Serum: 94 kU/L (ref <=114)

## 2020-03-12 LAB — ALLERGEN STINGING INSECT PANEL
Honeybee IgE: <0.1 kU/L
Hornet, White Face, IgE: <0.1 kU/L
Hornet, Yellow, IgE: <0.1 kU/L
Paper Wasp IgE: <0.1 kU/L
Yellow Jacket, IgE: <0.1 kU/L

## 2020-03-14 DIAGNOSIS — Z20822 Contact with and (suspected) exposure to covid-19: Secondary | ICD-10-CM | POA: Diagnosis not present

## 2020-03-15 ENCOUNTER — Ambulatory Visit: Payer: 59 | Admitting: Cardiology

## 2020-03-15 ENCOUNTER — Telehealth: Payer: Self-pay | Admitting: Adult Health

## 2020-03-15 NOTE — Telephone Encounter (Signed)
Patient had a HRCT ordered on 03/09/2020 and is calling to get his scan scheduled.

## 2020-03-15 NOTE — Telephone Encounter (Signed)
I will call and schedule this ct and call the patient

## 2020-03-21 ENCOUNTER — Ambulatory Visit (HOSPITAL_BASED_OUTPATIENT_CLINIC_OR_DEPARTMENT_OTHER)
Admission: RE | Admit: 2020-03-21 | Discharge: 2020-03-21 | Disposition: A | Discharge status: Home / Self Care | Payer: 59 | Source: Ambulatory Visit | Attending: Internal Medicine | Admitting: Internal Medicine

## 2020-03-21 ENCOUNTER — Other Ambulatory Visit: Payer: Self-pay

## 2020-03-21 DIAGNOSIS — J45909 Unspecified asthma, uncomplicated: Secondary | ICD-10-CM | POA: Diagnosis not present

## 2020-03-21 DIAGNOSIS — R0602 Shortness of breath: Secondary | ICD-10-CM | POA: Diagnosis not present

## 2020-04-03 ENCOUNTER — Ambulatory Visit: Payer: Self-pay | Admitting: *Deleted

## 2020-04-03 NOTE — Telephone Encounter (Signed)
Call received with c/o with GERD and progressive fatigue. C/o increasing weakness with exertion, bones ache, chest pain and SOB with exertion but not significant at this time. Patient reports "no one can figure out what is wrong with me". Progressive symptoms of fatigue and weakness since 2016. Recent dx of GERD. New med digestive enzymes with some relief of symptoms. Denies fever, cold sweats, pr chills  now. Can eat and drink, denies N/V/D. Care advise given and advise patient to go to ED if chest pain and SOB symptoms worsen. Patient reports he does not want to go to ED due to financial difficulties. Advised to call PCP within 3 days due to increase fatigue. Patient verbalized understanding of care advise.   Reason for Disposition . [1] Fatigue (i.e., tires easily, decreased energy) AND [2] persists > 1 week  Answer Assessment - Initial Assessment Questions 1. DESCRIPTION: "Describe how you are feeling."     Weak and difficulty breathing when gets hot and progressive since 2016 2. SEVERITY: "How bad is it?"  "Can you stand and walk?"   - MILD - Feels weak or tired, but does not interfere with work, school or normal activities   - MODERATE - Able to stand and walk; weakness interferes with work, school, or normal activities   - SEVERE - Unable to stand or walk     moderate 3. ONSET:  "When did the weakness begin?"     Progressive since 2016 4. CAUSE: "What do you think is causing the weakness?"     "black mold"  5. MEDICINES: "Have you recently started a new medicine or had a change in the amount of a medicine?"     Digestive enzymes but still remains weak 6. OTHER SYMPTOMS: "Do you have any other symptoms?" (e.g., chest pain, fever, cough, SOB, vomiting, diarrhea, bleeding, other areas of pain)     Chest pain with SOB, dry cough cold sweats several months ago.  Protocols used: WEAKNESS (GENERALIZED) AND FATIGUE-A-AH

## 2020-04-10 NOTE — Progress Notes (Deleted)
Cardiology Office Note:    Date:  04/10/2020   ID:  Keith Lawrence, DOB 03-13-1996, MRN [id number]  PCP:  Keith Market, DO  Cardiologist:  No primary care provider on file.  Electrophysiologist:  None   Referring MD: Keith Lawrence*   No chief complaint on file. ***  History of Present Illness:    Keith Lawrence is a 24 y.o. male with a hx of hypothyroidism, ADHD, GERD, asthma, tobacco use who is referred by Dr. Earlene Lawrence for evaluation of chest pain.  Past Medical History:  Diagnosis Date  . Abdominal pain    all over pain  . ADHD (attention deficit hyperactivity disorder)   . Asthma   . Fatigue   . Goiter   . Hypothyroidism, acquired, autoimmune   . Iron excess   . Isosexual precocity   . Nausea and vomiting   . Physical growth delay   . SOB (shortness of breath) on exertion   . Thyroiditis, autoimmune     Past Surgical History:  Procedure Laterality Date  . CHALAZION EXCISION    . ESOPHAGOGASTRODUODENOSCOPY  03/19/2016   per Dr. Myrtie Lawrence, normal   . FRENULECTOMY, LINGUAL    . TONSILLECTOMY AND ADENOIDECTOMY      Current Medications: No outpatient medications have been marked as taking for the 04/11/20 encounter (Appointment) with Keith Ishikawa, MD.     Allergies:   Patient has no known allergies.   Social History   Socioeconomic History  . Marital status: Single    Spouse name: Not on file  . Number of children: Not on file  . Years of education: Not on file  . Highest education level: Not on file  Occupational History  . Not on file  Tobacco Use  . Smoking status: Former Smoker    Packs/day: 1.00    Years: 7.00    Pack years: 7.00    Types: Cigarettes    Start date: 01/11/2012    Quit date: 03/09/2014    Years since quitting: 6.0  . Smokeless tobacco: Never Used  Substance and Sexual Activity  . Alcohol use: Yes    Alcohol/week: 0.0 standard drinks  . Drug use: Yes    Types: "Crack" cocaine, Other-see  comments, Marijuana    Comment: Stopped 5 months ago  . Sexual activity: Yes  Other Topics Concern  . Not on file  Social History Narrative   Lives with foster parents. Mom still has guardianship.  [employer].    Social Determinants of Health   Financial Resource Strain:   . Difficulty of Paying Living Expenses:   Food Insecurity:   . Worried About Programme researcher, broadcasting/film/video in the Last Year:   . Barista in the Last Year:   Transportation Needs:   . Freight forwarder (Medical):   Marland Kitchen Lack of Transportation (Non-Medical):   Physical Activity:   . Days of Exercise per Week:   . Minutes of Exercise per Session:   Stress:   . Feeling of Stress :   Social Connections:   . Frequency of Communication with Friends and Family:   . Frequency of Social Gatherings with Friends and Family:   . Attends Religious Services:   . Active Member of Clubs or Organizations:   . Attends Banker Meetings:   Marland Kitchen Marital Status:      Family History: The patient's ***family history includes Heart disease in his maternal grandmother and paternal grandfather; Thyroid disease  in his maternal grandmother and paternal aunt. There is no history of Diabetes or Colon cancer.  ROS:   Please see the history of present illness.    *** All other systems reviewed and are negative.  EKGs/Labs/Other Studies Reviewed:    The following studies were reviewed today: ***  EKG:  EKG is *** ordered today.  The ekg ordered today demonstrates ***  Recent Labs: 02/01/2020: ALT 21; BUN 8; Creatinine, Ser 0.99; Potassium 4.5; Sodium 142; TSH 2.820 03/09/2020: Hemoglobin 15.4; Platelets 214.0  Recent Lipid Panel No results found for: CHOL, TRIG, HDL, CHOLHDL, VLDL, LDLCALC, LDLDIRECT  Physical Exam:    VS:  There were no vitals taken for this visit.    Wt Readings from Last 3 Encounters:  03/09/20 146 lb (66.2 kg)  02/01/20 141 lb (64 kg)  08/27/18 151 lb 6.4 oz (68.7 kg)     GEN: ***  Well nourished, well developed in no acute distress HEENT: Normal NECK: No JVD; No carotid bruits LYMPHATICS: No lymphadenopathy CARDIAC: ***RRR, no murmurs, rubs, gallops RESPIRATORY:  Clear to auscultation without rales, wheezing or rhonchi  ABDOMEN: Soft, non-tender, non-distended MUSCULOSKELETAL:  No edema; No deformity  SKIN: Warm and dry NEUROLOGIC:  Alert and oriented x 3 PSYCHIATRIC:  Normal affect   ASSESSMENT:    No diagnosis found. PLAN:    Chest pain:  Medication Adjustments/Labs and Tests Ordered: Current medicines are reviewed at length with the patient today.  Concerns regarding medicines are outlined above.  No orders of the defined types were placed in this encounter.  No orders of the defined types were placed in this encounter.   There are no Patient Instructions on file for this visit.   Signed, Keith Ishikawa, MD  04/10/2020 9:07 PM    [facility]

## 2020-04-11 ENCOUNTER — Ambulatory Visit: Payer: 59 | Admitting: Cardiology

## 2020-04-19 MED FILL — OMEPRAZOLE 40 MG CPDR: 40 | 30 days supply | Qty: 60 | Fill #1

## 2020-04-22 ENCOUNTER — Other Ambulatory Visit (HOSPITAL_COMMUNITY): Payer: 59

## 2020-04-26 ENCOUNTER — Ambulatory Visit: Payer: 59 | Admitting: Adult Health

## 2020-04-26 ENCOUNTER — Ambulatory Visit: Payer: 59 | Admitting: Primary Care

## 2020-05-03 NOTE — Progress Notes (Signed)
Cardiology Office Note:   Date:  05/05/2020  NAME:  Keith Lawrence    MRN: [id number] DOB:  12-20-1995   PCP:  Arvilla Market, DO  Cardiologist:  No primary care provider on file.  Electrophysiologist:  None   Referring MD: Leary Roca*   Chief Complaint  Patient presents with  . Chest Pain   History of Present Illness:   Keith Lawrence is a 24 y.o. male with a hx of GERD who is being seen today for the evaluation of chest pain at the request of Arvilla Market, DO. History of asthma. Recent chest CT without calcifications.  He reports since 2016 he had a constellation of symptoms which include weakness and fatigue.  He also reports intermittent burning in his chest.  Has been treated for acid reflux but symptoms have not improved.  He has had an EGD which was normal.  He reports he also is just constantly tired.  He reports numbness in his feet and hands.  He reports intermittent episodes of neck pain as well as chest pain.  Symptoms appear to occur at any time.  He also reports that he is short of breath with activity.  Recent evaluation from pulmonary shows no evidence of lung disease on CT scan.  He is awaiting pulmonary function testing.  He does have a history of hypothyroidism and he takes Synthroid.  He also continues to take Prilosec.  His EKG today is normal.  He has no evidence of ischemic changes or evidence of prior infarction.  Cardiovascular exam is benign.  He is a former smoker but quit.  He does not use any drugs.  He is also quit alcohol.  He reports he is not working currently and lives with his grandparents.  We did talk about possible depression given his multiple complaints with a lack of a unifying diagnosis.  Past Medical History: Past Medical History:  Diagnosis Date  . Abdominal pain    all over pain  . ADHD (attention deficit hyperactivity disorder)   . Asthma   . Fatigue   . Goiter   . Hypothyroidism, acquired,  autoimmune   . Iron excess   . Isosexual precocity   . Nausea and vomiting   . Physical growth delay   . SOB (shortness of breath) on exertion   . Thyroiditis, autoimmune     Past Surgical History: Past Surgical History:  Procedure Laterality Date  . CHALAZION EXCISION    . ESOPHAGOGASTRODUODENOSCOPY  03/19/2016   per Dr. Myrtie Neither, normal   . FRENULECTOMY, LINGUAL    . TONSILLECTOMY AND ADENOIDECTOMY      Current Medications: Current Meds  Medication Sig  . Ascorbic Acid (VITAMIN C) 1000 MG tablet Take 1,000 mg by mouth daily.  . B Complex-Folic Acid (B COMPLEX PLUS PO) Take 1 capsule by mouth daily.  Marland Kitchen levothyroxine (SYNTHROID) 50 MCG tablet Take 50 mcg by mouth daily before breakfast.  . omeprazole (PRILOSEC) 40 MG capsule Take 1 capsule (40 mg total) by mouth in the morning and at bedtime.     Allergies:    Patient has no known allergies.   Social History: Social History   Socioeconomic History  . Marital status: Single    Spouse name: Not on file  . Number of children: Not on file  . Years of education: Not on file  . Highest education level: Not on file  Occupational History  . Not on file  Tobacco Use  . Smoking  status: Former Smoker    Packs/day: 1.00    Years: 7.00    Pack years: 7.00    Types: Cigarettes    Start date: 01/11/2012    Quit date: 03/09/2014    Years since quitting: 6.1  . Smokeless tobacco: Never Used  Substance and Sexual Activity  . Alcohol use: Yes    Alcohol/week: 0.0 standard drinks  . Drug use: Yes    Types: Other-see comments, Marijuana    Comment: Stopped 5 months ago  . Sexual activity: Yes  Other Topics Concern  . Not on file  Social History Narrative   Lives with foster parents. Mom still has guardianship.  [employer].    Social Determinants of Health   Financial Resource Strain:   . Difficulty of Paying Living Expenses:   Food Insecurity:   . Worried About Programme researcher, broadcasting/film/video in the Last Year:   . Engineer, site in the Last Year:   Transportation Needs:   . Freight forwarder (Medical):   Marland Kitchen Lack of Transportation (Non-Medical):   Physical Activity:   . Days of Exercise per Week:   . Minutes of Exercise per Session:   Stress:   . Feeling of Stress :   Social Connections:   . Frequency of Communication with Friends and Family:   . Frequency of Social Gatherings with Friends and Family:   . Attends Religious Services:   . Active Member of Clubs or Organizations:   . Attends Banker Meetings:   Marland Kitchen Marital Status:      Family History: The patient's family history includes Heart disease in his maternal grandmother and paternal grandfather; Stroke in his paternal uncle; Thyroid disease in his maternal grandmother and paternal aunt. There is no history of Diabetes or Colon cancer.  ROS:   All other ROS reviewed and negative. Pertinent positives noted in the HPI.     EKGs/Labs/Other Studies Reviewed:   The following studies were personally reviewed by me today:  EKG:  EKG is ordered today.  The ekg ordered today demonstrates normal sinus rhythm, heart rate 73, no acute ST-T changes, no evidence of prior infarction., and was personally reviewed by me.   Recent Labs: 02/01/2020: ALT 21; BUN 8; Creatinine, Ser 0.99; Potassium 4.5; Sodium 142; TSH 2.820 03/09/2020: Hemoglobin 15.4; Platelets 214.0   Recent Lipid Panel No results found for: CHOL, TRIG, HDL, CHOLHDL, VLDL, LDLCALC, LDLDIRECT  Physical Exam:   VS:  BP (!) 118/64   Pulse 73   Ht 5\' 4"  (1.626 m)   Wt 144 lb 12.8 oz (65.7 kg)   SpO2 98%   BMI 24.85 kg/m    Wt Readings from Last 3 Encounters:  05/05/20 144 lb 12.8 oz (65.7 kg)  03/09/20 146 lb (66.2 kg)  02/01/20 141 lb (64 kg)    General: Well nourished, well developed, in no acute distress Heart: Atraumatic, normal size  Eyes: PEERLA, EOMI  Neck: Supple, no JVD Endocrine: No thryomegaly Cardiac: Normal S1, S2; RRR; no murmurs, rubs, or gallops Lungs:  Clear to auscultation bilaterally, no wheezing, rhonchi or rales  Abd: Soft, nontender, no hepatomegaly  Ext: No edema, pulses 2+ Musculoskeletal: No deformities, BUE and BLE strength normal and equal Skin: Warm and dry, no rashes   Neuro: Alert and oriented to person, place, time, and situation, CNII-XII grossly intact, no focal deficits  Psych: Normal mood and affect   ASSESSMENT:   Macallister Ashmead Arnell is a 24  y.o. male who presents for the following: 1. Chest pain, unspecified type     PLAN:   1. Chest pain, unspecified type -He presents with a constellation of symptoms including weakness, fatigue, intermittent numbness in his hands, neck pain, chest pain.  He also reports abdominal symptoms.  He also gets quite short of breath at times.  He reports he just has no energy.  Thyroid studies are normal.  He has had multiple work-up for acid reflux including a normal EGD in the past.  He has been evaluated by pulmonary and had a normal chest CT.  PFTs are pending.  His EKG is normal with no acute ischemic changes.  He has no symptoms suggestive of angina.  He is quite young for cardiovascular disease.  Overall, I have a low suspicion for cardiovascular disease.  I am more concerned he has possibly depressed.  He has multiple symptoms including multiple organs which do not seem to add up.  I have asked him to reach back out to his primary care physician to possibly worked up for depression.  I recommended no further cardiac testing at this time.  We will see him as needed.  Disposition: Return if symptoms worsen or fail to improve.  Medication Adjustments/Labs and Tests Ordered: Current medicines are reviewed at length with the patient today.  Concerns regarding medicines are outlined above.  Orders Placed This Encounter  Procedures  . EKG 12-Lead   No orders of the defined types were placed in this encounter.   Patient Instructions  Medication Instructions:  The current medical regimen  is effective;  continue present plan and medications.  *If you need a refill on your cardiac medications before your next appointment, please call your pharmacy*   Follow-Up: At [facility], you and your health needs are our priority.  As part of our continuing mission to provide you with exceptional heart care, we have created designated Provider Care Teams.  These Care Teams include your primary Cardiologist (physician) and Advanced Practice Providers (APPs -  Physician Assistants and Nurse Practitioners) who all work together to provide you with the care you need, when you need it.  We recommend signing up for the patient portal called "MyChart".  Sign up information is provided on this After Visit Summary.  MyChart is used to connect with patients for Virtual Visits (Telemedicine).  Patients are able to view lab/test results, encounter notes, upcoming appointments, etc.  Non-urgent messages can be sent to your provider as well.   To learn more about what you can do with MyChart, go to [email or web address].    Your next appointment:   As needed  The format for your next appointment:   In Person  Provider:   Lennie Odor, MD      Signed, Lenna Gilford. Flora Lipps, MD [facility]  [address], [address] [place], [place] [address] [phone]  05/05/2020 4:16 PM

## 2020-05-05 ENCOUNTER — Other Ambulatory Visit: Payer: Self-pay

## 2020-05-05 ENCOUNTER — Encounter: Payer: Self-pay | Admitting: Cardiovascular Disease

## 2020-05-05 ENCOUNTER — Ambulatory Visit (INDEPENDENT_AMBULATORY_CARE_PROVIDER_SITE_OTHER): Payer: 59 | Admitting: Cardiovascular Disease

## 2020-05-05 VITALS — BP 118/64 | HR 73 | Ht 64.0 in | Wt 144.8 lb

## 2020-05-05 DIAGNOSIS — R079 Chest pain, unspecified: Secondary | ICD-10-CM | POA: Diagnosis not present

## 2020-05-05 NOTE — Patient Instructions (Signed)
Medication Instructions:  The current medical regimen is effective;  continue present plan and medications.  *If you need a refill on your cardiac medications before your next appointment, please call your pharmacy*    Follow-Up: At [facility], you and your health needs are our priority.  As part of our continuing mission to provide you with exceptional heart care, we have created designated Provider Care Teams.  These Care Teams include your primary Cardiologist (physician) and Advanced Practice Providers (APPs -  Physician Assistants and Nurse Practitioners) who all work together to provide you with the care you need, when you need it.  We recommend signing up for the patient portal called "MyChart".  Sign up information is provided on this After Visit Summary.  MyChart is used to connect with patients for Virtual Visits (Telemedicine).  Patients are able to view lab/test results, encounter notes, upcoming appointments, etc.  Non-urgent messages can be sent to your provider as well.   To learn more about what you can do with MyChart, go to [email or web address].    Your next appointment:   As needed  The format for your next appointment:   In Person  Provider:   Halbur O'Neal, MD      

## 2020-05-16 ENCOUNTER — Telehealth: Payer: Self-pay | Admitting: Internal Medicine

## 2020-05-16 NOTE — Telephone Encounter (Signed)
Patient calling or pulmonary doctor referral. Plan is for him to find a doctor and have them request his records. Patient verbalized understanding of plan.

## 2020-05-18 ENCOUNTER — Other Ambulatory Visit (HOSPITAL_COMMUNITY): Payer: 59

## 2020-05-18 DIAGNOSIS — R42 Dizziness and giddiness: Secondary | ICD-10-CM | POA: Diagnosis not present

## 2020-05-18 DIAGNOSIS — K219 Gastro-esophageal reflux disease without esophagitis: Secondary | ICD-10-CM | POA: Diagnosis not present

## 2020-05-18 DIAGNOSIS — F419 Anxiety disorder, unspecified: Secondary | ICD-10-CM | POA: Diagnosis not present

## 2020-05-18 DIAGNOSIS — E039 Hypothyroidism, unspecified: Secondary | ICD-10-CM | POA: Diagnosis not present

## 2020-05-21 ENCOUNTER — Telehealth: Payer: Self-pay | Admitting: Internal Medicine

## 2020-05-21 NOTE — Telephone Encounter (Signed)
  Blood work and ct chest fro juine 2021 are normal. My apologies for delay.   He was supposed tog et PFT and feno and see me  - please facilitate. First avail is fine. Tele or video also fne   Results for Pantoja, Keith Lawrence" (MRN [id number]) as of 05/21/2020 22:10  Ref. Range 03/09/2020 15:08  Class Description Allergens Unknown Comment  Hornet, White Face, IgE Latest Ref Range: Class 0 kU/L <0.10  Yellow Jacket, IgE Latest Ref Range: Class 0 kU/L <0.10  Paper Wasp IgE Latest Ref Range: Class 0 kU/L <0.10  Hornet, Yellow, IgE Latest Ref Range: Class 0 kU/L <0.10  Honeybee IgE Latest Ref Range: Class 0 kU/L <0.10  IgE (Immunoglobulin E), Serum Latest Ref Range: <OR=114 kU/L 94    Lungs/Pleura: No pneumothorax. No pleural effusion. No acute consolidative airspace disease, lung masses or significant pulmonary nodules. No significant air trapping or evidence of tracheobronchomalacia on the expiration sequence. No significant regions of subpleural reticulation, ground-glass attenuation, parenchymal banding, traction bronchiectasis, architectural distortion or frank honeycombing. Diffuse bronchial wall thickening.  Upper abdomen: No acute abnormality.  Musculoskeletal:  No aggressive appearing focal osseous lesions.  IMPRESSION: 1. No evidence of interstitial lung disease. 2. Nonspecific diffuse bronchial wall thickening, compatible with reported history of reactive airways disease.   Electronically Signed   By: Delbert Phenix M.D.   On: 03/21/2020 16:06

## 2020-05-22 ENCOUNTER — Ambulatory Visit: Payer: 59 | Admitting: Primary Care

## 2020-05-23 DIAGNOSIS — K219 Gastro-esophageal reflux disease without esophagitis: Secondary | ICD-10-CM | POA: Diagnosis not present

## 2020-05-24 NOTE — Telephone Encounter (Signed)
There is a phone message dated 05/16/20 which states that pt has moved to [place]. Nothing further needed.

## 2020-05-30 DIAGNOSIS — R5383 Other fatigue: Secondary | ICD-10-CM | POA: Diagnosis not present

## 2020-05-30 DIAGNOSIS — M791 Myalgia, unspecified site: Secondary | ICD-10-CM | POA: Diagnosis not present

## 2020-05-30 DIAGNOSIS — R632 Polyphagia: Secondary | ICD-10-CM | POA: Diagnosis not present

## 2020-05-30 DIAGNOSIS — K219 Gastro-esophageal reflux disease without esophagitis: Secondary | ICD-10-CM | POA: Diagnosis not present

## 2020-05-30 DIAGNOSIS — M2550 Pain in unspecified joint: Secondary | ICD-10-CM | POA: Diagnosis not present

## 2020-05-30 DIAGNOSIS — R209 Unspecified disturbances of skin sensation: Secondary | ICD-10-CM | POA: Diagnosis not present

## 2020-05-30 DIAGNOSIS — R519 Headache, unspecified: Secondary | ICD-10-CM | POA: Diagnosis not present

## 2020-05-30 DIAGNOSIS — E559 Vitamin D deficiency, unspecified: Secondary | ICD-10-CM | POA: Diagnosis not present

## 2020-05-30 DIAGNOSIS — E039 Hypothyroidism, unspecified: Secondary | ICD-10-CM | POA: Diagnosis not present

## 2020-06-08 ENCOUNTER — Ambulatory Visit: Payer: 59 | Admitting: Cardiology

## 2020-06-18 DIAGNOSIS — R070 Pain in throat: Secondary | ICD-10-CM | POA: Diagnosis not present

## 2020-06-18 DIAGNOSIS — R05 Cough: Secondary | ICD-10-CM | POA: Diagnosis not present

## 2020-06-18 DIAGNOSIS — H9209 Otalgia, unspecified ear: Secondary | ICD-10-CM | POA: Diagnosis not present

## 2020-06-18 DIAGNOSIS — E039 Hypothyroidism, unspecified: Secondary | ICD-10-CM | POA: Diagnosis not present

## 2020-06-18 DIAGNOSIS — J069 Acute upper respiratory infection, unspecified: Secondary | ICD-10-CM | POA: Diagnosis not present

## 2020-06-18 DIAGNOSIS — K219 Gastro-esophageal reflux disease without esophagitis: Secondary | ICD-10-CM | POA: Diagnosis not present

## 2020-06-18 DIAGNOSIS — Z20822 Contact with and (suspected) exposure to covid-19: Secondary | ICD-10-CM | POA: Diagnosis not present

## 2020-06-18 DIAGNOSIS — Z79899 Other long term (current) drug therapy: Secondary | ICD-10-CM | POA: Diagnosis not present

## 2020-06-20 DIAGNOSIS — B839 Helminthiasis, unspecified: Secondary | ICD-10-CM | POA: Diagnosis not present

## 2020-06-20 DIAGNOSIS — J209 Acute bronchitis, unspecified: Secondary | ICD-10-CM | POA: Diagnosis not present

## 2020-06-20 DIAGNOSIS — K219 Gastro-esophageal reflux disease without esophagitis: Secondary | ICD-10-CM | POA: Diagnosis not present

## 2020-06-20 DIAGNOSIS — J45909 Unspecified asthma, uncomplicated: Secondary | ICD-10-CM | POA: Diagnosis not present

## 2020-06-20 DIAGNOSIS — J302 Other seasonal allergic rhinitis: Secondary | ICD-10-CM | POA: Diagnosis not present

## 2020-09-15 DIAGNOSIS — R1013 Epigastric pain: Secondary | ICD-10-CM | POA: Diagnosis not present

## 2020-09-15 DIAGNOSIS — R5382 Chronic fatigue, unspecified: Secondary | ICD-10-CM | POA: Diagnosis not present

## 2020-09-15 DIAGNOSIS — K219 Gastro-esophageal reflux disease without esophagitis: Secondary | ICD-10-CM | POA: Diagnosis not present

## 2020-09-15 DIAGNOSIS — R197 Diarrhea, unspecified: Secondary | ICD-10-CM | POA: Diagnosis not present

## 2020-10-14 DIAGNOSIS — R112 Nausea with vomiting, unspecified: Secondary | ICD-10-CM | POA: Diagnosis not present

## 2020-10-14 DIAGNOSIS — Z79899 Other long term (current) drug therapy: Secondary | ICD-10-CM | POA: Diagnosis not present

## 2020-10-14 DIAGNOSIS — R509 Fever, unspecified: Secondary | ICD-10-CM | POA: Diagnosis not present

## 2020-10-14 DIAGNOSIS — K219 Gastro-esophageal reflux disease without esophagitis: Secondary | ICD-10-CM | POA: Diagnosis not present

## 2020-10-14 DIAGNOSIS — E039 Hypothyroidism, unspecified: Secondary | ICD-10-CM | POA: Diagnosis not present

## 2020-10-14 DIAGNOSIS — Z87891 Personal history of nicotine dependence: Secondary | ICD-10-CM | POA: Diagnosis not present

## 2020-10-14 DIAGNOSIS — B349 Viral infection, unspecified: Secondary | ICD-10-CM | POA: Diagnosis not present

## 2020-10-14 DIAGNOSIS — Z20822 Contact with and (suspected) exposure to covid-19: Secondary | ICD-10-CM | POA: Diagnosis not present

## 2020-10-18 DIAGNOSIS — R197 Diarrhea, unspecified: Secondary | ICD-10-CM | POA: Diagnosis not present

## 2020-10-18 DIAGNOSIS — R1013 Epigastric pain: Secondary | ICD-10-CM | POA: Diagnosis not present

## 2020-10-24 DIAGNOSIS — N130 Hydronephrosis with ureteropelvic junction obstruction: Secondary | ICD-10-CM | POA: Diagnosis not present

## 2020-10-24 DIAGNOSIS — N202 Calculus of kidney with calculus of ureter: Secondary | ICD-10-CM | POA: Diagnosis not present

## 2020-10-24 DIAGNOSIS — R1032 Left lower quadrant pain: Secondary | ICD-10-CM | POA: Diagnosis not present

## 2020-10-24 DIAGNOSIS — Z8639 Personal history of other endocrine, nutritional and metabolic disease: Secondary | ICD-10-CM | POA: Diagnosis not present

## 2020-10-24 DIAGNOSIS — K219 Gastro-esophageal reflux disease without esophagitis: Secondary | ICD-10-CM | POA: Diagnosis not present

## 2020-10-24 DIAGNOSIS — N201 Calculus of ureter: Secondary | ICD-10-CM | POA: Diagnosis not present

## 2020-10-24 DIAGNOSIS — Z87891 Personal history of nicotine dependence: Secondary | ICD-10-CM | POA: Diagnosis not present

## 2020-10-24 DIAGNOSIS — Z8616 Personal history of COVID-19: Secondary | ICD-10-CM | POA: Diagnosis not present

## 2020-10-24 DIAGNOSIS — Z79899 Other long term (current) drug therapy: Secondary | ICD-10-CM | POA: Diagnosis not present

## 2020-11-01 DIAGNOSIS — K219 Gastro-esophageal reflux disease without esophagitis: Secondary | ICD-10-CM | POA: Diagnosis not present

## 2020-11-01 DIAGNOSIS — J45909 Unspecified asthma, uncomplicated: Secondary | ICD-10-CM | POA: Diagnosis not present

## 2020-11-01 DIAGNOSIS — E039 Hypothyroidism, unspecified: Secondary | ICD-10-CM | POA: Diagnosis not present

## 2020-11-01 DIAGNOSIS — E559 Vitamin D deficiency, unspecified: Secondary | ICD-10-CM | POA: Diagnosis not present

## 2020-11-01 DIAGNOSIS — N202 Calculus of kidney with calculus of ureter: Secondary | ICD-10-CM | POA: Diagnosis not present

## 2020-11-01 DIAGNOSIS — R0609 Other forms of dyspnea: Secondary | ICD-10-CM | POA: Diagnosis not present

## 2020-11-08 DIAGNOSIS — N201 Calculus of ureter: Secondary | ICD-10-CM | POA: Diagnosis not present

## 2020-11-08 DIAGNOSIS — N209 Urinary calculus, unspecified: Secondary | ICD-10-CM | POA: Diagnosis not present

## 2020-11-30 DIAGNOSIS — N201 Calculus of ureter: Secondary | ICD-10-CM | POA: Diagnosis not present

## 2020-11-30 DIAGNOSIS — N200 Calculus of kidney: Secondary | ICD-10-CM | POA: Diagnosis not present

## 2020-12-06 DIAGNOSIS — W182XXA Fall in (into) shower or empty bathtub, initial encounter: Secondary | ICD-10-CM | POA: Diagnosis not present

## 2020-12-06 DIAGNOSIS — M25521 Pain in right elbow: Secondary | ICD-10-CM | POA: Diagnosis not present

## 2020-12-06 DIAGNOSIS — Z8639 Personal history of other endocrine, nutritional and metabolic disease: Secondary | ICD-10-CM | POA: Diagnosis not present

## 2020-12-06 DIAGNOSIS — S5001XA Contusion of right elbow, initial encounter: Secondary | ICD-10-CM | POA: Diagnosis not present

## 2020-12-06 DIAGNOSIS — K219 Gastro-esophageal reflux disease without esophagitis: Secondary | ICD-10-CM | POA: Diagnosis not present

## 2020-12-06 DIAGNOSIS — Z79899 Other long term (current) drug therapy: Secondary | ICD-10-CM | POA: Diagnosis not present

## 2020-12-07 IMAGING — CT CT CHEST HIGH RESOLUTION W/O CM
2 of 5 series · 15 of 36 positions shown, 18 images · non-contrast
Comparison: 02/01/2020 chest radiograph.

CLINICAL DATA: Cough and dyspnea, chronic, since 9956.
Gastroesophageal reflux disease. Chest and asthma.

EXAM:
CT CHEST WITHOUT CONTRAST
TECHNIQUE: Multidetector CT imaging of the chest was performed following the
standard protocol without intravenous contrast. High resolution
imaging of the lungs, as well as inspiratory and expiratory imaging,
was performed.

[Series 2: thorax · axial · 0.70mm/px · z∈[-306,-48]mm · 12 of 143 slices shown, 15 images]
[im 7/143  mediastinal]
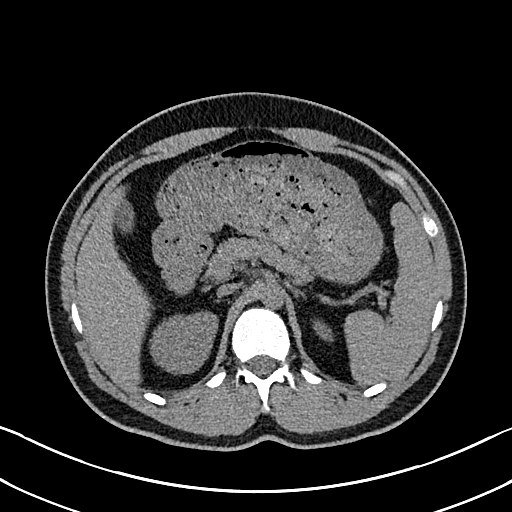
[im 7/143  lung]
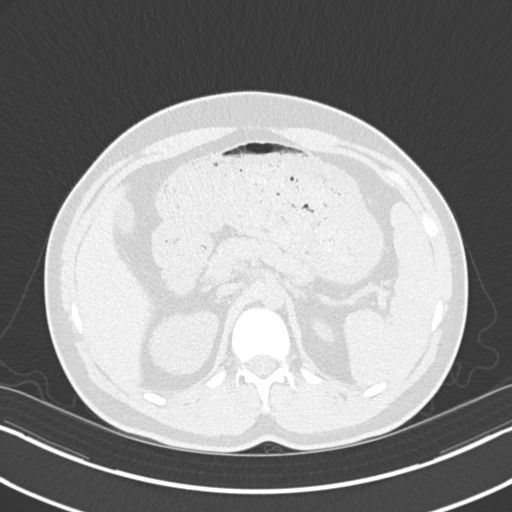
[im 21/143  lung]
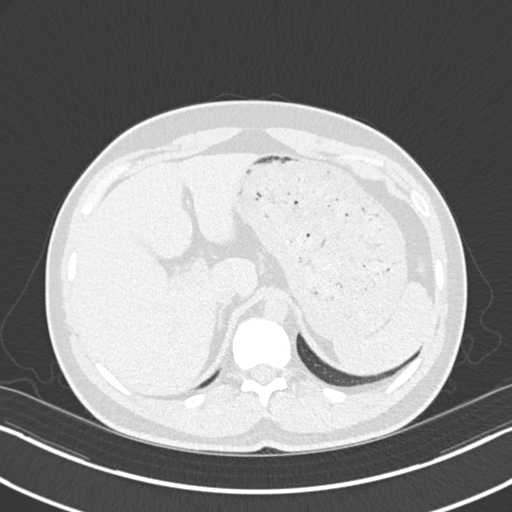
[im 34/143  lung]
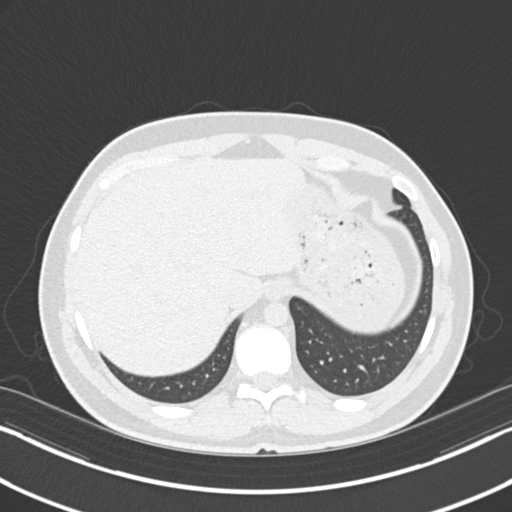
[im 41/143  lung]
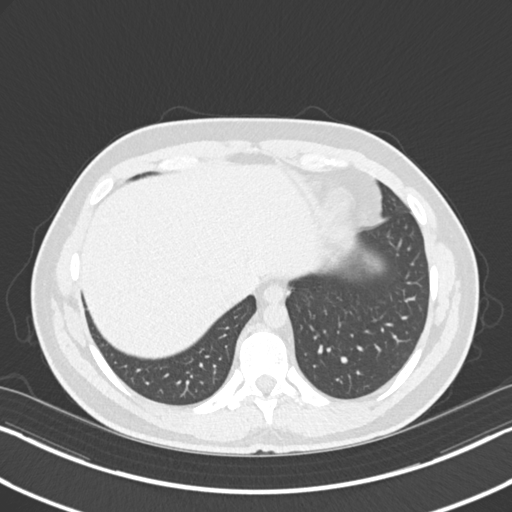
[im 55/143  mediastinal]
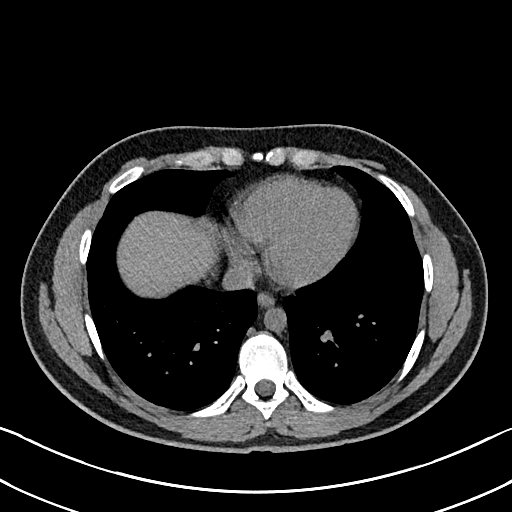
[im 55/143  lung]
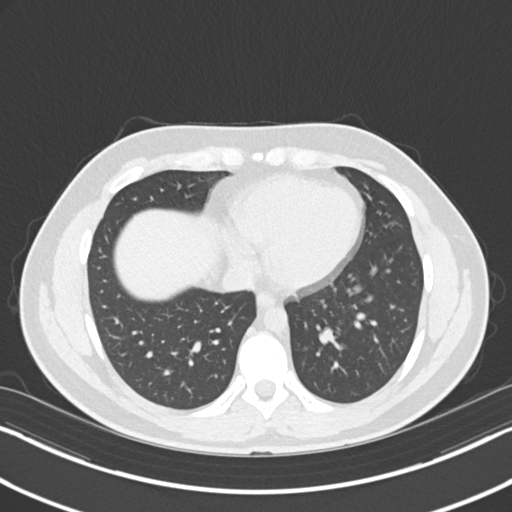
[im 68/143  lung]
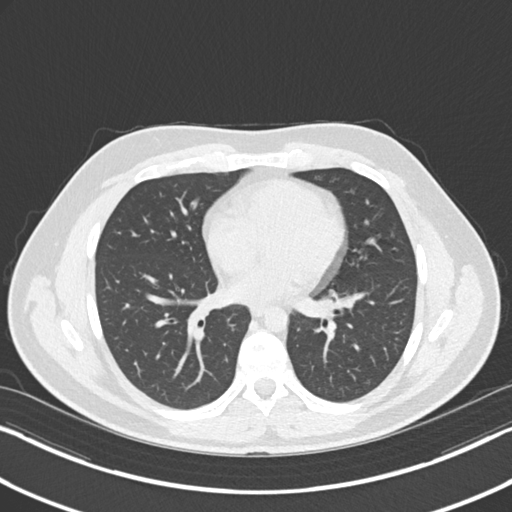
[im 75/143  lung]
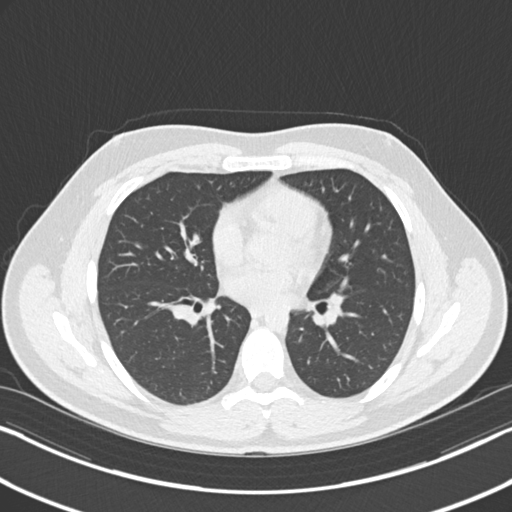
[im 88/143  lung]
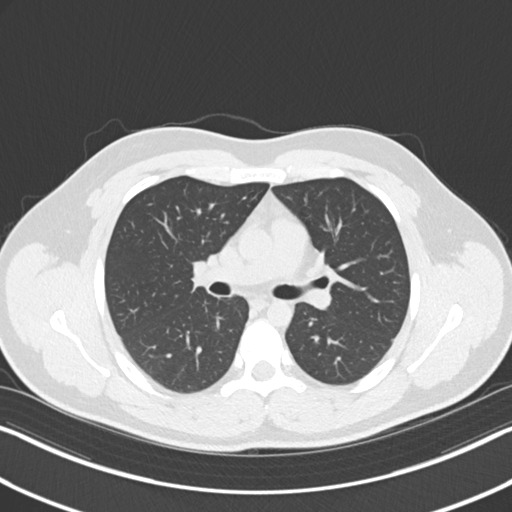
[im 102/143  mediastinal]
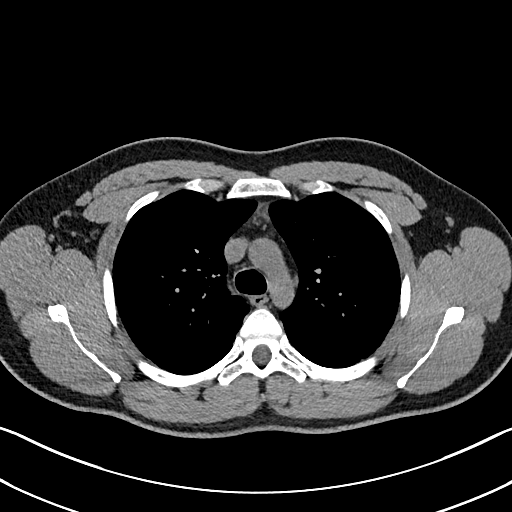
[im 102/143  lung]
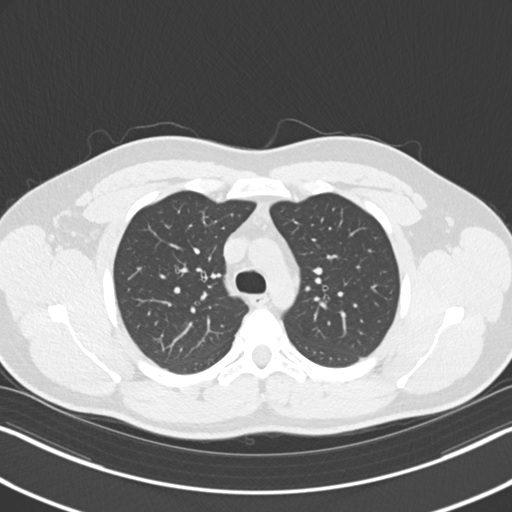
[im 109/143  lung]
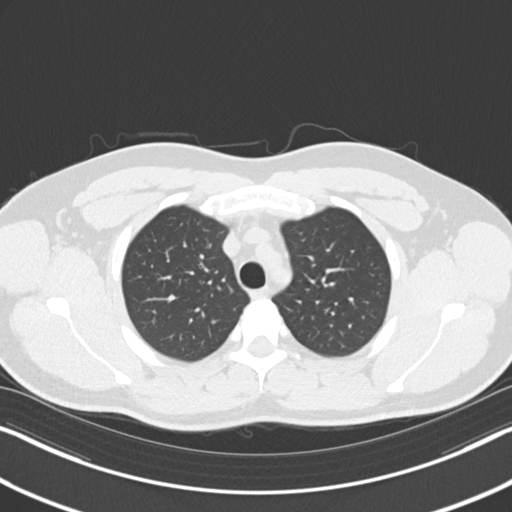
[im 122/143  lung]
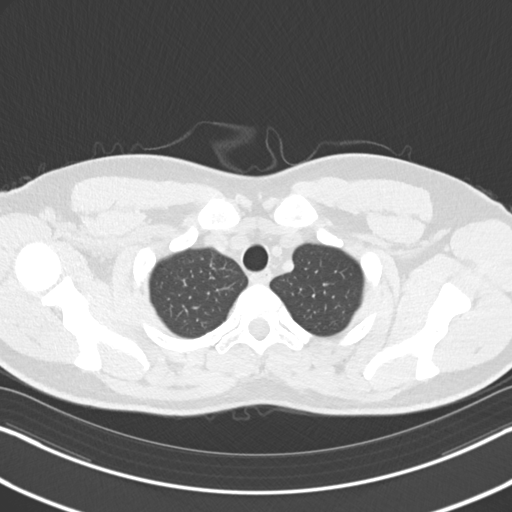
[im 136/143  lung]
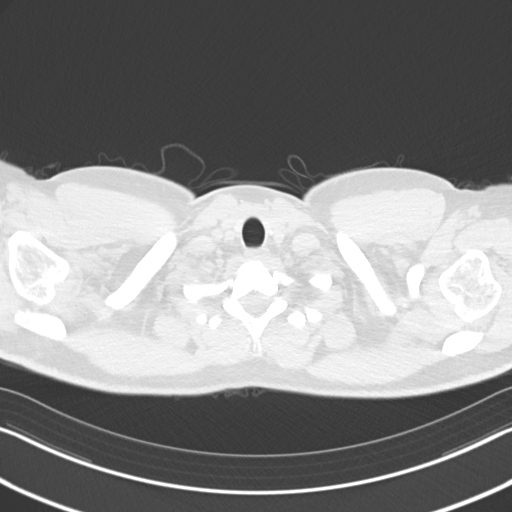

[Series 8: coronal · coronal · 0.60mm/px · 3 of 115 slices shown]
[im 23/115  lung]
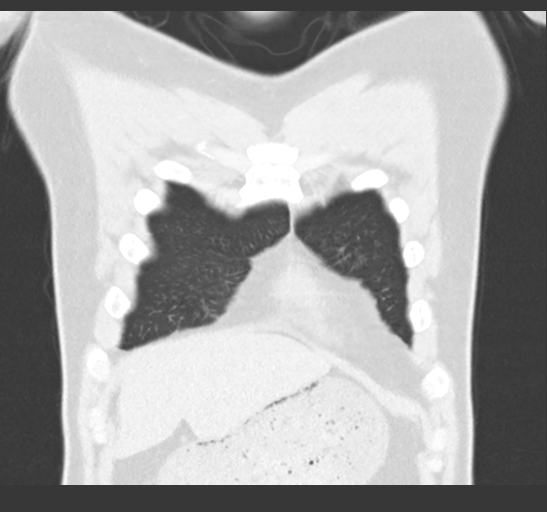
[im 46/115  lung]
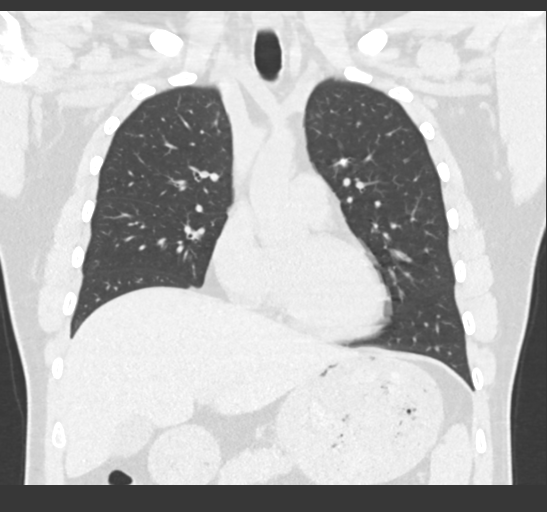
[im 69/115  lung]
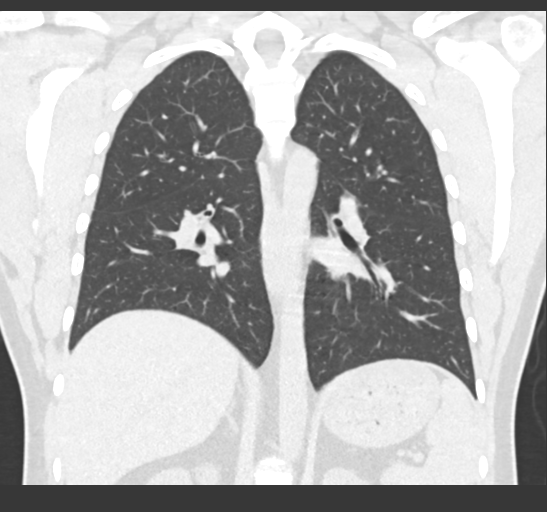

[15 of 36 positions shown; findings below may reference images not displayed]

FINDINGS: Cardiovascular: Normal heart size. No significant pericardial
effusion/thickening. Great vessels are normal in course and caliber.

Mediastinum/Nodes: No discrete thyroid nodules. Unremarkable
esophagus. No pathologically enlarged axillary, mediastinal or hilar
lymph nodes, noting limited sensitivity for the detection of hilar
adenopathy on this noncontrast study.

Lungs/Pleura: No pneumothorax. No pleural effusion. No acute
consolidative airspace disease, lung masses or significant pulmonary
nodules. No significant air trapping or evidence of
tracheobronchomalacia on the expiration sequence. No significant
regions of subpleural reticulation, ground-glass attenuation,
parenchymal banding, traction bronchiectasis, architectural
distortion or frank honeycombing. Diffuse bronchial wall thickening.

Upper abdomen: No acute abnormality.

Musculoskeletal:  No aggressive appearing focal osseous lesions.
IMPRESSION: 1. No evidence of interstitial lung disease.
2. Nonspecific diffuse bronchial wall thickening, compatible with
reported history of reactive airways disease.

## 2020-12-23 DIAGNOSIS — Z79899 Other long term (current) drug therapy: Secondary | ICD-10-CM | POA: Diagnosis not present

## 2020-12-23 DIAGNOSIS — B356 Tinea cruris: Secondary | ICD-10-CM | POA: Diagnosis not present

## 2020-12-23 DIAGNOSIS — K219 Gastro-esophageal reflux disease without esophagitis: Secondary | ICD-10-CM | POA: Diagnosis not present

## 2020-12-26 DIAGNOSIS — R399 Unspecified symptoms and signs involving the genitourinary system: Secondary | ICD-10-CM | POA: Diagnosis not present

## 2020-12-26 DIAGNOSIS — E559 Vitamin D deficiency, unspecified: Secondary | ICD-10-CM | POA: Diagnosis not present

## 2020-12-26 DIAGNOSIS — Z87442 Personal history of urinary calculi: Secondary | ICD-10-CM | POA: Diagnosis not present

## 2020-12-26 DIAGNOSIS — B356 Tinea cruris: Secondary | ICD-10-CM | POA: Diagnosis not present

## 2020-12-26 DIAGNOSIS — J45909 Unspecified asthma, uncomplicated: Secondary | ICD-10-CM | POA: Diagnosis not present

## 2020-12-27 DIAGNOSIS — R399 Unspecified symptoms and signs involving the genitourinary system: Secondary | ICD-10-CM | POA: Diagnosis not present

## 2021-01-03 DIAGNOSIS — Z87442 Personal history of urinary calculi: Secondary | ICD-10-CM | POA: Diagnosis not present

## 2021-01-03 DIAGNOSIS — R300 Dysuria: Secondary | ICD-10-CM | POA: Diagnosis not present

## 2021-01-15 DIAGNOSIS — M4204 Juvenile osteochondrosis of spine, thoracic region: Secondary | ICD-10-CM | POA: Diagnosis not present

## 2021-01-15 DIAGNOSIS — N202 Calculus of kidney with calculus of ureter: Secondary | ICD-10-CM | POA: Diagnosis not present

## 2021-01-15 DIAGNOSIS — N2882 Megaloureter: Secondary | ICD-10-CM | POA: Diagnosis not present

## 2021-01-17 DIAGNOSIS — N201 Calculus of ureter: Secondary | ICD-10-CM | POA: Diagnosis not present

## 2021-01-17 DIAGNOSIS — Z87442 Personal history of urinary calculi: Secondary | ICD-10-CM | POA: Diagnosis not present

## 2021-01-17 DIAGNOSIS — N200 Calculus of kidney: Secondary | ICD-10-CM | POA: Diagnosis not present

## 2021-02-28 DIAGNOSIS — Z8639 Personal history of other endocrine, nutritional and metabolic disease: Secondary | ICD-10-CM | POA: Diagnosis not present

## 2021-02-28 DIAGNOSIS — N50811 Right testicular pain: Secondary | ICD-10-CM | POA: Diagnosis not present

## 2021-02-28 DIAGNOSIS — N50819 Testicular pain, unspecified: Secondary | ICD-10-CM | POA: Diagnosis not present

## 2021-02-28 DIAGNOSIS — Z28310 Unvaccinated for covid-19: Secondary | ICD-10-CM | POA: Diagnosis not present

## 2021-02-28 DIAGNOSIS — Z79899 Other long term (current) drug therapy: Secondary | ICD-10-CM | POA: Diagnosis not present

## 2021-02-28 DIAGNOSIS — R1031 Right lower quadrant pain: Secondary | ICD-10-CM | POA: Diagnosis not present

## 2021-02-28 DIAGNOSIS — Z87891 Personal history of nicotine dependence: Secondary | ICD-10-CM | POA: Diagnosis not present

## 2021-02-28 DIAGNOSIS — K219 Gastro-esophageal reflux disease without esophagitis: Secondary | ICD-10-CM | POA: Diagnosis not present

## 2021-02-28 DIAGNOSIS — K760 Fatty (change of) liver, not elsewhere classified: Secondary | ICD-10-CM | POA: Diagnosis not present

## 2021-04-24 DIAGNOSIS — R109 Unspecified abdominal pain: Secondary | ICD-10-CM | POA: Diagnosis not present

## 2021-04-25 DIAGNOSIS — K143 Hypertrophy of tongue papillae: Secondary | ICD-10-CM | POA: Diagnosis not present

## 2021-04-25 DIAGNOSIS — K219 Gastro-esophageal reflux disease without esophagitis: Secondary | ICD-10-CM | POA: Diagnosis not present

## 2021-04-25 DIAGNOSIS — R1312 Dysphagia, oropharyngeal phase: Secondary | ICD-10-CM | POA: Diagnosis not present

## 2021-04-25 DIAGNOSIS — R1013 Epigastric pain: Secondary | ICD-10-CM | POA: Diagnosis not present

## 2021-05-05 DIAGNOSIS — Z79899 Other long term (current) drug therapy: Secondary | ICD-10-CM | POA: Diagnosis not present

## 2021-05-05 DIAGNOSIS — Z20822 Contact with and (suspected) exposure to covid-19: Secondary | ICD-10-CM | POA: Diagnosis not present

## 2021-05-05 DIAGNOSIS — K219 Gastro-esophageal reflux disease without esophagitis: Secondary | ICD-10-CM | POA: Diagnosis not present

## 2021-05-05 DIAGNOSIS — J029 Acute pharyngitis, unspecified: Secondary | ICD-10-CM | POA: Diagnosis not present

## 2021-05-16 DIAGNOSIS — R369 Urethral discharge, unspecified: Secondary | ICD-10-CM | POA: Diagnosis not present

## 2021-05-16 DIAGNOSIS — R399 Unspecified symptoms and signs involving the genitourinary system: Secondary | ICD-10-CM | POA: Diagnosis not present

## 2021-05-25 DIAGNOSIS — R369 Urethral discharge, unspecified: Secondary | ICD-10-CM | POA: Diagnosis not present

## 2022-08-23 DIAGNOSIS — E559 Vitamin D deficiency, unspecified: Secondary | ICD-10-CM | POA: Insufficient documentation

## 2022-10-28 ENCOUNTER — Emergency Department (HOSPITAL_COMMUNITY)
Admission: EM | Admit: 2022-10-28 | Discharge: 2022-10-28 | Disposition: A | Discharge status: Home / Self Care | Payer: Commercial Managed Care - PPO | Attending: Emergency Medicine | Admitting: Emergency Medicine

## 2022-10-28 ENCOUNTER — Other Ambulatory Visit: Payer: Self-pay

## 2022-10-28 DIAGNOSIS — R8289 Other abnormal findings on cytological and histological examination of urine: Secondary | ICD-10-CM | POA: Insufficient documentation

## 2022-10-28 DIAGNOSIS — Z113 Encounter for screening for infections with a predominantly sexual mode of transmission: Secondary | ICD-10-CM

## 2022-10-28 DIAGNOSIS — Z202 Contact with and (suspected) exposure to infections with a predominantly sexual mode of transmission: Secondary | ICD-10-CM | POA: Insufficient documentation

## 2022-10-28 LAB — COMPREHENSIVE METABOLIC PANEL
ALT: 27 U/L (ref 0–44)
AST: 20 U/L (ref 15–41)
Albumin: 3.7 g/dL (ref 3.5–5.0)
Alkaline Phosphatase: 41 U/L (ref 38–126)
Anion gap: 8 (ref 5–15)
BUN: 8 mg/dL (ref 6–20)
CO2: 28 mmol/L (ref 22–32)
Calcium: 9.3 mg/dL (ref 8.9–10.3)
Chloride: 103 mmol/L (ref 98–111)
Creatinine, Ser: 1.05 mg/dL (ref 0.61–1.24)
GFR, Estimated: >60 mL/min (ref >60)
Glucose, Bld: 99 mg/dL (ref 70–99)
Potassium: 4.2 mmol/L (ref 3.5–5.1)
Sodium: 139 mmol/L (ref 135–145)
Total Bilirubin: 0.4 mg/dL (ref 0.3–1.2)
Total Protein: 6.3 g/dL — ABNORMAL LOW (ref 6.5–8.1)

## 2022-10-28 LAB — CBC WITH DIFFERENTIAL/PLATELET
Abs Immature Granulocytes: 0.02 10*3/uL (ref 0.00–0.07)
Basophils Absolute: 0.1 10*3/uL (ref 0.0–0.1)
Basophils Relative: 1 %
Eosinophils Absolute: 0.1 10*3/uL (ref 0.0–0.5)
Eosinophils Relative: 1 %
HCT: 44.4 % (ref 39.0–52.0)
Hemoglobin: 15.1 g/dL (ref 13.0–17.0)
Immature Granulocytes: 0 %
Lymphocytes Relative: 33 %
Lymphs Abs: 1.5 10*3/uL (ref 0.7–4.0)
MCH: 30.8 pg (ref 26.0–34.0)
MCHC: 34 g/dL (ref 30.0–36.0)
MCV: 90.4 fL (ref 80.0–100.0)
Monocytes Absolute: 0.5 10*3/uL (ref 0.1–1.0)
Monocytes Relative: 10 %
Neutro Abs: 2.6 10*3/uL (ref 1.7–7.7)
Neutrophils Relative %: 55 %
Platelets: 230 10*3/uL (ref 150–400)
RBC: 4.91 MIL/uL (ref 4.22–5.81)
RDW: 12.4 % (ref 11.5–15.5)
WBC: 4.7 10*3/uL (ref 4.0–10.5)
nRBC: 0 % (ref 0.0–0.2)

## 2022-10-28 LAB — URINALYSIS, ROUTINE W REFLEX MICROSCOPIC
Bilirubin Urine: NEGATIVE
Glucose, UA: NEGATIVE mg/dL
Hgb urine dipstick: NEGATIVE
Ketones, ur: NEGATIVE mg/dL
Nitrite: NEGATIVE
Protein, ur: NEGATIVE mg/dL
Specific Gravity, Urine: 1.017 (ref 1.005–1.030)
pH: 7 (ref 5.0–8.0)

## 2022-10-28 LAB — LIPASE, BLOOD: Lipase: 27 U/L (ref 11–51)

## 2022-10-28 LAB — HIV ANTIBODY (ROUTINE TESTING W REFLEX): HIV Screen 4th Generation wRfx: NONREACTIVE

## 2022-10-28 MED ORDER — DOXYCYCLINE HYCLATE 100 MG PO TABS: 100.0000 mg | ORAL_TABLET | Freq: Two times a day (BID) | ORAL | 0 refills | Status: DC

## 2022-10-28 MED ORDER — CEFTRIAXONE SODIUM 500 MG IJ SOLR
500.0000 mg | Freq: Once | INTRAMUSCULAR | Status: AC
  Administered 2022-10-28: 500 mg via INTRAMUSCULAR
  Filled 2022-10-28: qty 500

## 2022-10-28 MED ORDER — LIDOCAINE HCL (PF) 1 % IJ SOLN
1.0000 mL | Freq: Once | INTRAMUSCULAR | Status: AC
  Administered 2022-10-28: 1 mL
  Filled 2022-10-28: qty 5

## 2022-10-28 NOTE — Discharge Instructions (Signed)
Please take the antibiotics as prescribed, do not become sexually active with anyone until you have completed your course of antibiotics.  Please let your partners know if you have been diagnosed with gonorrhea or chlamydia, you will receive a call if you test positive.  Your results should be in within 3 to 5 days.

## 2022-10-28 NOTE — ED Provider Notes (Signed)
[facility] Provider Note   CSN: [id number] Arrival date & time: 10/28/22  1307     History  Chief Complaint  Patient presents with   SEXUALLY TRANSMITTED DISEASE    Keith Lawrence is a 27 y.o. male, pertinent past medical history, who presents to the ED secondary to penile burning while having sex.  He denies any penile discharge, dysuria, urinary frequency, urgency, back pain, testicular pain, fever or chills.  States that he did have sex with his ex-girlfriend, and she had chlamydia a few years ago, and that he wanted to be evaluated for an STD.    Home Medications Prior to Admission medications   Medication Sig Start Date End Date Taking? Authorizing Provider  doxycycline (VIBRA-TABS) 100 MG tablet Take 1 tablet (100 mg total) by mouth 2 (two) times daily. 10/28/22  Yes Lin Hackmann L, PA  Ascorbic Acid (VITAMIN C) 1000 MG tablet Take 1,000 mg by mouth daily.    [provider]  B Complex-Folic Acid (B COMPLEX PLUS PO) Take 1 capsule by mouth daily.    [provider]  levothyroxine (SYNTHROID) 50 MCG tablet Take 50 mcg by mouth daily before breakfast.    [provider]  omeprazole (PRILOSEC) 40 MG capsule Take 1 capsule (40 mg total) by mouth in the morning and at bedtime. 02/01/20   Nicolette Bang, MD      Allergies    Patient has no known allergies.    Review of Systems   Review of Systems  Genitourinary:  Negative for difficulty urinating, frequency, penile discharge, penile swelling and testicular pain.    Physical Exam Updated Vital Signs BP 124/84   Pulse 77   Temp 98.2 F (36.8 C) (Oral)   Resp 18   Ht 5\' 4"  (1.626 m)   Wt 68 kg   SpO2 99%   BMI 25.75 kg/m  Physical Exam Vitals and nursing note reviewed.  Constitutional:      General: He is not in acute distress.    Appearance: He is well-developed.  HENT:     Head: Normocephalic and atraumatic.  Eyes:      Conjunctiva/sclera: Conjunctivae normal.  Cardiovascular:     Rate and Rhythm: Normal rate and regular rhythm.     Heart sounds: No murmur heard. Pulmonary:     Effort: Pulmonary effort is normal. No respiratory distress.     Breath sounds: Normal breath sounds.  Abdominal:     Palpations: Abdomen is soft.     Tenderness: There is no abdominal tenderness.  Genitourinary:    Comments: Declined 2/2 to being in hallway bed Musculoskeletal:        General: No swelling.     Cervical back: Neck supple.  Skin:    General: Skin is warm and dry.     Capillary Refill: Capillary refill takes less than 2 seconds.  Neurological:     Mental Status: He is alert.  Psychiatric:        Mood and Affect: Mood normal.     ED Results / Procedures / Treatments   Labs (all labs ordered are listed, but only abnormal results are displayed) Labs Reviewed  COMPREHENSIVE METABOLIC PANEL - Abnormal; Notable for the following components:      Result Value   Total Protein 6.3 (*)    All other components within normal limits  URINALYSIS, ROUTINE W REFLEX MICROSCOPIC - Abnormal; Notable for the following components:   APPearance CLOUDY (*)  Leukocytes,Ua [place] Safer (*)    Bacteria, UA RARE (*)    All other components within normal limits  CBC WITH DIFFERENTIAL/PLATELET  LIPASE, BLOOD  HIV ANTIBODY (ROUTINE TESTING W REFLEX)  RPR  GC/CHLAMYDIA PROBE AMP ([place]) NOT AT [facility]    EKG None  Radiology No results found.  Procedures Procedures    Medications Ordered in ED Medications  cefTRIAXone (ROCEPHIN) injection 500 mg (500 mg Intramuscular Given 10/28/22 1655)  lidocaine (PF) (XYLOCAINE) 1 % injection 1-2.1 mL (1 mL Other Given 10/28/22 1655)    ED Course/ Medical Decision Making/ A&P  :1}                          Medical Decision Making Patient is a 27 year old male, here for possible STD exposure.  He states it hurts when he has sex, and denies any urinary symptoms, penile discharge.   We will treat him prophylactically.  Doxycycline sent to the pharmacy.  Return precautions emphasized.  Urinalysis unremarkable except for some leukocytes, and trace bacteria, this is likely secondary to an STD.  Amount and/or Complexity of Data Reviewed Labs:     Details: Urinalysis significant for some leukocytes and trace bacteria  Risk Prescription drug management.    Final Clinical Impression(s) / ED Diagnoses Final diagnoses:  Encounter for screening examination for sexually transmitted disease    Rx / DC Orders ED Discharge Orders          Ordered    doxycycline (VIBRA-TABS) 100 MG tablet  2 times daily        10/28/22 1610              Verlan Grotz, [place], [place] 10/28/22 1658    Sherwood Gambler, MD 10/28/22 1709

## 2022-10-28 NOTE — ED Triage Notes (Signed)
Pt complains of burning with sex x 2 days. Denies burning with urination. Some lower abd pain on left side started yesterday. Pt states unsure if possible UTI or STD. PT slept with ex girlfriend but unsure if she had STD.

## 2022-10-28 NOTE — ED Provider Triage Note (Signed)
Emergency Medicine Provider Triage Evaluation Note  Shadi Sessler [place] , a 27 y.o. male  was evaluated in triage.  Pt complains of concerns for STD.  Notes that he was sexually active with his ex girlfriend 2 days ago without use of protection.  He denies his ex-girlfriend told him that she had any positive symptoms.  However notes previous history of STD.  Also notes left lower quadrant abdominal pain yesterday that was a sharp shooting sensation.  Denies urinary symptoms, penile pain/swelling, testicular pain/swelling, nausea, vomiting, URI-like symptoms.  Review of Systems  Positive:  Negative:   Physical Exam  BP 124/84   Pulse 77   Temp 98.2 F (36.8 C) (Oral)   Resp 18   Ht 5\' 4"  (1.626 m)   Wt 68 kg   SpO2 99%   BMI 25.75 kg/m  Gen:   Awake, no distress  Resp:  Normal effort  MSK:   Moves extremities without difficulty  Other:  GU exam deferred in triage.  Mild left lower quadrant abdominal tenderness to palpation.  Medical Decision Making  Medically screening exam initiated at 1:32 PM.  Appropriate orders placed.  Lavern Maslow [place] was informed that the remainder of the evaluation will be completed by another provider, this initial triage assessment does not replace that evaluation, and the importance of remaining in the ED until their evaluation is complete.  Workup initiated   Caylea Foronda A, PA-C 10/28/22 1337

## 2022-10-29 LAB — GC/CHLAMYDIA PROBE AMP (~~LOC~~) NOT AT ARMC
Chlamydia: NEGATIVE
Comment: NEGATIVE
Comment: NORMAL
Neisseria Gonorrhea: NEGATIVE

## 2022-10-29 LAB — RPR: RPR Ser Ql: NONREACTIVE

## 2022-11-11 ENCOUNTER — Emergency Department (HOSPITAL_COMMUNITY): Payer: Self-pay

## 2022-11-11 ENCOUNTER — Emergency Department (HOSPITAL_COMMUNITY)
Admission: EM | Admit: 2022-11-11 | Discharge: 2022-11-11 | Disposition: A | Discharge status: Home / Self Care | Payer: Self-pay | Attending: Emergency Medicine | Admitting: Emergency Medicine

## 2022-11-11 DIAGNOSIS — R300 Dysuria: Secondary | ICD-10-CM | POA: Insufficient documentation

## 2022-11-11 DIAGNOSIS — R1032 Left lower quadrant pain: Secondary | ICD-10-CM | POA: Insufficient documentation

## 2022-11-11 LAB — CBC WITH DIFFERENTIAL/PLATELET
Abs Immature Granulocytes: 0.02 10*3/uL (ref 0.00–0.07)
Basophils Absolute: 0.1 10*3/uL (ref 0.0–0.1)
Basophils Relative: 1 %
Eosinophils Absolute: 0 10*3/uL (ref 0.0–0.5)
Eosinophils Relative: 1 %
HCT: 40.8 % (ref 39.0–52.0)
Hemoglobin: 14 g/dL (ref 13.0–17.0)
Immature Granulocytes: 0 %
Lymphocytes Relative: 40 %
Lymphs Abs: 1.9 10*3/uL (ref 0.7–4.0)
MCH: 30.8 pg (ref 26.0–34.0)
MCHC: 34.3 g/dL (ref 30.0–36.0)
MCV: 89.7 fL (ref 80.0–100.0)
Monocytes Absolute: 0.3 10*3/uL (ref 0.1–1.0)
Monocytes Relative: 7 %
Neutro Abs: 2.4 10*3/uL (ref 1.7–7.7)
Neutrophils Relative %: 51 %
Platelets: 252 10*3/uL (ref 150–400)
RBC: 4.55 MIL/uL (ref 4.22–5.81)
RDW: 12.6 % (ref 11.5–15.5)
WBC: 4.8 10*3/uL (ref 4.0–10.5)
nRBC: 0 % (ref 0.0–0.2)

## 2022-11-11 LAB — COMPREHENSIVE METABOLIC PANEL
ALT: 21 U/L (ref 0–44)
AST: 17 U/L (ref 15–41)
Albumin: 3.9 g/dL (ref 3.5–5.0)
Alkaline Phosphatase: 34 U/L — ABNORMAL LOW (ref 38–126)
Anion gap: 8 (ref 5–15)
BUN: 12 mg/dL (ref 6–20)
CO2: 27 mmol/L (ref 22–32)
Calcium: 9.3 mg/dL (ref 8.9–10.3)
Chloride: 107 mmol/L (ref 98–111)
Creatinine, Ser: 0.92 mg/dL (ref 0.61–1.24)
GFR, Estimated: >60 mL/min (ref >60)
Glucose, Bld: 99 mg/dL (ref 70–99)
Potassium: 4.4 mmol/L (ref 3.5–5.1)
Sodium: 142 mmol/L (ref 135–145)
Total Bilirubin: 0.7 mg/dL (ref 0.3–1.2)
Total Protein: 6.5 g/dL (ref 6.5–8.1)

## 2022-11-11 LAB — URINALYSIS, W/ REFLEX TO CULTURE (INFECTION SUSPECTED)
Bilirubin Urine: NEGATIVE
Glucose, UA: NEGATIVE mg/dL
Hgb urine dipstick: NEGATIVE
Ketones, ur: NEGATIVE mg/dL
Leukocytes,Ua: NEGATIVE
Nitrite: NEGATIVE
Protein, ur: NEGATIVE mg/dL
Specific Gravity, Urine: 1.021 (ref 1.005–1.030)
pH: 6 (ref 5.0–8.0)

## 2022-11-11 MED ORDER — CEFUROXIME AXETIL 500 MG PO TABS: 500.0000 mg | ORAL_TABLET | Freq: Two times a day (BID) | ORAL | 0 refills | Status: DC

## 2022-11-11 NOTE — Discharge Instructions (Addendum)
You were seen in the ER for evaluation of your pain with urination.  I am sending your urine off for culture. Your lab work and imaging were unremarkable. Given your symptoms, would like for you to follow-up with a urologist. I am discharging you home with an antibiotic that you will take twice a day for the next week. Again, please make sure you call the urologist to schedule an appointment. Please make sure you are drinking plenty of fluids. If you have any concerns, new or worsening symptoms, please return to the nearest ER for re-evaluation.   Contact a health care provider if: You have a fever. You develop pain in your back or sides. You have nausea or vomiting. You have blood in your urine. You are not urinating as often as you usually do. Get help right away if: Your pain is severe and not relieved with medicines. You cannot eat or drink without vomiting. You are confused. You have a rapid heartbeat while resting. You have shaking or chills. You feel extremely weak.

## 2022-11-11 NOTE — ED Provider Notes (Signed)
[facility] Provider Note   CSN: [id number] Arrival date & time: 11/11/22  1150     History Chief Complaint  Patient presents with   Dysuria    Keith Lawrence is a 27 y.o. male presents to the ER for evaluation of dysuria for the past 2 weeks.  Patient was seen here on 10-28-2022 previously was given doxycycline for presumed chlamydia.  Patient had pain on ejaculation during sex but no pain during the actual intercourse.  He reports he was having some dysuria at the end of his stream.  No urgency, frequency, penile discharge, testicular pain or swelling, hematuria, or rash noted.  He denies any fever, abdominal, or back pain.  He reports that he finished doxycycline however still having that pain at the end of his urine stream.   Dysuria Presenting symptoms: dysuria   Presenting symptoms: no penile discharge, no penile pain, no scrotal pain and no swelling   Associated symptoms: no abdominal pain, no diarrhea, no fever, no flank pain, no hematuria, no nausea, no penile swelling, no scrotal swelling, no urinary frequency and no vomiting        Home Medications Prior to Admission medications   Medication Sig Start Date End Date Taking? Authorizing Provider  Ascorbic Acid (VITAMIN C) 1000 MG tablet Take 1,000 mg by mouth daily.    [provider]  B Complex-Folic Acid (B COMPLEX PLUS PO) Take 1 capsule by mouth daily.    [provider]  doxycycline (VIBRA-TABS) 100 MG tablet Take 1 tablet (100 mg total) by mouth 2 (two) times daily. 10/28/22   Small, Brooke L, PA  levothyroxine (SYNTHROID) 50 MCG tablet Take 50 mcg by mouth daily before breakfast.    [provider]  omeprazole (PRILOSEC) 40 MG capsule Take 1 capsule (40 mg total) by mouth in the morning and at bedtime. 02/01/20   Nicolette Bang, MD      Allergies    Patient has no known allergies.    Review of Systems   Review of Systems   Constitutional:  Negative for chills and fever.  Gastrointestinal:  Negative for abdominal pain, constipation, diarrhea, nausea and vomiting.  Genitourinary:  Positive for dysuria. Negative for flank pain, frequency, hematuria, penile discharge, penile pain, penile swelling, scrotal swelling, testicular pain and urgency.  Musculoskeletal:  Negative for back pain.    Physical Exam Updated Vital Signs BP 114/73 (BP Location: Right Arm)   Pulse 69   Temp 98 F (36.7 C) (Oral)   Resp 18   SpO2 100%  Physical Exam Vitals and nursing note reviewed. Exam conducted with a chaperone present Abe People, Therapist, sports).  Constitutional:      General: He is not in acute distress.    Appearance: Normal appearance. He is not ill-appearing or toxic-appearing.  Eyes:     General: No scleral icterus. Pulmonary:     Effort: Pulmonary effort is normal. No respiratory distress.  Abdominal:     Tenderness: There is abdominal tenderness. There is no right CVA tenderness, left CVA tenderness, guarding or rebound.     Hernia: There is no hernia in the left inguinal area or right inguinal area.     Comments: Very, mild left lower quadrant tenderness to palpation.  Abdomen soft.  No rebound or guarding.  Genitourinary:    Pubic Area: No rash.      Penis: Normal and circumcised. No discharge.      Testes: Normal.  Right: Tenderness or swelling not present.        Left: Tenderness or swelling not present.     Epididymis:     Right: Normal.     Left: Normal.     Comments: Billy, RN as chaperone.  GU exam unremarkable.  No rash seen.  No overlying erythema, or skin changes noted to the pubic area.  Testes appear normal, both descended.  No tenderness or swelling to the testes or the epididymides.  No hernias in the inguinal canals.  No urethral strictures seen.  No penile discharge.  Circumcised. Skin:    General: Skin is dry.     Findings: No rash.  Neurological:     General: No focal deficit present.      Mental Status: He is alert. Mental status is at baseline.  Psychiatric:        Mood and Affect: Mood normal.     ED Results / Procedures / Treatments   Labs (all labs ordered are listed, but only abnormal results are displayed) Labs Reviewed  URINALYSIS, W/ REFLEX TO CULTURE (INFECTION SUSPECTED) - Abnormal; Notable for the following components:      Result Value   APPearance HAZY (*)    Bacteria, UA RARE (*)    All other components within normal limits  COMPREHENSIVE METABOLIC PANEL - Abnormal; Notable for the following components:   Alkaline Phosphatase 34 (*)    All other components within normal limits  URINE CULTURE  CBC WITH DIFFERENTIAL/PLATELET    EKG None  Radiology CT Renal Stone Study  Result Date: 11/11/2022 CLINICAL DATA:  Abdominal/flank pain on the left. Stone suspected. Left lower quadrant symptoms. EXAM: CT ABDOMEN AND PELVIS WITHOUT CONTRAST TECHNIQUE: Multidetector CT imaging of the abdomen and pelvis was performed following the standard protocol without IV contrast. RADIATION DOSE REDUCTION: This exam was performed according to the departmental dose-optimization program which includes automated exposure control, adjustment of the mA and/or kV according to patient size and/or use of iterative reconstruction technique. COMPARISON:  01/22/2018. FINDINGS: Lower chest: Negative Hepatobiliary: Liver parenchyma is normal.  No calcified gallstones. Pancreas: Normal Spleen: Normal Adrenals/Urinary Tract: Adrenal glands are normal. Kidneys are normal. No stone or hydroureteronephrosis. Bladder is normal. Stomach/Bowel: Stomach and small intestine are normal. Normal appendix. Normal colon. Vascular/Lymphatic: Aorta and IVC are normal.  No adenopathy. Reproductive: Normal Other: No free fluid or air. Musculoskeletal: Normal IMPRESSION: Normal examination. No abnormality seen to explain the clinical presentation. No evidence of urinary tract stone disease or other urinary tract  pathology. Electronically Signed   By: Nelson Chimes M.D.   On: 11/11/2022 17:57    Procedures Procedures   Medications Ordered in ED Medications - No data to display  ED Course/ Medical Decision Making/ A&P    Medical Decision Making Amount and/or Complexity of Data Reviewed Labs: ordered. Radiology: ordered.  Risk Prescription drug management.  27 year old male presents emerged from today for evaluation of dysuria for the past 2 weeks.  Differential diagnosis includes was not limited to UTI, pyelonephritis, urethritis, nephrolithiasis, epididymitis, urethral stricture, interstitial cystitis.  Vital signs are unremarkable.  Patient normotensive, afebrile, normal pulse rate, satting well on room air without increased work of breathing.  Physical exam as noted above.  On chart evaluation, patient was seen on 10-28-2022 and was given Rocephin and doxycycline for STD coverage.  No urine culture was obtained however his urine did show some leukocytes and bacteria.  I independently reviewed and interpret the patient's labs.  Urinalysis shows  hazy urine with some rare bacteria however no leukocytes, nitrites, or white blood cells seen.  No red blood seen seen as well.  Kayexalate was present however patient does have history of kidney stones.  CBC without leukocytosis or anemia.  CMP shows mildly decreased alk phos at 34 otherwise no electrolyte or LFT abnormality.  Urine culture is in process.  I think the urine culture is needed given the patient's been having dysuria without overt signs of infection.  Patient was complaining of any abdominal pain, however did have some mild tenderness to palpation.  He reports that he was considering this may be kidney stone given he said kidneys in the past however this is not nearly as bad as his previous kidney stones.  Will order CT renal to make sure there is no ureteral stone or pyelonephritis seen.  CT renal study was negative.  Patient does not have  any CVA tenderness as well as has a reassuring CT.  Doubt any pyelonephritis.  Urinalysis is not completely consistent with UTI however does not show any other signs of overt infection.  His GU exam is unremarkable.  Doubt any epididymitis or testicular torsion given he does not have any pain or tenderness.  Likely a UTI.  I discussed with the patient this may be urinary tract infection I will place him on a antibiotic pharmacy twice daily for neck 7 days.  Discussed and stressed the importance of follow-up with urologist for further evaluation.  We discussed tricked return precautions and red flag symptoms.  Patient verbalizes understanding and agrees to the plan.  Patient is stable being discharged home in good condition.  Final Clinical Impression(s) / ED Diagnoses Final diagnoses:  Dysuria    Rx / DC Orders ED Discharge Orders          Ordered    cefUROXime (CEFTIN) 500 MG tablet  2 times daily with meals,   Status:  Discontinued        11/11/22 1818    cefUROXime (CEFTIN) 500 MG tablet  2 times daily with meals        11/11/22 1851              Sherrell Puller, PA-C 11/11/22 [place], [place], DO 11/15/22 [phone]

## 2022-11-11 NOTE — ED Triage Notes (Signed)
Pt states he was recently seen and evaluated for possible STD and given doxycycline for tx. States he's completed the abx course, but still has burning while urinating. Denies any discharge, odor, hematuria.

## 2022-11-12 LAB — URINE CULTURE: Culture: NO GROWTH

## 2022-12-12 ENCOUNTER — Other Ambulatory Visit: Payer: Self-pay

## 2022-12-12 ENCOUNTER — Ambulatory Visit: Payer: Self-pay

## 2022-12-12 ENCOUNTER — Encounter (HOSPITAL_COMMUNITY): Payer: Self-pay

## 2022-12-12 ENCOUNTER — Emergency Department (HOSPITAL_COMMUNITY)
Admission: EM | Admit: 2022-12-12 | Discharge: 2022-12-12 | Disposition: A | Discharge status: Home / Self Care | Payer: Self-pay | Attending: Emergency Medicine | Admitting: Emergency Medicine

## 2022-12-12 DIAGNOSIS — M7989 Other specified soft tissue disorders: Secondary | ICD-10-CM | POA: Insufficient documentation

## 2022-12-12 DIAGNOSIS — R5382 Chronic fatigue, unspecified: Secondary | ICD-10-CM

## 2022-12-12 DIAGNOSIS — R5383 Other fatigue: Secondary | ICD-10-CM | POA: Insufficient documentation

## 2022-12-12 DIAGNOSIS — R600 Localized edema: Secondary | ICD-10-CM | POA: Insufficient documentation

## 2022-12-12 DIAGNOSIS — R0602 Shortness of breath: Secondary | ICD-10-CM | POA: Insufficient documentation

## 2022-12-12 DIAGNOSIS — Z79899 Other long term (current) drug therapy: Secondary | ICD-10-CM | POA: Insufficient documentation

## 2022-12-12 DIAGNOSIS — R630 Anorexia: Secondary | ICD-10-CM | POA: Insufficient documentation

## 2022-12-12 DIAGNOSIS — Z8639 Personal history of other endocrine, nutritional and metabolic disease: Secondary | ICD-10-CM | POA: Insufficient documentation

## 2022-12-12 DIAGNOSIS — E039 Hypothyroidism, unspecified: Secondary | ICD-10-CM | POA: Insufficient documentation

## 2022-12-12 NOTE — ED Triage Notes (Signed)
Fatigue and decreased energy for 7 years. Pt takes vitamin d daily. Pt has not seen PCP in over 2 years. Pt wants to figure out why he is gaining weight and has no energy over the last 7 years.

## 2022-12-12 NOTE — ED Provider Notes (Signed)
[facility] Provider Note   CSN: [id number] Arrival date & time: 12/12/22  1421     History  Chief Complaint  Patient presents with   Fatigue    Keith Lawrence is a 27 y.o. male history of physical growth delay, ADHD, goiter, hypothyroidism, chronic fatigue, asthma who presents the emergency department complaining of fatigue and decreased energy for the past 7 years.  Ongoing shortness of breath, especially with exertion for the past 5 years.  No recent changes to these.  Has been noticing some bilateral leg swelling.  Says that his car recently broke down and he has to take the bus and walk long distances to class.  Has not tried anything for this leg swelling.  Has not seen a primary doctor in over 2 years as he recently moved back here from [place].  Reports he has been gaining weight for many years as well.  Reports previous history of vitamin D deficiency, has been taking over-the-counter supplements since he ran out of his prescription strength vitamin D many years ago.  HPI     Home Medications Prior to Admission medications   Medication Sig Start Date End Date Taking? Authorizing Provider  Ascorbic Acid (VITAMIN C) 1000 MG tablet Take 1,000 mg by mouth daily.    [provider]  B Complex-Folic Acid (B COMPLEX PLUS PO) Take 1 capsule by mouth daily.    [provider]  cefUROXime (CEFTIN) 500 MG tablet Take 1 tablet (500 mg total) by mouth 2 (two) times daily with a meal. 11/11/22   Sherrell Puller, PA-C  doxycycline (VIBRA-TABS) 100 MG tablet Take 1 tablet (100 mg total) by mouth 2 (two) times daily. 10/28/22   Small, Brooke L, PA  levothyroxine (SYNTHROID) 50 MCG tablet Take 50 mcg by mouth daily before breakfast.    [provider]  omeprazole (PRILOSEC) 40 MG capsule Take 1 capsule (40 mg total) by mouth in the morning and at bedtime. 02/01/20   Nicolette Bang, MD      Allergies     Patient has no known allergies.    Review of Systems   Review of Systems  Constitutional:  Positive for fatigue.       Chronic  Respiratory:  Positive for shortness of breath. Negative for cough.        Chronic  Cardiovascular:  Positive for leg swelling. Negative for chest pain and palpitations.  Gastrointestinal:  Negative for nausea and vomiting.  All other systems reviewed and are negative.   Physical Exam Updated Vital Signs BP 125/77   Pulse 83   Temp 98.4 F (36.9 C) (Oral)   Resp 18   Ht '5\' 4"'$  (1.626 m)   Wt 81.6 kg   SpO2 98%   BMI 30.90 kg/m  Physical Exam Vitals and nursing note reviewed.  Constitutional:      Appearance: Normal appearance.  HENT:     Head: Normocephalic and atraumatic.  Eyes:     Conjunctiva/sclera: Conjunctivae normal.  Cardiovascular:     Rate and Rhythm: Normal rate and regular rhythm.     Pulses:          Posterior tibial pulses are 2+ on the right side and 2+ on the left side.     Comments: Bilateral 1+ nonpitting edema Pulmonary:     Effort: Pulmonary effort is normal. No respiratory distress.     Breath sounds: Normal breath sounds.  Abdominal:  General: There is no distension.     Palpations: Abdomen is soft.     Tenderness: There is no abdominal tenderness.  Musculoskeletal:     Right lower leg: Edema present.     Left lower leg: Edema present.  Skin:    General: Skin is warm and dry.     Capillary Refill: Capillary refill takes less than 2 seconds.     Comments: No overlying skin changes to the lower extremities  Neurological:     General: No focal deficit present.     Mental Status: He is alert.     Comments: Sensation intact in bilateral lower extremities     ED Results / Procedures / Treatments   Labs (all labs ordered are listed, but only abnormal results are displayed) Labs Reviewed - No data to display  EKG None  Radiology No results found.  Procedures Procedures    Medications Ordered in  ED Medications - No data to display  ED Course/ Medical Decision Making/ A&P                             Medical Decision Making  This patient is a 27 y.o. male who presents to the ED for concern of fatigue x 7 years.   Differential diagnoses prior to evaluation: Chronic fatigue, vitamin deficiencies, malnutrition, lyme. This is not an exhaustive differential.   Past Medical History / Social History / Additional history: Chart reviewed. Pertinent results include: physical growth delay, ADHD, goiter, hypothyroidism, chronic fatigue, asthma  Physical Exam: Physical exam performed. The pertinent findings include: Normal vital signs, no acute distress.  Heart regular rate and rhythm, lung sounds clear.  No increased respiratory effort normal oxygen saturation on room air.  Bilateral 1+ nonpitting edema to the legs.  Normal range of motion of the bilateral lower extremities with station intact.  Strong distal pulses.  Disposition: After consideration of the diagnostic results and the patients response to treatment, I feel that emergency department workup does not suggest an emergent condition requiring admission or immediate intervention beyond what has been performed at this time. The plan is: Discharged home with PCP follow-up.  I have very low concern for emergent etiology contributing to patient's symptoms today.  While I did consider DVT as a cause for his leg swelling, this has been going on for a significant mount of time, and seems to be affecting both legs instead of just 1.  Edema is nonpitting and he shows no other signs of fluid retention, so I very low concern for new onset heart failure.  This would also be very rare with his age and his overall unremarkable medical history.  Will provide follow-up for [facility] for him to follow-up and have a comprehensive evaluation of his chronic symptoms. The patient is safe for discharge and has been instructed to return  immediately for worsening symptoms, change in symptoms or any other concerns.  Final Clinical Impression(s) / ED Diagnoses Final diagnoses:  Chronic fatigue  Leg swelling  History of vitamin D deficiency    Rx / DC Orders ED Discharge Orders     None      Portions of this report may have been transcribed using voice recognition software. Every effort was made to ensure accuracy; however, inadvertent computerized transcription errors may be present.    Estill Cotta 12/12/22 1602    Fredia Sorrow, MD 12/12/22 2207

## 2022-12-12 NOTE — Discharge Instructions (Addendum)
You were seen in the ER for fatigue and leg swelling.  I think your symptoms could be made worse with the increase in physical activity you've been doing recently since your car broke down.   I reviewed your labs from last month and they looked reassuring, but I think you need a comprehensive workup with a primary care provider. I've attached the contact information for [facility] for you to call and make a follow up appointment.   For your legs, I recommend wearing compression stockings, taking over the counter anti-inflammatories (ibuprofen, tylenol), and decreasing the amount of salt in your diet.   Continue to monitor how you're doing and return to the ER for new or worsening symptoms.

## 2022-12-12 NOTE — Telephone Encounter (Signed)
  Chief Complaint: Fatigue - SOB - leg swelling Symptoms: above Frequency: 5 years Pertinent Negatives: Patient denies  Disposition: '[]'$ ED /'[]'$ Urgent Care (no appt availability in office) / '[x]'$ Appointment(In office/virtual)/ '[]'$  [facility]/ '[]'$ Home Care/ '[]'$ Refused Recommended Disposition /'[]'$ [facility]/ '[]'$  Follow-up with PCP Additional Notes: Pt was just released form ED for SOB, fatigue and swollen ankles. Pt was told to follow up with [facility]. Pt states that these issues have been going on for 5-7 years. Pt states he has low vitamin D. Made hospital follow up appt. I called community care clinics but phones are not answered after 4:30 for 2 of the clinics, and the third clinic did not answer. Gave pt phone number for 1 clinic as he had to hang up phone. PT may call back for numbers to other clinics.     Reason for Disposition  [1] MILD longstanding difficulty breathing AND [2]  SAME as normal  Answer Assessment - Initial Assessment Questions 1. RESPIRATORY STATUS: "Describe your breathing?" (e.g., wheezing, shortness of breath, unable to speak, severe coughing)      Shortness of breath 2. ONSET: "When did this breathing problem begin?"      Years ago, ut getting worse.  3. PATTERN "Does the difficult breathing come and go, or has it been constant since it started?"      ongoing 4. SEVERITY: "How bad is your breathing?" (e.g., mild, moderate, severe)    - MILD: No SOB at rest, mild SOB with walking, speaks normally in sentences, can lie down, no retractions, pulse < 100.    - MODERATE: SOB at rest, SOB with minimal exertion and prefers to sit, cannot lie down flat, speaks in phrases, mild retractions, audible wheezing, pulse 100-120.    - SEVERE: Very SOB at rest, speaks in single words, struggling to breathe, sitting hunched forward, retractions, pulse > 120      Mild 5. RECURRENT SYMPTOM: "Have you had difficulty breathing before?" If Yes, ask: "When was the last time?"  and "What happened that time?"      Yes - ongoing 6. CARDIAC HISTORY: "Do you have any history of heart disease?" (e.g., heart attack, angina, bypass surgery, angioplasty)       7. LUNG HISTORY: "Do you have any history of lung disease?"  (e.g., pulmonary embolus, asthma, emphysema)     asthma 8. CAUSE: "What do you think is causing the breathing problem?"      unsure 9. OTHER SYMPTOMS: "Do you have any other symptoms? (e.g., dizziness, runny nose, cough, chest pain, fever)     fatigue  Protocols used: Breathing Difficulty-A-AH

## 2022-12-23 ENCOUNTER — Ambulatory Visit: Payer: Self-pay | Admitting: Student

## 2022-12-23 NOTE — Progress Notes (Deleted)
Subjective:  Patient ID: Keith Lawrence, male    DOB: 01-18-1996, 27 y.o.   MRN: [id number]  CC: New Patient  HPI:  Keith Lawrence is a very pleasant 26 y.o. male who presents today to establish care.  FATIGUE TSH nml 01/2020 Fatigue started:  *** The Tiredness is described as *** Feels like doing activities but can't: *** Does not feel like doing things: *** Feels the fatigue is due to: ***  Symptoms Fever: *** Sweating at night: *** Weight Loss: *** Shortness of Breath: *** Coughing up Blood: *** Muscle Pain or Weakness: *** Black or bloody Stools: *** Severe Snoring or Daytime Sleepiness: *** Feeling Down: *** Not enjoying things: *** Rash: *** Leg or Joint Swelling: *** Chest Pain or irregular heart beat:  ***       PMHx: Past Medical History:  Diagnosis Date   Abdominal pain    all over pain   ADHD (attention deficit hyperactivity disorder)    Asthma    Fatigue    Goiter    Hypothyroidism, acquired, autoimmune    Iron excess    Isosexual precocity    Nausea and vomiting    Physical growth delay    SOB (shortness of breath) on exertion    Thyroiditis, autoimmune     Surgical Hx: Past Surgical History:  Procedure Laterality Date   CHALAZION EXCISION     ESOPHAGOGASTRODUODENOSCOPY  03/19/2016   per Dr. Loletha Carrow, normal    FRENULECTOMY, LINGUAL     TONSILLECTOMY AND ADENOIDECTOMY      Family Hx: Family History  Problem Relation Age of Onset   Thyroid disease Maternal Grandmother    Heart disease Maternal Grandmother    Thyroid disease Paternal Aunt    Heart disease Paternal Grandfather    Stroke Paternal Uncle    Diabetes Neg Hx    Colon cancer Neg Hx     Social Hx: Current Social History   Who lives at home: *** 12/23/2022  Who would speak for you about health care matters: *** 12/23/2022  Transportation: *** 12/23/2022 Important Relationships & Pets: *** 12/23/2022  Current Stressors: *** 12/23/2022 Work / Education:  ***  12/23/2022 Religious / Personal Beliefs: *** 12/23/2022 Interests / Fun: *** 12/23/2022 Other: *** 12/23/2022   Medications:  Preventative Screening Colonoscopy: year*** results *** Mammogram: year*** results *** Pap test: year*** results *** PSA: year*** results *** DEXA: year*** results *** Tetanus vaccine: year*** results *** Pneumonia vaccine: year*** results *** Shingles vaccine: year*** results *** Heart stress test: year*** results *** Echocardiogram: year*** results *** Xrays: year*** results *** CT/MRI: year*** results ***  Smoking status reviewed  ROS: pertinent noted in the HPI   Objective:  There were no vitals taken for this visit. Vitals and nursing note reviewed  General: NAD, pleasant, able to participate in exam HEENT: normocephalic, TM's visualized bilaterally, no scleral icterus or conjunctival pallor, no nasal discharge, moist mucous membranes, good dentition without erythema or discharge noted in posterior oropharynx Neck: supple, non-tender, without lymphadenopathy Cardiac: RRR, S1 S2 present. normal heart sounds, no murmurs. Respiratory: CTAB, normal effort, No wheezes, rales or rhonchi Abdomen: Normoactive bowel sounds, non-tender, non-distended, no hepatosplenomegaly Extremities: no edema or cyanosis. Skin: warm and dry, no rashes noted Neuro: alert, no obvious focal deficits Psych: Normal affect and mood  Assessment & Plan:  There are no diagnoses linked to this encounter.  The most likely diagnosis is ***   I have considered and concluded the patient has a very low  likelihood of having: Sleep apnea as the patient does not fall asleep during the day or feel as if they are having difficulty sleeping throughout the night and do not have evidence of high mallampati scoring ***, Substance cause (beta blocker, clonidine, alcohol, carbon monoxide),  Neurologic cause (ALS, MS, Guillain-Barre, Parkinsons, Myasthenia Gravis) without evidence of abnormalities  on neurologic exam or evidence of acute neurologic red flags,  Infections (mono, hepatitis, tuberculosis, endocarditis, HIV, CMV, Lyme, Fungal (Histo/Coccido)) without systemic symptoms, Pregnancy, Heart failure, Myocardial ischemia, Celiac disease, Cancer, lymphoma, Polymyalgia rheumatica, Adrenal insufficiency, Hypopituitarism, Post-Lyme disease. For the patient we will check basic labs CBC, CMP, TSH, CK if muscle weakness or pain is associated. Patient will need HIV and Hep C testing as well. No evidence of depression with PHQ9 of ***.   No follow-ups on file. Erskine Emery, MD 12/23/2022, 11:18 AM PGY-2, [facility]

## 2023-01-01 ENCOUNTER — Ambulatory Visit: Payer: Self-pay | Attending: Physician Assistant | Admitting: Physician Assistant

## 2023-01-01 NOTE — Progress Notes (Deleted)
Patient ID: Keith Lawrence, male   DOB: August 19, 1996, 27 y.o.   MRN: [id number]   After going to the ED 12/11/2021  From ED note: Keith Lawrence is a 27 y.o. male history of physical growth delay, ADHD, goiter, hypothyroidism, chronic fatigue, asthma who presents the emergency department complaining of fatigue and decreased energy for the past 7 years.  Ongoing shortness of breath, especially with exertion for the past 5 years.  No recent changes to these.  Has been noticing some bilateral leg swelling.  Says that his car recently broke down and he has to take the bus and walk long distances to class.  Has not tried anything for this leg swelling.  Has not seen a primary doctor in over 2 years as he recently moved back here from [place].  Reports he has been gaining weight for many years as well.  Reports previous history of vitamin D deficiency, has been taking over-the-counter supplements since he ran out of his prescription strength vitamin D many years ago.    Disposition: After consideration of the diagnostic results and the patients response to treatment, I feel that emergency department workup does not suggest an emergent condition requiring admission or immediate intervention beyond what has been performed at this time. The plan is: Discharged home with PCP follow-up.  I have very low concern for emergent etiology contributing to patient's symptoms today.  While I did consider DVT as a cause for his leg swelling, this has been going on for a significant mount of time, and seems to be affecting both legs instead of just 1.  Edema is nonpitting and he shows no other signs of fluid retention, so I very low concern for new onset heart failure.

## 2023-04-12 ENCOUNTER — Encounter: Payer: BLUE CROSS/BLUE SHIELD | Admitting: Nurse Practitioner

## 2023-04-12 ENCOUNTER — Telehealth: Payer: BLUE CROSS/BLUE SHIELD | Admitting: Nurse Practitioner

## 2023-04-12 DIAGNOSIS — N4889 Other specified disorders of penis: Secondary | ICD-10-CM

## 2023-04-12 DIAGNOSIS — R5383 Other fatigue: Secondary | ICD-10-CM | POA: Diagnosis not present

## 2023-04-12 DIAGNOSIS — N941 Unspecified dyspareunia: Secondary | ICD-10-CM

## 2023-04-12 DIAGNOSIS — J029 Acute pharyngitis, unspecified: Secondary | ICD-10-CM | POA: Diagnosis not present

## 2023-04-12 NOTE — Progress Notes (Signed)
E-Visit for Sore Throat  We are sorry that you are not feeling well.  Here is how we plan to help!  Pain during sex and fatigue needs to be evaluated by face to face visit.  Your symptoms indicate a likely viral infection (Pharyngitis).   Pharyngitis is inflammation in the back of the throat which can cause a sore throat, scratchiness and sometimes difficulty swallowing.   Pharyngitis is typically caused by a respiratory virus and will just run its course.  Please keep in mind that your symptoms could last up to 10 days.  For throat pain, we recommend over the counter oral pain relief medications such as acetaminophen or aspirin, or anti-inflammatory medications such as ibuprofen or naproxen sodium.  Topical treatments such as oral throat lozenges or sprays may be used as needed.  Avoid close contact with loved ones, especially the very young and elderly.  Remember to wash your hands thoroughly throughout the day as this is the number one way to prevent the spread of infection and wipe down door knobs and counters with disinfectant.  After careful review of your answers, I would not recommend an antibiotic for your condition.  Antibiotics should not be used to treat conditions that we suspect are caused by viruses like the virus that causes the common cold or flu. However, some people can have Strep with atypical symptoms. You may need formal testing in clinic or office to confirm if your symptoms continue or worsen.  Providers prescribe antibiotics to treat infections caused by bacteria. Antibiotics are very powerful in treating bacterial infections when they are used properly.  To maintain their effectiveness, they should be used only when necessary.  Overuse of antibiotics has resulted in the development of super bugs that are resistant to treatment!    Home Care: Only take medications as instructed by your medical team. Do not drink alcohol while taking these medications. A steam or ultrasonic  humidifier can help congestion.  You can place a towel over your head and breathe in the steam from hot water coming from a faucet. Avoid close contacts especially the very young and the elderly. Cover your mouth when you cough or sneeze. Always remember to wash your hands.  Get Help Right Away If: You develop worsening fever or throat pain. You develop a severe head ache or visual changes. Your symptoms persist after you have completed your treatment plan.  Make sure you Understand these instructions. Will watch your condition. Will get help right away if you are not doing well or get worse.   Thank you for choosing an e-visit.  Your e-visit answers were reviewed by a board certified advanced clinical practitioner to complete your personal care plan. Depending upon the condition, your plan could have included both over the counter or prescription medications.  Please review your pharmacy choice. Make sure the pharmacy is open so you can pick up prescription now. If there is a problem, you may contact your provider through [employer] and have the prescription routed to another pharmacy.  Your safety is important to [place]. If you have drug allergies check your prescription carefully.   For the next 24 hours you can use MyChart to ask questions about today's visit, request a non-urgent call back, or ask for a work or school excuse. You will get an email in the next two days asking about your experience. I hope that your e-visit has been valuable and will speed your recovery.  Keith Daphine Deutscher, FNP  5-10 minutes spent reviewing and documenting in chart.

## 2023-04-12 NOTE — Progress Notes (Signed)
Erroneous encounter- patient did evisit as well and was responded to. Needs FACE to face visit for painful intercourse and fatugue. His sore throat was addressed In evisit.  Mary-Margaret Daphine Deutscher, FNP

## 2023-04-13 ENCOUNTER — Ambulatory Visit
Admission: RE | Admit: 2023-04-13 | Discharge: 2023-04-13 | Disposition: A | Discharge status: Home / Self Care | Payer: BLUE CROSS/BLUE SHIELD | Source: Ambulatory Visit | Attending: Urgent Care | Admitting: Urgent Care

## 2023-04-13 VITALS — BP 109/74 | HR 71 | Temp 98.5°F | Resp 18

## 2023-04-13 DIAGNOSIS — R300 Dysuria: Secondary | ICD-10-CM | POA: Diagnosis present

## 2023-04-13 DIAGNOSIS — B356 Tinea cruris: Secondary | ICD-10-CM | POA: Insufficient documentation

## 2023-04-13 DIAGNOSIS — N4889 Other specified disorders of penis: Secondary | ICD-10-CM | POA: Insufficient documentation

## 2023-04-13 DIAGNOSIS — J029 Acute pharyngitis, unspecified: Secondary | ICD-10-CM | POA: Diagnosis not present

## 2023-04-13 LAB — POCT URINALYSIS DIP (MANUAL ENTRY)
Bilirubin, UA: NEGATIVE
Blood, UA: NEGATIVE
Glucose, UA: NEGATIVE mg/dL
Ketones, POC UA: NEGATIVE mg/dL
Leukocytes, UA: NEGATIVE
Nitrite, UA: NEGATIVE
Spec Grav, UA: 1.02 (ref 1.010–1.025)
Urobilinogen, UA: 0.2 E.U./dL
pH, UA: 7.5 (ref 5.0–8.0)

## 2023-04-13 MED ORDER — MICONAZOLE NITRATE 2 % EX CREA: 1.0000 | TOPICAL_CREAM | Freq: Two times a day (BID) | CUTANEOUS | 0 refills | Status: DC

## 2023-04-13 NOTE — ED Triage Notes (Signed)
Pt c/o body aches, cough, sore throat, penile pain with intercourse.   Started: Wednesday   Home interventions: tylenol

## 2023-04-13 NOTE — Discharge Instructions (Addendum)
Your sore throat and respiratory symptoms are consistent with a viral infection.  We have obtained a throat swab to rule out a bacterial sexually-transmitted infection.  Your urine does not show any sign of urinary tract infection.  We obtained a urethral swab to rule out a bacterial sexually transmitted infection.  Results of today's testing will be posted to your MyChart account when available.  If there is a positive result requiring treatment, you will be contacted by telephone.  To treat your skin infection:  Twice a day-  1.  Wash the area with soap and water.  Dry completely. 2.  Apply antifungal cream.  Rub in completely and allow to dry completely. 3.  Apply medicated powder such as [place].  Every time you urinate, or frequently throughout the day, apply medicated powder to ensure the area stays dry.

## 2023-04-13 NOTE — ED Provider Notes (Signed)
[facility]    CSN: [id number] Arrival date & time: 04/13/23  1136      History   Chief Complaint No chief complaint on file.   HPI Keith Lawrence is a 27 y.o. male.   HPI  Presents to urgent care with complaint of symptoms x 4 days.  He endorses penile pain with intercourse, sore throat, body aches, cough.   Denies fever.  Denies discharge from his penis.  Endorses burning pain with urination.  Patient endorses sex with male.  Vaginal and oral.  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 Myrtie Neither, normal    FRENULECTOMY, LINGUAL     TONSILLECTOMY AND ADENOIDECTOMY         Home Medications    Prior to Admission medications   Medication Sig Start Date End Date Taking? Authorizing Provider  Ascorbic Acid (VITAMIN C) 1000 MG tablet Take 1,000 mg by mouth daily.    [provider]  B Complex-Folic Acid (B COMPLEX PLUS PO) Take 1 capsule by mouth daily.    [provider]  cefUROXime (CEFTIN) 500 MG tablet Take 1 tablet (500 mg total) by mouth 2 (two) times  daily with a meal. 11/11/22   Achille Rich, PA-C  doxycycline (VIBRA-TABS) 100 MG tablet Take 1 tablet (100 mg total) by mouth 2 (two) times daily. 10/28/22   Small, Brooke L, PA  levothyroxine (SYNTHROID) 50 MCG tablet Take 50 mcg by mouth daily before breakfast.    [provider]  omeprazole (PRILOSEC) 40 MG capsule Take 1 capsule (40 mg total) by mouth in the morning and at bedtime. 02/01/20   Arvilla Market, MD    Family History Family History  Problem Relation Age of Onset   Thyroid disease Maternal Grandmother    Heart disease Maternal Grandmother    Thyroid disease Paternal Aunt    Heart disease Paternal Grandfather    Stroke Paternal Uncle    Diabetes Neg Hx    Colon cancer Neg Hx     Social History Social History   Tobacco Use   Smoking status: Former    Packs/day: 1.00    Years: 7.00    Additional pack years: 0.00    Total pack years: 7.00    Types: Cigarettes    Start date: 01/11/2012    Quit date: 03/09/2014    Years since quitting: 9.1   Smokeless tobacco: Never  Substance Use Topics   Alcohol use: Yes    Alcohol/week: 0.0 standard  drinks of alcohol   Drug use: Yes    Types: Other-see comments, Marijuana    Comment: Stopped 5 months ago     Allergies   Patient has no known allergies.   Review of Systems Review of Systems   Physical Exam Triage Vital Signs ED Triage Vitals [04/13/23 1235]  Enc Vitals Group     BP 109/74     Pulse Rate 71     Resp 18     Temp 98.5 F (36.9 C)     Temp Source Oral     SpO2 97 %     Weight      Height      Head Circumference      Peak Flow      Pain Score      Pain Loc      Pain Edu?      Excl. in GC?    No data found.  Updated Vital Signs BP 109/74 (BP Location: Left Arm)   Pulse 71   Temp 98.5 F (36.9 C) (Oral)   Resp 18   SpO2 97%   Visual Acuity Right Eye Distance:   Left Eye Distance:   Bilateral Distance:    Right Eye Near:   Left Eye Near:    Bilateral Near:      Physical Exam Vitals reviewed.  Constitutional:      Appearance: Normal appearance.  HENT:     Mouth/Throat:     Mouth: Mucous membranes are moist.     Pharynx: No oropharyngeal exudate or posterior oropharyngeal erythema.  Cardiovascular:     Rate and Rhythm: Normal rate and regular rhythm.     Pulses: Normal pulses.     Heart sounds: Normal heart sounds.  Pulmonary:     Effort: Pulmonary effort is normal.     Breath sounds: Normal breath sounds.  Skin:    General: Skin is warm and dry.  Neurological:     General: No focal deficit present.     Mental Status: He is alert and oriented to person, place, and time.  Psychiatric:        Mood and Affect: Mood normal.        Behavior: Behavior normal.      UC Treatments / Results  Labs (all labs ordered are listed, but only abnormal results are displayed) Labs Reviewed - No data to display  EKG   Radiology No results found.  Procedures Procedures (including critical care time)  Medications Ordered in UC Medications - No data to display  Initial Impression / Assessment and Plan / UC Course  I have reviewed the triage vital signs and the nursing notes.  Pertinent labs & imaging results that were available during my care of the patient were reviewed by me and considered in my medical decision making (see chart for details).   Keith Lawrence is a 27 y.o. male presenting with dysuria. Patient is afebrile without recent antipyretics, satting well on room air. Overall is well appearing, well hydrated, without respiratory distress. Pulmonary exam is unremarkable.  Lungs CTAB without wheezing, rhonchi, rales. RRR without murmurs, rubs, gallops.  There is no pharyngeal erythema or peritonsillar exudate.  Reviewed relevant chart history.   Urethral and oral cytology swabs were obtained based on his symptoms.  Results pending.  UA is not indicative of urinary tract infection.  Respiratory symptoms are consistent with  acute viral process and will recommend supportive care and use of over-the-counter medication while awaiting results  of cytology.  Miconazole is prescribed for tinea cruris infection reported by the patient.  Detailed instructions for treatment are provided in patient instructions.  Counseled patient on potential for adverse effects with medications prescribed/recommended today, ER and return-to-clinic precautions discussed, patient verbalized understanding and agreement with care plan.  Final Clinical Impressions(s) / UC Diagnoses   Final diagnoses:  None   Discharge Instructions   None    ED Prescriptions   None    PDMP not reviewed this encounter.   Charma Igo, [place] 04/13/23 1330

## 2023-04-14 LAB — CYTOLOGY, (ORAL, ANAL, URETHRAL) ANCILLARY ONLY
Chlamydia: NEGATIVE
Chlamydia: NEGATIVE
Comment: NEGATIVE
Comment: NEGATIVE
Comment: NEGATIVE
Comment: NEGATIVE
Comment: NORMAL
Comment: NORMAL
Neisseria Gonorrhea: NEGATIVE
Neisseria Gonorrhea: NEGATIVE
Trichomonas: NEGATIVE
Trichomonas: NEGATIVE

## 2023-05-06 ENCOUNTER — Telehealth: Payer: BLUE CROSS/BLUE SHIELD | Admitting: Nurse Practitioner

## 2023-05-06 DIAGNOSIS — S40269A Insect bite (nonvenomous) of unspecified shoulder, initial encounter: Secondary | ICD-10-CM | POA: Diagnosis not present

## 2023-05-06 DIAGNOSIS — W57XXXA Bitten or stung by nonvenomous insect and other nonvenomous arthropods, initial encounter: Secondary | ICD-10-CM | POA: Diagnosis not present

## 2023-05-07 MED ORDER — DOXYCYCLINE HYCLATE 100 MG PO TABS: 100.0000 mg | ORAL_TABLET | Freq: Two times a day (BID) | ORAL | 0 refills | Status: AC

## 2023-05-07 NOTE — Progress Notes (Signed)
E-Visit for Tick Bite   Thank you for describing your tick bite, Here is how we plan to help! Based on the information that you shared with me it looks like you have An infected or complicated tick bite that requires a longer course of antibiotics and will need for you to schedule a follow-up visit with a provider.   In most cases a tick bite is painless and does not itch.  Most tick bites in which the tick is quickly removed do not require prescriptions. Ticks can transmit several diseases if they are infected and remain attacked to your skin. Therefore the length that the tick was attached and any symptoms you have experienced after the bite are import to accurately develop your custom treatment plan. In most cases a single dose of doxycycline may prevent the development of a more serious condition.   Based on your information I have Provided a home care guide for tick bites and  instructions on when to call for help. and Your symptoms indicate that you need a longer course of antibiotics and a follow up visit with a provider. I have sent doxycycline 100 mg twice a day for 21 days to the pharmacy that you selected. You will need to schedule a follow up visit with your provider. If you do not have a primary care provider you may use our telehealth physicians on the web at [email or web address]   Which ticks  are associated with illness?   The Wood Tick (dog tick) is the size of a watermelon seed and can sometimes transmit Rocky Mountain spotted fever and [place] tick fever.    The Deer Tick (black-legged tick) is between the size of a poppy seed (pin head) and an apple seed, and can sometimes transmit Lyme disease.   A brown to black tick with a white splotch on its back is likely a male Amblyomma americanum ([place] Star tick). This tick has been associated with Southern Tick Associated illness ( STARI)   Lyme disease has become the most common tick-borne illness in the [place]. The risk of  Lyme disease following a recognized deer tick bite is estimated to be 1%.  The majority of cases of Lyme disease start with a bull's eye rash at the site of the tick bite. The rash can occur days to weeks (typically 7-10 days) after a tick bite. Treatment with antibiotics is indicated if this rash appears. Flu-like symptoms may accompany the rash, including: fever, chills, headaches, muscle aches, and fatigue. Removing ticks promptly may prevent tick borne disease.   What can be used to prevent Tick Bites?   Insect repellant with at leas 20% DEET. Wearing long pants with sock and shoes. Avoiding tall grass and heavily wooded areas. Checking your skin after being outdoors. Shower with a washcloth after outdoor exposures.   HOME CARE ADVICE FOR TICK BITE   Wood Tick Removal:  Use a pair of tweezers and grasp the wood tick close to the skin (on its head). Pull the wood tick straight upward without twisting or crushing it. Maintain a steady pressure until it releases its grip.   If tweezers aren't available, use fingers, a loop of thread around the jaws, or a needle between the jaws for traction.  Note: covering the tick with petroleum jelly, nail polish or rubbing alcohol doesn't work. Neither does touching the tick with a hot or cold object. Tiny Deer Tick Removal:   Needs to be scraped off with a knife blade or   credit card edge. Place tick in a sealed container (e.g. glass jar, zip lock plastic bag), in case your doctor wants to see it. Tick's Head Removal:  If the wood tick's head breaks off in the skin, it must be removed. Clean the skin. Then use a sterile needle to uncover the head and lift it out or scrape it off.  If a very small piece of the head remains, the skin will eventually slough it off. Antibiotic Ointment:  Wash the wound and your hands with soap and water after removal to prevent catching any tick disease.  Apply an over the counter antibiotic ointment (e.g. bacitracin) to  the bite once. Expected Course: Tick bites normally don't itch or hurt. That's why they often go unnoticed. Call Your Doctor If:  You can't remove the tick or the tick's head Fever, a severe head ache, or rash occur in the next 2 weeks Bite begins to look infected Lyme's disease is common in your area You have not had a tetanus in the last 10 years Your current symptoms become worse      MAKE SURE YOU  Understand these instructions. Will watch your condition. Will get help right away if you are not doing well or get worse.       Thank you for choosing an e-visit.   Your e-visit answers were reviewed by a board certified advanced clinical practitioner to complete your personal care plan. Depending upon the condition, your plan could have included both over the counter or prescription medications.   Please review your pharmacy choice. Make sure the pharmacy is open so you can pick up prescription now. If there is a problem, you may contact your provider through MyChart messaging and have the prescription routed to another pharmacy.  Your safety is important to us. If you have drug allergies check your prescription carefully.    For the next 24 hours you can use MyChart to ask questions about today's visit, request a non-urgent call back, or ask for a work or school excuse. You will get an email in the next two days asking about your experience. I hope that your e-visit has been valuable and will speed your recovery.      Meds ordered this encounter  Medications   doxycycline (VIBRA-TABS) 100 MG tablet    Sig: Take 1 tablet (100 mg total) by mouth 2 (two) times daily for 21 days.    Dispense:  42 tablet    Refill:  0     I spent approximately 5 minutes reviewing the patient's history, current symptoms and coordinating their care today.   

## 2023-05-26 ENCOUNTER — Other Ambulatory Visit: Payer: Self-pay | Admitting: Nurse Practitioner

## 2023-05-26 DIAGNOSIS — W57XXXA Bitten or stung by nonvenomous insect and other nonvenomous arthropods, initial encounter: Secondary | ICD-10-CM

## 2023-12-04 ENCOUNTER — Other Ambulatory Visit (HOSPITAL_BASED_OUTPATIENT_CLINIC_OR_DEPARTMENT_OTHER): Payer: Self-pay | Admitting: Family Medicine

## 2023-12-04 ENCOUNTER — Ambulatory Visit (INDEPENDENT_AMBULATORY_CARE_PROVIDER_SITE_OTHER): Payer: BLUE CROSS/BLUE SHIELD | Admitting: Family Medicine

## 2023-12-04 ENCOUNTER — Encounter (HOSPITAL_BASED_OUTPATIENT_CLINIC_OR_DEPARTMENT_OTHER): Payer: Self-pay | Admitting: Family Medicine

## 2023-12-04 VITALS — BP 109/72 | HR 84 | Ht 64.0 in | Wt 160.0 lb

## 2023-12-04 DIAGNOSIS — K219 Gastro-esophageal reflux disease without esophagitis: Secondary | ICD-10-CM

## 2023-12-04 DIAGNOSIS — E559 Vitamin D deficiency, unspecified: Secondary | ICD-10-CM

## 2023-12-04 DIAGNOSIS — R0683 Snoring: Secondary | ICD-10-CM

## 2023-12-04 DIAGNOSIS — M791 Myalgia, unspecified site: Secondary | ICD-10-CM | POA: Insufficient documentation

## 2023-12-04 DIAGNOSIS — R5382 Chronic fatigue, unspecified: Secondary | ICD-10-CM

## 2023-12-04 DIAGNOSIS — Z8639 Personal history of other endocrine, nutritional and metabolic disease: Secondary | ICD-10-CM

## 2023-12-04 MED ORDER — PANTOPRAZOLE SODIUM 40 MG PO TBEC: 40.0000 mg | DELAYED_RELEASE_TABLET | Freq: Every day | ORAL | 3 refills | Status: AC

## 2023-12-04 MED ORDER — OMEPRAZOLE 40 MG PO CPDR: 40.0000 mg | DELAYED_RELEASE_CAPSULE | Freq: Every day | ORAL | 3 refills | Status: DC

## 2023-12-04 NOTE — Telephone Encounter (Signed)
 Alexis, please advise on alternate medication.

## 2023-12-04 NOTE — Progress Notes (Signed)
 New Patient Office Visit  Subjective:   Keith Lawrence 09/09/1996 12/04/2023  Chief Complaint  Patient presents with   New Patient (Initial Visit)    Patient is here today to get established with the practice. States he has been having problems feeling weak for the past 8 years and has been told that his Vitamin D has been low.    HPI: Keith Lawrence presents today to establish care at [facility] at [facility]. Introduced to Publishing rights manager role and practice setting.  All questions answered.   Last PCP: Keith Siren, MD Last annual physical: 2021 Concerns: See below   Patient is a 28 year old with a hx of hypothyroidism, goiter, physical growth delay, vitamin d deficiecy.  Patient reports he was managed by endocrinology during childhood due to delayed physical growth likely due to Ritalin and Strattera use per patient.  He was not provided with growth hormone or puberty hormonal support.  Patient is currently concerned about loss of muscle and chronic muscle pain that has been going on for several years.  He reports his mom had a "low co-Q10 level" and would like to get this drawn with lab work.  He also has been having significant generalized fatigue ongoing for several months and has difficulty with sleeping.  He reports he does sleep for 8 to 9 hours and still does not feel rested when he wakes up in the morning.  He does report a history of snoring.  He denies recent accident or injury, joint pains, swelling, rash. Denies family hx of adrenal insuffiency or Cushings. Denies family hx of autoimmmune conditions.    HYPOTHYROIDISM: Patient presents for the medical management of Hypothyroidism. Current medication: None (Patient is not taking prevoiusly prescribed Levothyroxine . He was started on medication at 10 or 11.)   Fatigue: yes Cold intolerance: no Weight gain/loss: no Constipation: no Lower extremity  edema: no Palpitations: no Hoarseness: no Neck Pain/Compression: no Difficulty Swallowing: no  Lab Results  Component Value Date   TSH 2.820 02/01/2020    GERD: Keith Lawrence presents for the medical management of GERD. Reports burning symptoms in chest and abdomen at times. He previously had vomiting and burning abdominal pain in the past which has resolved.  Patient currently taking omeprazole 20 mg daily without relief.  Heartburn frequency: Daily Heartburn duration: Lasting several hours and into the night Alleviatiating factors: None Aggravating factors: Certain foods Previous GERD medications: Omeprazole  Abdominal pain: No Dysphagia: No Nausea/Vomiting: No Hematemesis: No Blood in stool: No Alcohol Use: None  Recent EGD: No   The following portions of the patient's history were reviewed and updated as appropriate: past medical history, past surgical history, family history, social history, allergies, medications, and problem list.   Patient Active Problem List   Diagnosis Date Noted   Myalgia 12/04/2023   Chronic fatigue 12/04/2023   Snoring 12/04/2023   History of hypothyroidism 12/04/2023   Vitamin D deficiency 08/23/2022   Gastroesophageal reflux disease 03/04/2018   Thyroid activity decreased 01/31/2016   HSV (herpes simplex virus) infection 10/13/2013   Physical growth delay    Goiter    Hypothyroidism, acquired, autoimmune    Thyroiditis, autoimmune    Other specified acquired hypothyroidism 02/28/2011   Past Medical History:  Diagnosis Date   Abdominal pain    all over pain   ADHD (attention deficit hyperactivity disorder)    Asthma    Fatigue    Goiter  Hypothyroidism, acquired, autoimmune    Iron excess    Isosexual precocity    Nausea and vomiting    Physical growth delay    SOB (shortness of breath) on exertion    Thyroiditis, autoimmune    Past Surgical History:  Procedure Laterality Date   CHALAZION EXCISION      ESOPHAGOGASTRODUODENOSCOPY  03/19/2016   per Dr. Myrtie Neither, normal    FRENULECTOMY, LINGUAL     TONSILLECTOMY AND ADENOIDECTOMY     Family History  Problem Relation Age of Onset   Thyroid disease Maternal Grandmother    Heart disease Maternal Grandmother    Thyroid disease Paternal Aunt    Heart disease Paternal Grandfather    Stroke Paternal Uncle    Diabetes Neg Hx    Colon cancer Neg Hx    Social History   Socioeconomic History   Marital status: Single    Spouse name: Not on file   Number of children: Not on file   Years of education: Not on file   Highest education level: Not on file  Occupational History   Not on file  Tobacco Use   Smoking status: Former    Current packs/day: 0.00    Average packs/day: 1 pack/day for 7.0 years (7.0 ttl pk-yrs)    Types: Cigarettes    Start date: 01/11/2012    Quit date: 03/09/2014    Years since quitting: 9.7   Smokeless tobacco: Never  Substance and Sexual Activity   Alcohol use: Yes    Alcohol/week: 0.0 standard drinks of alcohol   Drug use: Yes    Types: Other-see comments, Marijuana    Comment: Stopped 5 months ago   Sexual activity: Yes  Other Topics Concern   Not on file  Social History Narrative   Lives with foster parents. Mom still has guardianship.  [employer].    Social Drivers of Corporate investment banker Strain: Low Risk  (12/04/2023)   Overall Financial Resource Strain (CARDIA)    Difficulty of Paying Living Expenses: Not hard at all  Food Insecurity: No Food Insecurity (12/04/2023)   Hunger Vital Sign    Worried About Running Out of Food in the Last Year: Never true    Ran Out of Food in the Last Year: Never true  Transportation Needs: No Transportation Needs (12/04/2023)   PRAPARE - Administrator, Civil Service (Medical): No    Lack of Transportation (Non-Medical): No  Physical Activity: Inactive (12/04/2023)   Exercise Vital Sign    Days of Exercise per Week: 0 days    Minutes of  Exercise per Session: 0 min  Stress: No Stress Concern Present (12/04/2023)   [employer] of Occupational Health - Occupational Stress Questionnaire    Feeling of Stress : Not at all  Social Connections: Moderately Isolated (12/04/2023)   Social Connection and Isolation Panel [NHANES]    Frequency of Communication with Friends and Family: Three times a week    Frequency of Social Gatherings with Friends and Family: Three times a week    Attends Religious Services: 1 to 4 times per year    Active Member of Clubs or Organizations: No    Attends Banker Meetings: Never    Marital Status: Never married  Intimate Partner Violence: Not At Risk (12/04/2023)   Humiliation, Afraid, Rape, and Kick questionnaire    Fear of Current or Ex-Partner: No    Emotionally Abused: No    Physically Abused: No  Sexually Abused: No   Outpatient Medications Prior to Visit  Medication Sig Dispense Refill   Cholecalciferol (PA VITAMIN D-3 GUMMY PO) Take 1 each by mouth at bedtime.     Multiple Vitamin (MULTIVITAMIN ADULT PO) Take by mouth.     Ascorbic Acid (VITAMIN C) 1000 MG tablet Take 1,000 mg by mouth daily.     B Complex-Folic Acid (B COMPLEX PLUS PO) Take 1 capsule by mouth daily.     cefUROXime (CEFTIN) 500 MG tablet Take 1 tablet (500 mg total) by mouth 2 (two) times daily with a meal. 14 tablet 0   levothyroxine (SYNTHROID) 50 MCG tablet Take 50 mcg by mouth daily before breakfast. (Patient not taking: Reported on 12/04/2023)     miconazole (MICOTIN) 2 % cream Apply 1 Application topically 2 (two) times daily. 28.35 g 0   omeprazole (PRILOSEC) 40 MG capsule Take 1 capsule (40 mg total) by mouth in the morning and at bedtime. 30 capsule 3   No facility-administered medications prior to visit.   No Known Allergies  ROS: A complete ROS was performed with pertinent positives/negatives noted in the HPI. The remainder of the ROS are negative.   Objective:   Today's Vitals   12/04/23  1058  BP: 109/72  Pulse: 84  SpO2: 98%  Weight: 160 lb (72.6 kg)  Height: 5\' 4"  (1.626 m)    GENERAL: Well-appearing, in NAD. Well nourished.  SKIN: Pink, warm and dry.  Head: Normocephalic. NECK: Trachea midline. Full ROM w/o pain or tenderness.  RESPIRATORY: Chest wall symmetrical. Respirations even and non-labored.  MSK: Muscle tone and strength appropriate for age.  EXTREMITIES: Without clubbing, cyanosis, or edema.  NEUROLOGIC: No motor or sensory deficits. Steady, even gait. C2-C12 intact.  PSYCH/MENTAL STATUS: Alert, oriented x 3. Cooperative, appropriate mood and affect.       Assessment & Plan:  1. Chronic fatigue (Primary) 2. Myalgia Discussed multifactorial approach to chronic fatigue and myalgias that is currently uncontrolled.  Will check CBC, CMP, A1c, vitamin D, TSH to rule out possible comorbidities contributing to chronic fatigue.  Patient requesting cortisol and coenzyme every 10 due to ongoing myalgias and fatigue.  We discussed that these may not be reliable testing sources to adequately examine cortisol and coenzyme q10 levels.  Patient verbalized understanding and would like to proceed.  He will return for fasting labs in the morning. - CBC with Differential/Platelet; Future - Comprehensive metabolic panel; Future - Hemoglobin A1c; Future - Cortisol; Future - Coenzyme Q10, Total; Future  3. Snoring Concern for possible OSA that is contributing to significant fatigue.  Will refer to pulmonology for sleep study. - Ambulatory referral to Pulmonology  4. Vitamin D deficiency Patient currently not taking any vitamin D replacement and likely uncontrolled.  Will obtain vitamin D with lab work and provide replacement as needed pending result. - VITAMIN D 25 Hydroxy (Vit-D Deficiency, Fractures); Future  5. History of hypothyroidism Will obtain TSH with lab work and evaluate for thyroid control.  Patient currently not taking levothyroxine as directed. - TSH Rfx on  Abnormal to Free T4; Future  6. Gastroesophageal reflux disease, unspecified whether esophagitis present Uncontrolled.  Omeprazole 40 mg not covered by insurance, will send in pantoprazole 40 mg daily.  Also recommend patient to use Pepcid 20 mg twice daily for the next 2 weeks for relief of symptoms.  Follow-up if no improvement in 4 weeks. - pantoprazole (PROTONIX) 40 MG tablet; Take 1 tablet (40 mg total) by mouth daily.  Dispense: 30 tablet; Refill: 3   Patient to reach out to office if new, worrisome, or unresolved symptoms arise or if no improvement in patient's condition. Patient verbalized understanding and is agreeable to treatment plan. All questions answered to patient's satisfaction.    Return in about 2 months (around 02/01/2024) for ANNUAL PHYSICAL.    Hilbert Bible, [place]

## 2023-12-05 ENCOUNTER — Other Ambulatory Visit (HOSPITAL_BASED_OUTPATIENT_CLINIC_OR_DEPARTMENT_OTHER): Payer: Self-pay | Admitting: *Deleted

## 2023-12-05 DIAGNOSIS — M791 Myalgia, unspecified site: Secondary | ICD-10-CM

## 2023-12-05 DIAGNOSIS — Z8639 Personal history of other endocrine, nutritional and metabolic disease: Secondary | ICD-10-CM

## 2023-12-05 DIAGNOSIS — E559 Vitamin D deficiency, unspecified: Secondary | ICD-10-CM

## 2023-12-05 DIAGNOSIS — R5382 Chronic fatigue, unspecified: Secondary | ICD-10-CM

## 2023-12-13 LAB — CBC WITH DIFFERENTIAL/PLATELET
Basophils Absolute: 0 10*3/uL (ref 0.0–0.2)
Basos: 1 %
EOS (ABSOLUTE): 0.1 10*3/uL (ref 0.0–0.4)
Eos: 1 %
Hematocrit: 48.1 % (ref 37.5–51.0)
Hemoglobin: 15.9 g/dL (ref 13.0–17.7)
Immature Grans (Abs): 0 10*3/uL (ref 0.0–0.1)
Immature Granulocytes: 1 %
Lymphocytes Absolute: 1.7 10*3/uL (ref 0.7–3.1)
Lymphs: 34 %
MCH: 30.8 pg (ref 26.6–33.0)
MCHC: 33.1 g/dL (ref 31.5–35.7)
MCV: 93 fL (ref 79–97)
Monocytes Absolute: 0.5 10*3/uL (ref 0.1–0.9)
Monocytes: 9 %
Neutrophils Absolute: 2.7 10*3/uL (ref 1.4–7.0)
Neutrophils: 54 %
Platelets: 271 10*3/uL (ref 150–450)
RBC: 5.17 x10E6/uL (ref 4.14–5.80)
RDW: 12.9 % (ref 11.6–15.4)
WBC: 5 10*3/uL (ref 3.4–10.8)

## 2023-12-13 LAB — COMPREHENSIVE METABOLIC PANEL
ALT: 33 IU/L (ref 0–44)
AST: 21 IU/L (ref 0–40)
Albumin: 4.8 g/dL (ref 4.3–5.2)
Alkaline Phosphatase: 61 IU/L (ref 44–121)
BUN/Creatinine Ratio: 12 (ref 9–20)
BUN: 12 mg/dL (ref 6–20)
Bilirubin Total: 0.4 mg/dL (ref 0.0–1.2)
CO2: 25 mmol/L (ref 20–29)
Calcium: 10.2 mg/dL (ref 8.7–10.2)
Chloride: 102 mmol/L (ref 96–106)
Creatinine, Ser: 0.97 mg/dL (ref 0.76–1.27)
Globulin, Total: 2.5 g/dL (ref 1.5–4.5)
Glucose: 95 mg/dL (ref 70–99)
Potassium: 4.4 mmol/L (ref 3.5–5.2)
Sodium: 142 mmol/L (ref 134–144)
Total Protein: 7.3 g/dL (ref 6.0–8.5)
eGFR: 109 mL/min/{1.73_m2} (ref >59)

## 2023-12-13 LAB — CORTISOL: Cortisol: 12.1 ug/dL (ref 6.2–19.4)

## 2023-12-13 LAB — HEMOGLOBIN A1C
Est. average glucose Bld gHb Est-mCnc: 108 mg/dL
Hgb A1c MFr Bld: 5.4 % (ref 4.8–5.6)

## 2023-12-13 LAB — VITAMIN D 25 HYDROXY (VIT D DEFICIENCY, FRACTURES): Vit D, 25-Hydroxy: 25.3 ng/mL — ABNORMAL LOW (ref 30.0–100.0)

## 2023-12-13 LAB — T4F: T4,Free (Direct): 0.93 ng/dL (ref 0.82–1.77)

## 2023-12-13 LAB — TSH RFX ON ABNORMAL TO FREE T4: TSH: 5.27 u[IU]/mL — ABNORMAL HIGH (ref 0.450–4.500)

## 2023-12-13 LAB — COENZYME Q10, TOTAL: Coenzyme Q10, Total: 1.05 ug/mL (ref 0.37–2.20)

## 2023-12-15 ENCOUNTER — Telehealth (HOSPITAL_BASED_OUTPATIENT_CLINIC_OR_DEPARTMENT_OTHER): Payer: Self-pay | Admitting: *Deleted

## 2023-12-15 NOTE — Telephone Encounter (Signed)
 Copied from CRM [id number]. Topic: General - Call Back - No Documentation >> Dec 15, 2023  1:26 PM Lars Mage H wrote: Reason for CRM: Patient is calling back regarding a call he received from one of the nurses regarding his lab results - I reached out to the CAL and the nurse was with a patient - I advised the patient that the nurse could give him a call back - He's currently at work and won't be able to take a call - he will try to call back during his next break when he has a moment.

## 2023-12-16 ENCOUNTER — Encounter (HOSPITAL_BASED_OUTPATIENT_CLINIC_OR_DEPARTMENT_OTHER): Payer: Self-pay | Admitting: *Deleted

## 2023-12-16 NOTE — Telephone Encounter (Signed)
 Sent pt mychart message of recent lab results.

## 2023-12-19 ENCOUNTER — Telehealth (HOSPITAL_BASED_OUTPATIENT_CLINIC_OR_DEPARTMENT_OTHER): Payer: Self-pay | Admitting: Family Medicine

## 2023-12-19 NOTE — Telephone Encounter (Signed)
 Patient called and he says his question is how can he have a low thyroid all his life from a child and now all of a sudden it's high? He says he would like a call back today regarding this answer and if not today, leave a VM and he will check it. I asked did he check his MyChart messages, he says only if he knows he will be receiving one, otherwise he doesn't. He says that he went to the health food store and bought a Vitamin D/K2 liquid that has 500 International Units of Vitamin D and 600 mg Calcium. He says Vitamin D makes him sleepy, so he takes it at night. He takes Vitamin D gummies at night that has either 250 or 500 per gummy. He says he will check it and take the correct dosage of both that will equal the 1000 needed. Advised I will send this to the provider.

## 2023-12-19 NOTE — Telephone Encounter (Signed)
 Copied from CRM [id number]. Topic: Clinical - Lab/Test Results >> Dec 19, 2023 11:19 AM Elmarie Shiley S wrote: Reason for CRM: Relayed doctor message about the lab results. Patient requesting call back about abnormal results

## 2023-12-22 NOTE — Telephone Encounter (Signed)
 Keith Lawrence, please advise on the message from pt about his thyroid.

## 2023-12-23 NOTE — Telephone Encounter (Signed)
 Called and spoke with pt letting him know the info per alexis and he verbalized understanding. Nothing further needed.

## 2024-01-01 ENCOUNTER — Ambulatory Visit (HOSPITAL_BASED_OUTPATIENT_CLINIC_OR_DEPARTMENT_OTHER): Payer: BLUE CROSS/BLUE SHIELD | Admitting: Adult Health

## 2024-01-01 ENCOUNTER — Institutional Professional Consult (permissible substitution) (HOSPITAL_BASED_OUTPATIENT_CLINIC_OR_DEPARTMENT_OTHER): Payer: BLUE CROSS/BLUE SHIELD | Admitting: Adult Health

## 2024-01-29 ENCOUNTER — Encounter: Payer: Self-pay | Admitting: Internal Medicine

## 2024-01-29 ENCOUNTER — Ambulatory Visit (INDEPENDENT_AMBULATORY_CARE_PROVIDER_SITE_OTHER): Payer: Self-pay | Admitting: Internal Medicine

## 2024-01-29 VITALS — BP 110/76 | HR 75 | Ht 64.0 in | Wt 162.5 lb

## 2024-01-29 DIAGNOSIS — R0609 Other forms of dyspnea: Secondary | ICD-10-CM

## 2024-01-29 DIAGNOSIS — G4719 Other hypersomnia: Secondary | ICD-10-CM

## 2024-01-29 DIAGNOSIS — Z87891 Personal history of nicotine dependence: Secondary | ICD-10-CM | POA: Diagnosis not present

## 2024-01-29 DIAGNOSIS — J45909 Unspecified asthma, uncomplicated: Secondary | ICD-10-CM | POA: Diagnosis not present

## 2024-01-29 LAB — NITRIC OXIDE: Nitric Oxide: 7

## 2024-01-29 MED ORDER — ALBUTEROL SULFATE HFA 108 (90 BASE) MCG/ACT IN AERS: 2.0000 | INHALATION_SPRAY | RESPIRATORY_TRACT | 2 refills | Status: AC | PRN

## 2024-01-29 MED ORDER — BUDESONIDE-FORMOTEROL FUMARATE 160-4.5 MCG/ACT IN AERO: 2.0000 | INHALATION_SPRAY | Freq: Two times a day (BID) | RESPIRATORY_TRACT | 12 refills | Status: DC

## 2024-01-29 NOTE — Progress Notes (Signed)
 [facility]monary Medicine Consultation      Date: 01/29/2024,   MRN# [id number] Keith Lawrence 1996/04/18       CHIEF COMPLAINT:   Assessment for sleep apnea Assessment of asthma   HISTORY OF PRESENT ILLNESS    Patient is seen today for problems and issues with sleep related to excessive daytime sleepiness Patient  has been having sleep problems for many years Patient has been having excessive daytime sleepiness for a long time Patient has been having extreme fatigue and tiredness, lack of energy +  very Loud snoring every night   Discussed sleep data and reviewed with patient.  Encouraged proper weight management.  Discussed driving precautions and its relationship with hypersomnolence.  Discussed operating dangerous equipment and its relationship with hypersomnolence.  Discussed sleep hygiene, and benefits of a fixed sleep waked time.  The importance of getting eight or more hours of sleep discussed with patient.  Discussed limiting the use of the computer and television before bedtime.  Decrease naps during the day, so night time sleep will become enhanced.  Limit caffeine, and sleep deprivation.  HTN, stroke, and heart failure are potential risk factors.   Discussed risk of untreated sleep apnea including cardiac arrhthymias, stroke, DM, pulm HTN.    EPWORTH SLEEP SCORE 12     01/29/2024   10:27 AM  Results of the Epworth flowsheet  Sitting and reading 3  Watching TV 2  Sitting, inactive in a public place (e.g. a theatre or a meeting) 1  As a passenger in a car for an hour without a break 3  Lying down to rest in the afternoon when circumstances permit 2  Sitting and talking to someone 1  Sitting quietly after a lunch without alcohol 0  In a car, while stopped for a few minutes in traffic 0  Total score 12     Assessment of ASTHMA FeNO  7    ppb-exhaled Nitric oxide  testing is not consistent with type II inflammation at this time History of  childhood asthma used Advair as a child Albuterol  as needed Taken off all inhalers at the age of 73 Patient has a hard time with activity Has intermittent wheezing and shortness of breath Patient is a former smoker quit 7 years ago History of COVID x 2 History of the flu 3 months ago  No exacerbation at this time No evidence of heart failure at this time No evidence or signs of infection at this time No respiratory distress No fevers, chills, nausea, vomiting, diarrhea No evidence of lower extremity edema No evidence hemoptysis  Patient with moderate persistent asthma No exacerbation at this time    PAST MEDICAL HISTORY   Past Medical History:  Diagnosis Date   Abdominal pain    all over pain   ADHD (attention deficit hyperactivity disorder)    Asthma    Fatigue    Goiter    Hypothyroidism, acquired, autoimmune    Iron excess    Isosexual precocity    Nausea and vomiting    Physical growth delay    SOB (shortness of breath) on exertion    Thyroiditis, autoimmune      SURGICAL HISTORY   Past Surgical History:  Procedure Laterality Date   CHALAZION EXCISION     ESOPHAGOGASTRODUODENOSCOPY  03/19/2016   per Dr. Dominic Friendly, normal    FRENULECTOMY, LINGUAL     TONSILLECTOMY AND ADENOIDECTOMY       FAMILY HISTORY   Family History  Problem Relation  Age of Onset   Thyroid  disease Maternal Grandmother    Heart disease Maternal Grandmother    Thyroid  disease Paternal Aunt    Heart disease Paternal Grandfather    Stroke Paternal Uncle    Diabetes Neg Hx    Colon cancer Neg Hx      SOCIAL HISTORY   Social History   Tobacco Use   Smoking status: Former    Current packs/day: 0.00    Average packs/day: 1 pack/day for 7.0 years (7.0 ttl pk-yrs)    Types: Cigarettes    Start date: 01/11/2012    Quit date: 03/09/2014    Years since quitting: 9.8   Smokeless tobacco: Never  Substance Use Topics   Alcohol use: Yes    Alcohol/week: 0.0 standard drinks of alcohol    Drug use: Yes    Types: Other-see comments, Marijuana    Comment: Stopped 5 months ago     MEDICATIONS    Home Medication:  Current Outpatient Rx   Order #: [id number] Class: Historical Med   Order #: [id number] Class: Historical Med   Order #: [id number] Class: Normal    Current Medication:  Current Outpatient Medications:    Cholecalciferol (PA VITAMIN D -3 GUMMY PO), Take 1 each by mouth at bedtime., Disp: , Rfl:    Multiple Vitamin (MULTIVITAMIN ADULT PO), Take by mouth., Disp: , Rfl:    pantoprazole  (PROTONIX ) 40 MG tablet, Take 1 tablet (40 mg total) by mouth daily., Disp: 30 tablet, Rfl: 3    ALLERGIES   Patient has no known allergies.      Review of Systems: Gen:  Denies  fever, sweats, chills weight loss  HEENT: Denies blurred vision, double vision, ear pain, eye pain, hearing loss, nose bleeds, sore throat Cardiac:  No dizziness, chest pain or heaviness, chest tightness,edema, No JVD Resp:   No cough, -sputum production, -shortness of breath,-wheezing, -hemoptysis,  Other:  All other systems negative   Physical Examination:   General Appearance: No distress  EYES PERRLA, EOM intact.   NECK Supple, No JVD Pulmonary: normal breath sounds, No wheezing.  CardiovascularNormal S1,S2.  No m/r/g.   Abdomen: Benign, Soft, non-tender. Neurology UE/LE 5/5 strength, no focal deficits Ext pulses intact, cap refill intact ALL OTHER ROS ARE NEGATIVE     IMAGING    CT scan chest lungs 2021 No evidence of interstitial lung disease No pneumonia, no effusions   ASSESSMENT/PLAN   28 year old pleasant white male seen today for assessment for reactive airways disease secondary to asthma as well as signs symptoms of obstructive sleep apnea in the setting of excessive daytime sleepiness  Assessment of asthma Start Symbicort  2 puffs in the morning 2 puffs at night Rinse mouth after use  albuterol  as needed and prior to exertion Avoid Allergens and Irritants Avoid  secondhand smoke Avoid SICK contacts Recommend  Masking  when appropriate Recommend Keep up-to-date with vaccinations No exacerbation at this time No evidence of heart failure at this time No evidence or signs of infection at this time No respiratory distress No fevers, chills, nausea, vomiting, diarrhea No evidence of lower extremity edema No evidence hemoptysis   Assessment for sleep apnea Recommend home sleep study to assess for sleep apnea Home sleep test ordered  Be aware of reduced alertness and do not drive or operate heavy machinery if experiencing this or drowsiness.  Exercise encouraged, as tolerated. Encouraged proper weight management.  Important to get eight or more hours of sleep  Limiting the use of the computer and television  before bedtime.  Decrease naps during the day, so night time sleep will become enhanced.  Limit caffeine, and sleep deprivation.  HTN, stroke, uncontrolled diabetes and heart failure are potential risk factors.  Risk of untreated sleep apnea including cardiac arrhthymias, stroke, DM, pulm HTN.      MEDICATION ADJUSTMENTS/LABS AND TESTS ORDERED: Start Symbicort  inhaler 2 puffs in the morning 2 puffs at night Rinse mouth after every use Use albuterol  2 puffs every 4 hours as needed Obtain pulmonary function test to assess lung function Obtain home sleep study to assess for sleep apnea Avoid Allergens and Irritants Avoid secondhand smoke Avoid SICK contacts Recommend  Masking  when appropriate Recommend Keep up-to-date with vaccinations   CURRENT MEDICATIONS REVIEWED AT LENGTH WITH PATIENT TODAY   Patient  satisfied with Plan of action and management. All questions answered  Follow up 6 weeks  I spent a total of 60 minutes reviewing chart data, face-to-face evaluation with the patient, counseling and coordination of care as detailed above.     Lady Pier, M.D.  [facility]  Medical  Director [facility] Medical Director [facility] Cardio-Pulmonary Department

## 2024-01-29 NOTE — Patient Instructions (Signed)
 Start Symbicort  inhaler 2 puffs in the morning 2 puffs at night Rinse mouth after every use  Use albuterol  2 puffs every 4 hours as needed  Obtain pulmonary function test to assess lung function  Obtain home sleep study to assess for sleep apnea  Avoid Allergens and Irritants Avoid secondhand smoke Avoid SICK contacts Recommend  Masking  when appropriate Recommend Keep up-to-date with vaccinations

## 2024-01-30 ENCOUNTER — Telehealth: Payer: Self-pay

## 2024-01-30 NOTE — Telephone Encounter (Signed)
 Copied from CRM [id number]. Topic: Clinical - Prescription Issue >> Jan 29, 2024 11:33 AM Roseanne Cones wrote: Reason for CRM: [facility] is [facility] and states that he can not pay for the Symbicort  - it's way to expensive and they would like to know if there's an alternative that can be prescribed. The pharmacy was not able to provide any approved alternatives as his insurance is out of date in their system - he's about to call his insurance company to get updated information for the pharmacy - he will call us  back with the suggested inhaler to replace the Symbicort  inhaler. Sending this CRM to clinical pool for awareness - Patient will call back with more information.

## 2024-01-30 NOTE — Telephone Encounter (Signed)
 LMTCB to see if he contacted his insurance company  . E2C2 please advise when patient calls back.

## 2024-02-03 MED ORDER — BREZTRI AEROSPHERE 160-9-4.8 MCG/ACT IN AERO: 2.0000 | INHALATION_SPRAY | Freq: Two times a day (BID) | RESPIRATORY_TRACT | 0 refills | Status: AC

## 2024-02-03 MED ORDER — BREZTRI AEROSPHERE 160-9-4.8 MCG/ACT IN AERO: 2.0000 | INHALATION_SPRAY | Freq: Two times a day (BID) | RESPIRATORY_TRACT | 0 refills | Status: DC

## 2024-02-03 NOTE — Telephone Encounter (Addendum)
 Per Dr. Auston Left, ok to try Breztri and apply for the patient assistance program.  I have notified the patient. I have left the application form, a sample of Breztri and a 30 free trial coupon card at the front desk for him to pick up. I have also, sent in a script to his pharmacy.  Leaving this encounter open to ensure from completion.

## 2024-02-03 NOTE — Telephone Encounter (Signed)
 [facility] reports insurance is not active. Pharmacist reports cash price is $151.83. Patient reports he has not been able to get in contact with insurance company.  Patient requesting new affordable prescription sent to [facility].

## 2024-02-05 ENCOUNTER — Encounter (HOSPITAL_BASED_OUTPATIENT_CLINIC_OR_DEPARTMENT_OTHER): Payer: BLUE CROSS/BLUE SHIELD | Admitting: Family Medicine

## 2024-02-06 NOTE — Telephone Encounter (Signed)
 Patient reports he will come pick up samples next week Thursday or Friday. NFN.

## 2024-03-05 ENCOUNTER — Ambulatory Visit: Payer: Self-pay | Admitting: Internal Medicine

## 2024-03-05 ENCOUNTER — Ambulatory Visit: Admitting: Internal Medicine

## 2024-03-05 ENCOUNTER — Encounter

## 2024-03-05 NOTE — Progress Notes (Deleted)
 [facility] Pulmonary Medicine Consultation      Date: 03/05/2024,   MRN# [id number] Keith Lawrence 1996-02-08   ASTHMA HISTORY History of childhood asthma used Advair as a child Albuterol  as needed Taken off all inhalers at the age of 76 Patient has a hard time with activity Has intermittent wheezing and shortness of breath Patient is a former smoker quit 7 years ago History of COVID x 2 History of the flu     CHIEF COMPLAINT:   Assessment for sleep apnea Assessment of asthma   HISTORY OF PRESENT ILLNESS    Patient is seen today for problems and issues with sleep related to excessive daytime sleepiness Patient  has been having sleep problems for many years Patient has been having excessive daytime sleepiness for a long time Patient has been having extreme fatigue and tiredness, lack of energy +  very Loud snoring every night   Discussed sleep data and reviewed with patient.  Encouraged proper weight management.  Discussed driving precautions and its relationship with hypersomnolence.  Discussed operating dangerous equipment and its relationship with hypersomnolence.  Discussed sleep hygiene, and benefits of a fixed sleep waked time.  The importance of getting eight or more hours of sleep discussed with patient.  Discussed limiting the use of the computer and television before bedtime.  Decrease naps during the day, so night time sleep will become enhanced.  Limit caffeine, and sleep deprivation.  HTN, stroke, and heart failure are potential risk factors.   Discussed risk of untreated sleep apnea including cardiac arrhthymias, stroke, DM, pulm HTN.    EPWORTH SLEEP SCORE 12     01/29/2024   10:27 AM  Results of the Epworth flowsheet  Sitting and reading 3  Watching TV 2  Sitting, inactive in a public place (e.g. a theatre or a meeting) 1  As a passenger in a car for an hour without a break 3  Lying down to rest in the afternoon when circumstances  permit 2  Sitting and talking to someone 1  Sitting quietly after a lunch without alcohol 0  In a car, while stopped for a few minutes in traffic 0  Total score 12       No exacerbation at this time No evidence of heart failure at this time No evidence or signs of infection at this time No respiratory distress No fevers, chills, nausea, vomiting, diarrhea No evidence of lower extremity edema No evidence hemoptysis  Patient with moderate persistent asthma No exacerbation at this time    PAST MEDICAL HISTORY   Past Medical History:  Diagnosis Date   Abdominal pain    all over pain   ADHD (attention deficit hyperactivity disorder)    Asthma    Fatigue    Goiter    Hypothyroidism, acquired, autoimmune    Iron excess    Isosexual precocity    Nausea and vomiting    Physical growth delay    SOB (shortness of breath) on exertion    Thyroiditis, autoimmune      SURGICAL HISTORY   Past Surgical History:  Procedure Laterality Date   CHALAZION EXCISION     ESOPHAGOGASTRODUODENOSCOPY  03/19/2016   per Dr. Dominic Friendly, normal    FRENULECTOMY, LINGUAL     TONSILLECTOMY AND ADENOIDECTOMY       FAMILY HISTORY   Family History  Problem Relation Age of Onset   Thyroid  disease Maternal Grandmother    Heart disease Maternal Grandmother    Thyroid  disease Paternal Aunt  Heart disease Paternal Grandfather    Stroke Paternal Uncle    Diabetes Neg Hx    Colon cancer Neg Hx      SOCIAL HISTORY   Social History   Tobacco Use   Smoking status: Former    Current packs/day: 0.00    Average packs/day: 1 pack/day for 7.0 years (7.0 ttl pk-yrs)    Types: Cigarettes    Start date: 01/11/2012    Quit date: 03/09/2014    Years since quitting: 9.9   Smokeless tobacco: Never  Vaping Use   Vaping status: Never Used  Substance Use Topics   Alcohol use: Not Currently   Drug use: Yes    Types: Other-see comments, Marijuana    Comment: Stopped 5 months ago     MEDICATIONS     Home Medication:  Current Outpatient Rx   Order #: [id number] Class: Normal   Order #: [id number] Class: Normal   Order #: [id number] Class: Sample   Order #: [id number] Class: Historical Med   Order #: [id number] Class: Historical Med   Order #: [id number] Class: Normal    Current Medication:  Current Outpatient Medications:    albuterol  (VENTOLIN  HFA) 108 (90 Base) MCG/ACT inhaler, Inhale 2 puffs into the lungs every 4 (four) hours as needed for wheezing or shortness of breath., Disp: 8 g, Rfl: 2   budeson-glycopyrrolate-formoterol  (BREZTRI  AEROSPHERE) 160-9-4.8 MCG/ACT AERO inhaler, Inhale 2 puffs into the lungs in the morning and at bedtime., Disp: 10.7 g, Rfl: 0   budeson-glycopyrrolate-formoterol  (BREZTRI  AEROSPHERE) 160-9-4.8 MCG/ACT AERO inhaler, Inhale 2 puffs into the lungs in the morning and at bedtime., Disp: 5.9 g, Rfl: 0   Cholecalciferol (PA VITAMIN D -3 GUMMY PO), Take 1 each by mouth at bedtime., Disp: , Rfl:    Multiple Vitamin (MULTIVITAMIN ADULT PO), Take by mouth., Disp: , Rfl:    pantoprazole  (PROTONIX ) 40 MG tablet, Take 1 tablet (40 mg total) by mouth daily., Disp: 30 tablet, Rfl: 3    ALLERGIES   Patient has no known allergies.      Review of Systems: Gen:  Denies  fever, sweats, chills weight loss  HEENT: Denies blurred vision, double vision, ear pain, eye pain, hearing loss, nose bleeds, sore throat Cardiac:  No dizziness, chest pain or heaviness, chest tightness,edema, No JVD Resp:   No cough, -sputum production, -shortness of breath,-wheezing, -hemoptysis,  Other:  All other systems negative   Physical Examination:   General Appearance: No distress  EYES PERRLA, EOM intact.   NECK Supple, No JVD Pulmonary: normal breath sounds, No wheezing.  CardiovascularNormal S1,S2.  No m/r/g.   Abdomen: Benign, Soft, non-tender. Neurology UE/LE 5/5 strength, no focal deficits Ext pulses intact, cap refill intact ALL OTHER ROS ARE NEGATIVE     IMAGING     CT scan chest lungs 2021 No evidence of interstitial lung disease No pneumonia, no effusions   ASSESSMENT/PLAN   28 year old pleasant white male seen today for assessment for reactive airways disease secondary to asthma as well as signs symptoms of obstructive sleep apnea in the setting of excessive daytime sleepiness  Assessment of asthma Start Symbicort  2 puffs in the morning 2 puffs at night Rinse mouth after use  albuterol  as needed and prior to exertion Avoid Allergens and Irritants Avoid secondhand smoke Avoid SICK contacts Recommend  Masking  when appropriate Recommend Keep up-to-date with vaccinations No exacerbation at this time No evidence of heart failure at this time No evidence or signs of infection at this time No respiratory distress No fevers, chills,  nausea, vomiting, diarrhea No evidence of lower extremity edema No evidence hemoptysis   Assessment for sleep apnea Recommend home sleep study to assess for sleep apnea Home sleep test ordered  Be aware of reduced alertness and do not drive or operate heavy machinery if experiencing this or drowsiness.  Exercise encouraged, as tolerated. Encouraged proper weight management.  Important to get eight or more hours of sleep  Limiting the use of the computer and television before bedtime.  Decrease naps during the day, so night time sleep will become enhanced.  Limit caffeine, and sleep deprivation.  HTN, stroke, uncontrolled diabetes and heart failure are potential risk factors.  Risk of untreated sleep apnea including cardiac arrhthymias, stroke, DM, pulm HTN.      MEDICATION ADJUSTMENTS/LABS AND TESTS ORDERED: Start Symbicort  inhaler 2 puffs in the morning 2 puffs at night Rinse mouth after every use Use albuterol  2 puffs every 4 hours as needed Obtain pulmonary function test to assess lung function Obtain home sleep study to assess for sleep apnea Avoid Allergens and Irritants Avoid secondhand  smoke Avoid SICK contacts Recommend  Masking  when appropriate Recommend Keep up-to-date with vaccinations   CURRENT MEDICATIONS REVIEWED AT LENGTH WITH PATIENT TODAY   Patient  satisfied with Plan of action and management. All questions answered  Follow up 6 weeks  I spent a total of 60 minutes reviewing chart data, face-to-face evaluation with the patient, counseling and coordination of care as detailed above.     Lady Pier, M.D.  [facility]  Medical Director [facility] Medical Director [facility] Cardio-Pulmonary Department

## 2024-04-14 ENCOUNTER — Telehealth (HOSPITAL_BASED_OUTPATIENT_CLINIC_OR_DEPARTMENT_OTHER): Payer: Self-pay | Admitting: *Deleted

## 2024-04-14 ENCOUNTER — Encounter (HOSPITAL_BASED_OUTPATIENT_CLINIC_OR_DEPARTMENT_OTHER): Payer: Self-pay

## 2024-04-14 NOTE — Telephone Encounter (Signed)
 Copied from CRM [id number]. Topic: Appointments - Appointment Info/Confirmation >> Apr 14, 2024  1:51 PM Donna BRAVO wrote: Patient/patient representative is calling for information regarding an appointment.  Patient calling asking about cost of appt patient has no insurance. Provided billing and insuance phone number  and called  Patient would like to know how much the appt will cost.  Patient phone [phone] ok to leave detailed message

## 2024-04-15 ENCOUNTER — Encounter (HOSPITAL_BASED_OUTPATIENT_CLINIC_OR_DEPARTMENT_OTHER): Payer: Self-pay | Admitting: Family Medicine

## 2024-04-15 ENCOUNTER — Other Ambulatory Visit (HOSPITAL_COMMUNITY)
Admission: RE | Admit: 2024-04-15 | Discharge: 2024-04-15 | Disposition: A | Discharge status: Home / Self Care | Payer: Self-pay | Source: Ambulatory Visit | Attending: Family Medicine | Admitting: Family Medicine

## 2024-04-15 ENCOUNTER — Ambulatory Visit (INDEPENDENT_AMBULATORY_CARE_PROVIDER_SITE_OTHER): Payer: Self-pay | Admitting: Family Medicine

## 2024-04-15 VITALS — BP 119/85 | HR 68 | Ht 64.0 in | Wt 165.0 lb

## 2024-04-15 DIAGNOSIS — Z1159 Encounter for screening for other viral diseases: Secondary | ICD-10-CM

## 2024-04-15 DIAGNOSIS — F649 Gender identity disorder, unspecified: Secondary | ICD-10-CM

## 2024-04-15 DIAGNOSIS — Z114 Encounter for screening for human immunodeficiency virus [HIV]: Secondary | ICD-10-CM

## 2024-04-15 DIAGNOSIS — Z202 Contact with and (suspected) exposure to infections with a predominantly sexual mode of transmission: Secondary | ICD-10-CM

## 2024-04-15 DIAGNOSIS — Z0000 Encounter for general adult medical examination without abnormal findings: Secondary | ICD-10-CM

## 2024-04-15 DIAGNOSIS — E063 Autoimmune thyroiditis: Secondary | ICD-10-CM

## 2024-04-15 DIAGNOSIS — R300 Dysuria: Secondary | ICD-10-CM

## 2024-04-15 LAB — POCT URINALYSIS DIP (CLINITEK)
Bilirubin, UA: NEGATIVE
Blood, UA: NEGATIVE
Glucose, UA: NEGATIVE mg/dL
Ketones, POC UA: NEGATIVE mg/dL
Nitrite, UA: NEGATIVE
POC PROTEIN,UA: NEGATIVE
Spec Grav, UA: >=1.03 — AB (ref 1.010–1.025)
Urobilinogen, UA: 0.2 U/dL
pH, UA: 7 (ref 5.0–8.0)

## 2024-04-15 NOTE — Patient Instructions (Addendum)
 Recommend:  3rd HPV Vaccine TDAP  [facility]     Things to do to keep yourself healthy: - Exercise at least 30-45 minutes a day, 3-4 days a week.  - Eat a low-fat diet with lots of fruits and vegetables, up to 7-9 servings per day.  - Seatbelts can save your life. Wear them always.  - Smoke detectors on every level of your home, check batteries every year.  - Eye Doctor - have an eye exam every 1-2 years  - Safe sex - if you may be exposed to STDs, use a condom.  - No smoking, vaping, or use of any tobacco products.  - Alcohol -  If you drink, do it moderately, less than 2 drinks per day.  - No illegal drug use.  - Depression is common in our stressful world.If you're feeling down or losing interest in things you normally enjoy, please come in for a visit.  - Violence - If anyone is threatening or hurting you, please call immediately.   Counseling and Mental Health Resources   [facility]  - For Women and Girls only - Cost based upon sliding scale of income - Financial Aid available  [phone] [address], [address] [place], [place] [address] [facility]  - Mental Health, Substance Abuse Treatment - IV Ketamine, Hydration and Weight Loss Programs - Center for Treatment for Resistant Depression and Suicidal Ideation  [address] [place], [place] [address]  [phone] [email or web address]   [facility]  - Individual and Family Counseling - Assessments and Therapy for Learning Disabilities, ADHD, Autism Spectrum Disorder, Processing Deficits  [phone] [address] [place], [place] [address]  [facility]  [address] [place] [address] [place], [place] [address]  [phone]  [facility]  - Individual, Family, Play and Group Therapy - In person and telehealth sessions available  Phone: [phone] Email: [email or web address]  [place] Location:   [address] [place], [place] [address]   [place] Location:   [address] [place], [place] [address] Denman Rose MA Clinical Psychology  [address] [place] [address]  [phone]  [facility], [facility]  Adult, Adolescent and [facility]  [address] [place], [place] [address]  Text:  [phone]   Call:  [phone] Email: [email or web address] [facility] - [facility]    [facility]  - Individual, Group Therapy - Day Programs, Wellness Coaching - Staff Programming, Workshops  [address], [place], [place] [address]  [phone]  [facility], [facility]  - Children, Adolescent, Adult and Family Therapy  [place] Location [phone]  [address] [place], [place]  [address]   [place] Location [phone]  [address] [place], [place]  [address]   [facility]  - Indivudual Counseling, [facility] [address] [place], [place] [address]  [phone] [facility]  -Services at less than a basic fee sponsored by [facility]  [address] [place], [place] [address]  [phone]   [facility]  Counseling offered by Psychology Doctoral Students  - Majority of Patients qualify for financial assistance   [address] [place], [place] [address]  [phone]
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
## 2024-04-16 ENCOUNTER — Other Ambulatory Visit (HOSPITAL_BASED_OUTPATIENT_CLINIC_OR_DEPARTMENT_OTHER): Payer: Self-pay | Admitting: *Deleted

## 2024-04-16 DIAGNOSIS — Z13220 Encounter for screening for lipoid disorders: Secondary | ICD-10-CM

## 2024-04-16 LAB — URINE CYTOLOGY ANCILLARY ONLY
Chlamydia: NEGATIVE
Comment: NEGATIVE
Comment: NEGATIVE
Comment: NORMAL
Neisseria Gonorrhea: NEGATIVE
Trichomonas: NEGATIVE

## 2024-04-16 LAB — HEPATITIS C ANTIBODY: Hep C Virus Ab: NONREACTIVE

## 2024-04-16 LAB — TSH RFX ON ABNORMAL TO FREE T4: TSH: 3.21 u[IU]/mL (ref 0.450–4.500)

## 2024-04-16 LAB — RPR: RPR Ser Ql: NONREACTIVE

## 2024-04-16 LAB — HIV ANTIBODY (ROUTINE TESTING W REFLEX): HIV Screen 4th Generation wRfx: NONREACTIVE

## 2024-04-17 LAB — URINE CULTURE: Organism ID, Bacteria: NO GROWTH

## 2024-04-18 ENCOUNTER — Ambulatory Visit (HOSPITAL_BASED_OUTPATIENT_CLINIC_OR_DEPARTMENT_OTHER): Payer: Self-pay | Admitting: Family Medicine

## 2024-04-18 NOTE — Progress Notes (Signed)
 Hi Keith Lawrence,  Your urine is negative for infection. Additionally, your STD testing does not show any signs of infection either. Your thyroid  function is stable. Please increase clear fluids. If you continue to have symptoms, please let me know.

## 2024-04-19 LAB — LIPID PANEL
Chol/HDL Ratio: 6.5 ratio — ABNORMAL HIGH (ref 0.0–5.0)
Cholesterol, Total: 200 mg/dL — ABNORMAL HIGH (ref 100–199)
HDL: 31 mg/dL — ABNORMAL LOW (ref >39)
LDL Chol Calc (NIH): 134 mg/dL — ABNORMAL HIGH (ref 0–99)
Triglycerides: 195 mg/dL — ABNORMAL HIGH (ref 0–149)
VLDL Cholesterol Cal: 35 mg/dL (ref 5–40)

## 2024-04-19 LAB — SPECIMEN STATUS REPORT

## 2024-04-19 NOTE — Telephone Encounter (Signed)
 Copied from CRM [id number]. Topic: Clinical - Medical Advice >> Apr 19, 2024  9:50 AM DeAngela L wrote: Reason for CRM: patient called and got results read to him and understood, but he woke up this morning says the symptoms return  feeling weak and has a sore throat, his voice is raspy and he has a cough that is uncontrollable, and back to having pain in the man area (patient believe he had the flu about to weeks ago he was sweating and he would like some type of meds to help him feel better) Pt num [phone] (M)     **Called and spoke with pt who states 1 day ago he started having complaints of a cough and also sore throat. States today his throat is not as sore as it was one day ago but states his main concern is the cough. Pt also has chest congestion and also feels weak. Pt said these symptoms are very similar to what he had about two weeks ago.  Pt did say that he is still going into work but due to this he wanted to know what we might recommend for him. Alexis, please advise.

## 2024-04-20 ENCOUNTER — Telehealth (HOSPITAL_BASED_OUTPATIENT_CLINIC_OR_DEPARTMENT_OTHER): Payer: Self-pay | Admitting: *Deleted

## 2024-04-20 NOTE — Telephone Encounter (Signed)
 Copied from CRM [id number]. Topic: Clinical - Lab/Test Results >> Apr 20, 2024  3:46 PM Elle L wrote: Reason for CRM: The patient called back regarding his lab results. I read the note verbatim and he expressed understanding. He advised that he did not eat prior to his labs being drawn but that all he eats is greasy meals. I reached out to the office to see if Damien needed to speak to him further but she was assisting other patients at this time. The patient's call back number is [phone].

## 2024-04-22 NOTE — Telephone Encounter (Signed)
Attempted to call pt but unable to reach. Left message to return call.  

## 2024-05-12 ENCOUNTER — Telehealth: Payer: Self-pay | Admitting: Internal Medicine

## 2024-05-12 NOTE — Telephone Encounter (Signed)
 LVMTCB to schedule PFT and rov.

## 2024-06-03 ENCOUNTER — Encounter (HOSPITAL_BASED_OUTPATIENT_CLINIC_OR_DEPARTMENT_OTHER): Payer: Self-pay | Admitting: Family Medicine

## 2024-06-03 ENCOUNTER — Ambulatory Visit (HOSPITAL_BASED_OUTPATIENT_CLINIC_OR_DEPARTMENT_OTHER): Payer: Self-pay | Admitting: Family Medicine

## 2024-06-03 VITALS — BP 115/73 | HR 96 | Ht 62.5 in | Wt 166.4 lb

## 2024-06-03 DIAGNOSIS — E559 Vitamin D deficiency, unspecified: Secondary | ICD-10-CM

## 2024-06-03 DIAGNOSIS — R6889 Other general symptoms and signs: Secondary | ICD-10-CM | POA: Insufficient documentation

## 2024-06-03 DIAGNOSIS — Z8249 Family history of ischemic heart disease and other diseases of the circulatory system: Secondary | ICD-10-CM | POA: Insufficient documentation

## 2024-06-03 DIAGNOSIS — R5382 Chronic fatigue, unspecified: Secondary | ICD-10-CM

## 2024-06-03 NOTE — Patient Instructions (Signed)
  Medication Instructions:  Your physician recommends that you continue on your current medications as directed. Please refer to the Current Medication list given to you today. --If you need a refill on any your medications before your next appointment, please call your pharmacy first. If no refills are authorized on file call the office.--   Referrals/Procedures/Imaging: [facility] [phone]  Cardiology   Follow-Up: Your next appointment:   Your physician recommends that you schedule a follow-up appointment in: as needed with Thersia Lanius will receive a text message or e-mail with a link to a survey about your care and experience with us  today! We would greatly appreciate your feedback!   Thanks for letting us  be apart of your health journey!!  Primary Care and Sports Medicine   Dr. Quintin sheerer [place]   We encourage you to activate your patient portal called MyChart.  Sign up information is provided on this After Visit Summary.  MyChart is used to connect with patients for Virtual Visits (Telemedicine).  Patients are able to view lab/test results, encounter notes, upcoming appointments, etc.  Non-urgent messages can be sent to your provider as well. To learn more about what you can do with MyChart, please visit --  [email or web address].

## 2024-06-03 NOTE — Progress Notes (Signed)
    Procedures performed today:    None.  Independent interpretation of notes and tests performed by another provider:   None.  Brief History, Exam, Impression, and Recommendations:    BP 115/73 (BP Location: Right Arm, Patient Position: Sitting, Cuff Size: Normal)   Pulse 96   Ht 5' 2.5 (1.588 m)   Wt 166 lb 6.4 oz (75.5 kg)   SpO2 98%   BMI 29.95 kg/m   Discussed the use of AI scribe software for clinical note transcription with the patient, who gave verbal consent to proceed.  History of Present Illness Keith Lawrence is a 28 year old male who presents with persistent weakness and shortness of breath.  He has experienced persistent weakness for several years, which has not improved despite taking vitamin D  supplements, believed to be 5000 IU. He experiences weakness and shortness of breath during physical activities such as running or walking, which has been ongoing since 2017. He becomes out of breath quickly and experiences chest pain during these activities. He also reports fluid retention in his legs.  There is a significant family history of cardiovascular issues, including heart attacks and heart failure on both sides of his family. His grandfather had five heart attacks and required stents. Strokes are also prevalent in his family history.  He has a history of asthma from childhood and uses an inhaler, which provides short-term relief when exposed to smoke but does not alleviate his exercise-induced symptoms. 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  He quit smoking in 2017 when his symptoms began, although he had smoked heavily prior to that. He has not been able to complete pulmonary function tests due to lack of insurance but plans to obtain insurance by November or December. His last appointment with a lung doctor was two months ago, but he could not afford the recommended tests.  Chronic fatigue Assessment & Plan: As above.  Recommend continued follow-up with PCP regarding management. Will continue with pulmonary evaluation and with referral to cardiology at this time.   Family history of heart disease -     Ambulatory referral to Cardiology  Exercise intolerance Assessment & Plan: Chronic fatigue with exertional dyspnea and peripheral edema since 2017. Symptoms suggest cardiac or pulmonary etiology. Family history of cardiovascular disease noted. Current inhaler ineffective during exertion. - Refer to cardiology for evaluation and potential stress test once insurance is in place. - Is established with pulmonology, recommend scheduling pulmonary function testing once insurance is in place.  Orders: -     Ambulatory referral to Cardiology  Vitamin D  deficiency Assessment & Plan: Vitamin D  levels borderline low despite 5000 IU supplementation. - Increase vitamin D  intake by taking an extra capsule on weekends to increase weekly dose. - Re-evaluate vitamin D  levels at next lab check.   Return if symptoms worsen or fail to improve.   ___________________________________________ Keith Buckner de [place], MD, ABFM, [facility] Primary Care and Sports Medicine [facility]

## 2024-06-03 NOTE — Assessment & Plan Note (Signed)
 Vitamin D  levels borderline low despite 5000 IU supplementation. - Increase vitamin D  intake by taking an extra capsule on weekends to increase weekly dose. - Re-evaluate vitamin D  levels at next lab check.

## 2024-06-03 NOTE — Assessment & Plan Note (Signed)
 As above. Recommend continued follow-up with PCP regarding management. Will continue with pulmonary evaluation and with referral to cardiology at this time.

## 2024-06-03 NOTE — Assessment & Plan Note (Signed)
 Chronic fatigue with exertional dyspnea and peripheral edema since 2017. Symptoms suggest cardiac or pulmonary etiology. Family history of cardiovascular disease noted. Current inhaler ineffective during exertion. - Refer to cardiology for evaluation and potential stress test once insurance is in place. - Is established with pulmonology, recommend scheduling pulmonary function testing once insurance is in place.

## 2024-06-09 ENCOUNTER — Telehealth (HOSPITAL_BASED_OUTPATIENT_CLINIC_OR_DEPARTMENT_OTHER): Payer: Self-pay | Admitting: *Deleted

## 2024-06-09 NOTE — Telephone Encounter (Signed)
 Copied from CRM #[id number]. Topic: Referral - Question >> Jun 09, 2024 10:16 AM Larissa RAMAN wrote: Reason for CRM: Patient requesting an update on cardiology referral. States no one has contacted him to make an appointment. Requesting a callback

## 2024-06-09 NOTE — Telephone Encounter (Signed)
 Please let patient know this was sent to [facility] and provide patient with number to schedule an appointment

## 2024-06-17 ENCOUNTER — Ambulatory Visit: Payer: Self-pay | Attending: Cardiovascular Disease | Admitting: Cardiovascular Disease

## 2024-06-17 ENCOUNTER — Encounter: Payer: Self-pay | Admitting: Cardiovascular Disease

## 2024-06-17 VITALS — BP 106/78 | HR 70 | Ht 64.0 in | Wt 167.4 lb

## 2024-06-17 DIAGNOSIS — E785 Hyperlipidemia, unspecified: Secondary | ICD-10-CM

## 2024-06-17 DIAGNOSIS — Z8249 Family history of ischemic heart disease and other diseases of the circulatory system: Secondary | ICD-10-CM

## 2024-06-17 DIAGNOSIS — R079 Chest pain, unspecified: Secondary | ICD-10-CM

## 2024-06-17 DIAGNOSIS — R0602 Shortness of breath: Secondary | ICD-10-CM

## 2024-06-17 NOTE — Progress Notes (Signed)
 Cardiology Office Note   Date:  06/17/2024   ID:  Keith Lawrence, DOB [birth year], MRN [id number]  PCP:  Knute Thersia Bitters, FNP  Cardiologist:   Deatrice Cage, MD   Chief Complaint  Patient presents with   New Patient (Initial Visit)    Fm Hx CAD c/o sob, cough since 2017 that he can't get rid of and unable to exert himself for long period of time. Meds reviewed verbally with pt.      History of Present Illness: Keith Lawrence is a 28 y.o. male who was referred by Dr. De Peru for evaluation of chest pain and shortness of breath.  He has history of prior tobacco use but quit in 2017.  He has history of asthma and GERD.  He has been dealing with exertional shortness of breath and chest pain since 2017.  This has been associated with cough.  He is unable to exert himself for a long period of time due to the symptoms.  He is very frustrated and concerned about his family history of premature coronary artery disease.  He drinks occasional alcohol but denies any drug use.  He works at the [employer].  He is exposed to secondhand smoking.  He was seen by Dr. Barbaraann in 2021 for similar symptoms.  His cardiac exam and EKG were unremarkable.  No further testing was performed.  However, he reports that his symptoms persisted in spite of quitting smoking.  Past Medical History:  Diagnosis Date   Abdominal pain    all over pain   ADHD (attention deficit hyperactivity disorder)    Asthma    Fatigue    Goiter    Hypothyroidism, acquired, autoimmune    Iron excess    Isosexual precocity    Nausea and vomiting    Physical growth delay    SOB (shortness of breath) on exertion    Thyroiditis, autoimmune     Past Surgical History:  Procedure Laterality Date   CHALAZION EXCISION     ESOPHAGOGASTRODUODENOSCOPY  03/19/2016   per Dr. Legrand, normal    FRENULECTOMY, LINGUAL     TONSILLECTOMY AND ADENOIDECTOMY       Current Outpatient Medications  Medication Sig  Dispense Refill   albuterol  (VENTOLIN  HFA) 108 (90 Base) MCG/ACT inhaler Inhale 2 puffs into the lungs every 4 (four) hours as needed for wheezing or shortness of breath. 8 g 2   budeson-glycopyrrolate-formoterol  (BREZTRI  AEROSPHERE) 160-9-4.8 MCG/ACT AERO inhaler Inhale 2 puffs into the lungs in the morning and at bedtime. 10.7 g 0   Cholecalciferol (PA VITAMIN D -3 GUMMY PO) Take 1 each by mouth at bedtime.     Multiple Vitamin (MULTIVITAMIN ADULT PO) Take by mouth. (Patient not taking: Reported on 06/17/2024)     pantoprazole  (PROTONIX ) 40 MG tablet Take 1 tablet (40 mg total) by mouth daily. (Patient not taking: Reported on 06/17/2024) 30 tablet 3   No current facility-administered medications for this visit.    Allergies:   Patient has no known allergies.    Social History:  The patient  reports that he quit smoking about 10 years ago. His smoking use included cigarettes. He started smoking about 12 years ago. He has a 7 pack-year smoking history. He has never used smokeless tobacco. He reports that he does not currently use alcohol. He reports current drug use. Drugs: Other-see comments and Marijuana.   Family History:  The patient's family history includes Heart disease in his maternal grandmother and paternal  grandfather; Stroke in his paternal uncle; Thyroid  disease in his maternal grandmother and paternal aunt.    ROS:  Please see the history of present illness.   Otherwise, review of systems are positive for none.   All other systems are reviewed and negative.    PHYSICAL EXAM: VS:  BP 106/78 (BP Location: Right Arm, Cuff Size: Normal)   Pulse 70   Ht 5' 4 (1.626 m)   Wt 167 lb 6 oz (75.9 kg)   SpO2 98%   BMI 28.73 kg/m  , BMI Body mass index is 28.73 kg/m. GEN: Well nourished, well developed, in no acute distress  HEENT: normal  Neck: no JVD, carotid bruits, or masses Cardiac: RRR; no murmurs, rubs, or gallops,no edema  Respiratory:  clear to auscultation bilaterally,  normal work of breathing GI: soft, nontender, nondistended, + BS MS: no deformity or atrophy  Skin: warm and dry, no rash Neuro:  Strength and sensation are intact Psych: euthymic mood, full affect   EKG:  EKG is ordered today. The ekg ordered today demonstrates : Normal sinus rhythm Low voltage QRS When compared with ECG of 26-Aug-2015 08:33, PREVIOUS ECG IS PRESENT    Recent Labs: 12/05/2023: ALT 33; BUN 12; Creatinine, Ser 0.97; Hemoglobin 15.9; Platelets 271; Potassium 4.4; Sodium 142 04/15/2024: TSH 3.210    Lipid Panel    Component Value Date/Time   CHOL 200 (H) 04/15/2024 1529   TRIG 195 (H) 04/15/2024 1529   HDL 31 (L) 04/15/2024 1529   CHOLHDL 6.5 (H) 04/15/2024 1529   LDLCALC 134 (H) 04/15/2024 1529      Wt Readings from Last 3 Encounters:  06/17/24 167 lb 6 oz (75.9 kg)  06/03/24 166 lb 6.4 oz (75.5 kg)  04/15/24 165 lb (74.8 kg)          06/17/2024    9:08 AM  PAD Screen  Previous PAD dx? No  Previous surgical procedure? No  Pain with walking? No  Feet/toe relief with dangling? No  Painful, non-healing ulcers? No  Extremities discolored? No      ASSESSMENT AND PLAN:  1.  Exertional chest pain: Possible deconditioning.  His baseline EKG is normal and cardiac exam is unremarkable.  Will obtain a treadmill stress test for evaluation.  2.  Shortness of breath and cough: Will get an echocardiogram to evaluate LV systolic function or other etiologies that might be contributing.  3.  Hyperlipidemia: His LDL was 134.  Recommend healthy lifestyle changes for now and can consider treatment of this with a statin if LDL remains above 100 considering his family history of premature CAD.  He is too young for CT calcium scoring.    Disposition:   FU with me as needed.  Signed,  Deatrice Cage, MD  06/17/2024 9:28 AM     [facility]

## 2024-06-17 NOTE — Patient Instructions (Addendum)
 Medication Instructions:  No changes *If you need a refill on your cardiac medications before your next appointment, please call your pharmacy*  Lab Work: None ordered If you have labs (blood work) drawn today and your tests are completely normal, you will receive your results only by: MyChart Message (if you have MyChart) OR A paper copy in the mail If you have any lab test that is abnormal or we need to change your treatment, we will call you to review the results.  Testing/Procedures: Your physician has requested that you have an echocardiogram. Echocardiography is a painless test that uses sound waves to create images of your heart. It provides your doctor with information about the size and shape of your heart and how well your heart's chambers and valves are working.   You may receive an ultrasound enhancing agent through an IV if needed to better visualize your heart during the echo. This procedure takes approximately one hour.  There are no restrictions for this procedure.  This will take place at [address] [facility] [address], [place] [address]  Please note: We ask at that you not bring children with you during ultrasound (echo/ vascular) testing. Due to room size and safety concerns, children are not allowed in the ultrasound rooms during exams. Our front office staff cannot provide observation of children in our lobby area while testing is being conducted. An adult accompanying a patient to their appointment will only be allowed in the ultrasound room at the discretion of the ultrasound technician under special circumstances. We apologize for any inconvenience.   Follow-Up: At [facility], you and your health needs are our priority.  As part of our continuing mission to provide you with exceptional heart care, our providers are all part of one team.  This team includes your primary Cardiologist (physician) and Advanced Practice Providers or APPs  (Physician Assistants and Nurse Practitioners) who all work together to provide you with the care you need, when you need it.  Your next appointment:   Follow up as needed  We recommend signing up for the patient portal called MyChart.  Sign up information is provided on this After Visit Summary.  MyChart is used to connect with patients for Virtual Visits (Telemedicine).  Patients are able to view lab/test results, encounter notes, upcoming appointments, etc.  Non-urgent messages can be sent to your provider as well.   To learn more about what you can do with MyChart, go to [email or web address].   Other Instructions Your provider has ordered a exercise tolerance test. This test will evaluate the blood supply to your heart muscle during periods of exercise and rest. For this test, you will raise your heart rate by walking on a treadmill at different levels.   you may eat a light breakfast/ lunch prior to your procedure no caffeine for 24 hours prior to your test (coffee, tea, soft drinks, or chocolate)  no smoking/ vaping for 4 hours prior to your test you may take your regular medications the day of your test except for:   - hold: nothing to hold bring any inhalers with you to your test wear comfortable clothing & tennis/ non-skid shoes to walk on the treadmill

## 2024-07-12 ENCOUNTER — Other Ambulatory Visit: Payer: Self-pay | Admitting: Cardiology

## 2024-07-12 ENCOUNTER — Ambulatory Visit
Admission: RE | Admit: 2024-07-12 | Discharge: 2024-07-12 | Disposition: A | Discharge status: Home / Self Care | Payer: Self-pay | Source: Ambulatory Visit | Attending: Cardiovascular Disease | Admitting: Cardiovascular Disease

## 2024-07-12 DIAGNOSIS — R079 Chest pain, unspecified: Secondary | ICD-10-CM | POA: Insufficient documentation

## 2024-07-12 LAB — EXERCISE TOLERANCE TEST
Angina Index: 0
Base ST Depression (mm): 0 mm
Duke Treadmill Score: 7
Estimated workload: 8.1
Exercise duration (min): 7 min
Exercise duration (sec): 7 s
MPHR: 191 {beats}/min
Peak HR: 171 {beats}/min
Percent HR: 89 %
RPE: 11
Rest HR: 111 {beats}/min
ST Depression (mm): 0 mm

## 2024-07-12 NOTE — Progress Notes (Signed)
     Keith Lawrence presented for a treadmill stress test today.  I Aseel Truxillo, NP, provided direct supervision and was present during the stress portion of the study today, which was completed without significant symptoms, immediate complications, or acute ST/T changes on ECG.  Hermilo Dutter, NP  07/12/2024, 8:45 AM

## 2024-07-15 ENCOUNTER — Ambulatory Visit: Payer: Self-pay | Admitting: Cardiovascular Disease

## 2024-07-19 ENCOUNTER — Ambulatory Visit
Admission: EM | Admit: 2024-07-19 | Discharge: 2024-07-19 | Disposition: A | Discharge status: Home / Self Care | Payer: Self-pay | Attending: Nurse Practitioner | Admitting: Nurse Practitioner

## 2024-07-19 ENCOUNTER — Encounter: Payer: Self-pay | Admitting: Emergency Medicine

## 2024-07-19 DIAGNOSIS — W57XXXA Bitten or stung by nonvenomous insect and other nonvenomous arthropods, initial encounter: Secondary | ICD-10-CM

## 2024-07-19 DIAGNOSIS — L03116 Cellulitis of left lower limb: Secondary | ICD-10-CM

## 2024-07-19 DIAGNOSIS — S70362A Insect bite (nonvenomous), left thigh, initial encounter: Secondary | ICD-10-CM

## 2024-07-19 MED ORDER — DOXYCYCLINE HYCLATE 100 MG PO CAPS: 100.0000 mg | ORAL_CAPSULE | Freq: Two times a day (BID) | ORAL | 0 refills | Status: AC

## 2024-07-19 NOTE — ED Triage Notes (Signed)
 Pt c/o a insect bite to left inner thigh  St's he first noticed it yesterday.  Pt denies pain or itching

## 2024-07-19 NOTE — Discharge Instructions (Addendum)
 You were seen today for evaluation of a small lesion on your upper left thigh that appears to be an insect or spider bite with mild surrounding skin infection (cellulitis). You do not have a fever or signs of a serious infection at this time. You have been prescribed an antibiotic called doxycycline  to take twice a day for 7 days to help treat the infection. Take the medication exactly as prescribed and complete the full course, even if the area starts to look better before you finish it. Taking it with food and a full glass of water can help reduce stomach upset, but avoid lying down for at least 30 minutes after taking it. Do not take it at the same time as dairy products or vitamins containing calcium, magnesium, or iron, as these can interfere with absorption. Keep the area clean by gently washing it with mild soap and water once or twice a day, then patting it dry. Do not apply ointments or cover it with a bandage. Avoid scratching, squeezing, or rubbing the area, and try to wear loose clothing to prevent irritation. You should start to notice improvement in a few days. Follow up with your primary care provider if the redness, swelling, or tenderness worsens, if the wound begins draining pus, or if it does not begin to improve after 2-3 days of antibiotics. Go to the emergency department immediately if you develop fever, chills, rapid spreading redness, severe pain, weakness, dizziness, or any other concerning symptoms.

## 2024-07-19 NOTE — ED Provider Notes (Signed)
 [name]    CSN: [id number] Arrival date & time: 07/19/24  1235      History   Chief Complaint Chief Complaint  Patient presents with   Insect Bite    HPI Keith Lawrence is a 28 y.o. male.   Discussed the use of AI scribe software for clinical note transcription with the patient, who gave verbal consent to proceed.   The patient presents with a suspected spider bite that was first noticed yesterday morning on the upper inner left thigh. The area was initially red with a white center and is tender to touch. The patient applied Neosporin, which helped reduce some of the redness and swelling. He reports feeling generally unwell, with fatigue and body aches, but denies fever or chills. There is no drainage from the site, and the patient has no history of diabetes.  The following sections of the patient's history were reviewed and updated as appropriate: allergies, current medications, past family history, past medical history, past social history, past surgical history, and problem list.      Picture of the affected area that patient took with his phone dated 07/18/2024. Patient states that the redness has improved from yesterday.    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 Legrand, normal    FRENULECTOMY, LINGUAL     TONSILLECTOMY AND ADENOIDECTOMY         Home Medications    Prior to Admission medications   Medication Sig Start Date End Date Taking? Authorizing Provider  doxycycline  (VIBRAMYCIN ) 100 MG capsule Take 1 capsule (100 mg total) by mouth 2 (two) times daily. 07/19/24  Yes Iola Lukes, FNP  albuterol  (VENTOLIN  HFA) 108 (90 Base) MCG/ACT inhaler Inhale 2 puffs into the lungs every 4 (four) hours as needed for wheezing or shortness of breath. 01/29/24   Kasa, Kurian, MD  budeson-glycopyrrolate-formoterol  (BREZTRI  AEROSPHERE) 160-9-4.8 MCG/ACT AERO inhaler Inhale 2 puffs into the lungs in the morning and at bedtime. 02/03/24   Kasa, Kurian, MD  Cholecalciferol (PA VITAMIN D -3 GUMMY PO) Take 1 each by mouth at bedtime.    [provider]  Multiple Vitamin (MULTIVITAMIN ADULT PO) Take by mouth. Patient not taking: Reported on 06/17/2024    [provider]  pantoprazole  (PROTONIX ) 40 MG tablet Take 1 tablet (40 mg total) by mouth daily. Patient not  taking: Reported on 06/17/2024 12/04/23   Knute Thersia Bitters, FNP    Family History Family History  Problem Relation Age of Onset   Thyroid  disease Maternal Grandmother    Heart disease Maternal Grandmother    Thyroid  disease Paternal Aunt    Heart disease Paternal Grandfather    Stroke Paternal Uncle    Diabetes Neg Hx    Colon cancer Neg Hx     Social History Social History   Tobacco Use   Smoking status: Former    Current packs/day: 0.00    Average packs/day: 1 pack/day for 7.0 years (7.0 ttl pk-yrs)     Types: Cigarettes    Start date: 01/11/2012    Quit date: 03/09/2014    Years since quitting: 10.3   Smokeless tobacco: Never  Vaping Use   Vaping status: Never Used  Substance Use Topics   Alcohol use: Yes   Drug use: Yes    Types: Other-see comments, Marijuana    Comment: Stopped 5 months ago     Allergies   Patient has no known allergies.   Review of Systems Review of Systems  Skin:  Positive for wound.  All other systems reviewed and are negative.    Physical Exam Triage Vital Signs ED Triage Vitals  Encounter Vitals Group     BP 07/19/24 1311 127/80     Girls Systolic BP Percentile --      Girls Diastolic BP Percentile --      Boys Systolic BP Percentile --      Boys Diastolic BP Percentile --      Pulse Rate 07/19/24 1311 77     Resp 07/19/24 1311 16     Temp 07/19/24 1311 98.3 F (36.8 C)     Temp Source 07/19/24 1311 Oral     SpO2 07/19/24 1311 95 %     Weight 07/19/24 1312 165 lb (74.8 kg)     Height 07/19/24 1312 5' 4 (1.626 m)     Head Circumference --      Peak Flow --      Pain Score 07/19/24 1312 0     Pain Loc --      Pain Education --      Exclude from Growth Chart --    No data found.  Updated Vital Signs BP 127/80 (BP Location: Left Arm)   Pulse 77   Temp 98.3 F (36.8 C) (Oral)   Resp 16   Ht 5' 4 (1.626 m)   Wt 165 lb (74.8 kg)   SpO2 95%   BMI 28.32 kg/m   Visual Acuity Right Eye Distance:   Left Eye Distance:   Bilateral Distance:    Right Eye Near:   Left Eye Near:    Bilateral Near:     Physical Exam Vitals reviewed. Exam conducted with a chaperone present Rick, RN).  Constitutional:      General: He is awake. He is not in acute distress.    Appearance: Normal appearance. He is well-developed. He is not ill-appearing, toxic-appearing or diaphoretic.  HENT:     Head: Normocephalic.     Right Ear: Hearing normal.     Left Ear: Hearing normal.     Nose: Nose normal.     Mouth/Throat:     Mouth: Mucous membranes are  moist.  Eyes:     General: Vision grossly intact.     Conjunctiva/sclera: Conjunctivae normal.  Cardiovascular:     Rate and Rhythm: Normal rate and  regular rhythm.     Heart sounds: Normal heart sounds.  Pulmonary:     Effort: Pulmonary effort is normal.     Breath sounds: Normal breath sounds and air entry.  Musculoskeletal:        General: Normal range of motion.     Cervical back: Normal range of motion and neck supple.  Skin:    General: Skin is warm and dry.     Findings: Wound present.     Comments: A 0.4 cm rounded lesion is observed on the inner upper aspect of the left thigh with mild surrounding erythema. There is no spontaneous or expressible drainage, and no underlying fluctuance is appreciated.   Neurological:     General: No focal deficit present.     Mental Status: He is alert and oriented to person, place, and time.  Psychiatric:        Speech: Speech normal.        Behavior: Behavior is cooperative.      UC Treatments / Results  Labs (all labs ordered are listed, but only abnormal results are displayed) Labs Reviewed - No data to display  EKG   Radiology No results found.  Procedures Procedures (including critical care time)  Medications Ordered in UC Medications - No data to display  Initial Impression / Assessment and Plan / UC Course  I have reviewed the triage vital signs and the nursing notes.  Pertinent labs & imaging results that were available during my care of the patient were reviewed by me and considered in my medical decision making (see chart for details).    The patient presents with a small, localized lesion on the inner upper left thigh consistent with a suspected insect or spider bite. Exam reveals a 0.4 cm rounded lesion with mild surrounding erythema, without fluctuance, drainage, or evidence of abscess formation. The patient is afebrile, nontoxic, and in no acute distress. There are no systemic symptoms such as fever, chills, or  spreading erythema.  Findings are most consistent with a localized insect bite with mild cellulitis. Treatment initiated with doxycycline  BID for 7 days. The patient was instructed to keep the area clean with mild soap and water, avoid applying ointments or covering the lesion, and monitor for any changes. Advised to follow up as needed if symptoms do not improve or worsen, and to seek emergency care if fever, spreading redness, increased pain, or drainage develops.  Today's evaluation has revealed no signs of a dangerous process. Discussed diagnosis with patient and/or guardian. Patient and/or guardian aware of their diagnosis, possible red flag symptoms to watch out for and need for close follow up. Patient and/or guardian understands verbal and written discharge instructions. Patient and/or guardian comfortable with plan and disposition.  Patient and/or guardian has a clear mental status at this time, good insight into illness (after discussion and teaching) and has clear judgment to make decisions regarding their care  Documentation was completed with the aid of voice recognition software. Transcription may contain typographical errors.   Final Clinical Impressions(s) / UC Diagnoses   Final diagnoses:  Insect bite of left thigh, initial encounter  Cellulitis of left thigh     Discharge Instructions      You were seen today for evaluation of a small lesion on your upper left thigh that appears to be an insect or spider bite with mild surrounding skin infection (cellulitis). You do not have a fever or signs of a serious infection at this time. You have  been prescribed an antibiotic called doxycycline  to take twice a day for 7 days to help treat the infection. Take the medication exactly as prescribed and complete the full course, even if the area starts to look better before you finish it. Taking it with food and a full glass of water can help reduce stomach upset, but avoid lying down for at  least 30 minutes after taking it. Do not take it at the same time as dairy products or vitamins containing calcium, magnesium, or iron, as these can interfere with absorption. Keep the area clean by gently washing it with mild soap and water once or twice a day, then patting it dry. Do not apply ointments or cover it with a bandage. Avoid scratching, squeezing, or rubbing the area, and try to wear loose clothing to prevent irritation. You should start to notice improvement in a few days. Follow up with your primary care provider if the redness, swelling, or tenderness worsens, if the wound begins draining pus, or if it does not begin to improve after 2-3 days of antibiotics. Go to the emergency department immediately if you develop fever, chills, rapid spreading redness, severe pain, weakness, dizziness, or any other concerning symptoms.      ED Prescriptions     Medication Sig Dispense Auth. Provider   doxycycline  (VIBRAMYCIN ) 100 MG capsule Take 1 capsule (100 mg total) by mouth 2 (two) times daily. 14 capsule Iola Lukes, FNP      PDMP not reviewed this encounter.   Iola Lukes, [place] 07/19/24 [phone]

## 2024-07-21 ENCOUNTER — Other Ambulatory Visit: Payer: Self-pay

## 2024-07-21 ENCOUNTER — Emergency Department (HOSPITAL_BASED_OUTPATIENT_CLINIC_OR_DEPARTMENT_OTHER)
Admission: EM | Admit: 2024-07-21 | Discharge: 2024-07-22 | Disposition: A | Discharge status: Home / Self Care | Payer: Self-pay | Attending: Emergency Medicine | Admitting: Emergency Medicine

## 2024-07-21 ENCOUNTER — Emergency Department (HOSPITAL_BASED_OUTPATIENT_CLINIC_OR_DEPARTMENT_OTHER): Payer: Self-pay

## 2024-07-21 ENCOUNTER — Encounter (HOSPITAL_BASED_OUTPATIENT_CLINIC_OR_DEPARTMENT_OTHER): Payer: Self-pay | Admitting: Emergency Medicine

## 2024-07-21 DIAGNOSIS — N492 Inflammatory disorders of scrotum: Secondary | ICD-10-CM | POA: Insufficient documentation

## 2024-07-21 LAB — URINALYSIS, ROUTINE W REFLEX MICROSCOPIC
Bilirubin Urine: NEGATIVE
Glucose, UA: NEGATIVE mg/dL
Hgb urine dipstick: NEGATIVE
Ketones, ur: NEGATIVE mg/dL
Nitrite: NEGATIVE
Protein, ur: NEGATIVE mg/dL
Specific Gravity, Urine: 1.027 (ref 1.005–1.030)
pH: 6.5 (ref 5.0–8.0)

## 2024-07-21 MED ORDER — ACETAMINOPHEN 500 MG PO TABS
1000.0000 mg | ORAL_TABLET | Freq: Once | ORAL | Status: DC
Start: 2024-07-21 — End: 2024-07-22
  Filled 2024-07-21: qty 2

## 2024-07-21 NOTE — ED Triage Notes (Signed)
 Pt c/o pain and swelling to right groin area after being bitten by a spider earlier this week, treated at [facility] and given rx for doxycycline  w/o improvement. Pt reports 4-5 doses of oral abx. No other symptoms.

## 2024-07-21 NOTE — ED Provider Notes (Incomplete)
 [place] EMERGENCY DEPARTMENT AT [facility] Provider Note   CSN: [id number] Arrival date & time: 07/21/24  2114     Patient presents with: Insect Bite   Keith Lawrence is a 28 y.o. male.  28 year old male presents ED with complaints of spider bite that occurred approximately on Saturday.  Patient was seen at urgent care Saturday and given doxycycline .  Patient reports yesterday he had a new pain to his right testicle and noticed a mass on the scrotal wall above the right testicle.  The initial spider bite was on the left medial thigh.  Patient advises he is more concerned with the testicle and mass in the testicle compared to the thigh.  Patient denies any fevers, chills, drainage, redness, urinary symptoms, chest pain, shortness of breath.    Prior to Admission medications   Medication Sig Start Date End Date Taking? Authorizing Provider  albuterol  (VENTOLIN  HFA) 108 (90 Base) MCG/ACT inhaler Inhale 2 puffs into the lungs every 4 (four) hours as needed for wheezing or shortness of breath. 01/29/24   Kasa, Kurian, MD  budeson-glycopyrrolate-formoterol  (BREZTRI  AEROSPHERE) 160-9-4.8 MCG/ACT AERO inhaler Inhale 2 puffs into the lungs in the morning and at bedtime. 02/03/24   Kasa, Kurian, MD  Cholecalciferol (PA VITAMIN D -3 GUMMY PO) Take 1 each by mouth at bedtime.    [provider]  doxycycline  (VIBRAMYCIN ) 100 MG capsule Take 1 capsule (100 mg total) by mouth 2 (two) times daily. 07/19/24   Murrill, Samantha, FNP  Multiple Vitamin (MULTIVITAMIN ADULT PO) Take by mouth. Patient not taking: Reported on 06/17/2024    [provider]  pantoprazole  (PROTONIX ) 40 MG tablet Take 1 tablet (40 mg total) by mouth daily. Patient not taking: Reported on 06/17/2024 12/04/23   Knute Thersia Bitters, FNP    Allergies: Patient has no known allergies.    Review of Systems  Genitourinary:  Positive for scrotal swelling and testicular pain.  All other systems reviewed and  are negative.   Updated Vital Signs BP 128/86 (BP Location: Right Arm)   Pulse (!) 111   Temp 98.4 F (36.9 C) (Oral)   Resp 17   Ht 5' 4 (1.626 m)   Wt 72.6 kg   SpO2 99%   BMI 27.46 kg/m   Physical Exam Vitals and nursing note reviewed.  Constitutional:      Appearance: Normal appearance.  HENT:     Head: Normocephalic and atraumatic.     Nose: Nose normal.  Eyes:     Extraocular Movements: Extraocular movements intact.     Conjunctiva/sclera: Conjunctivae normal.     Pupils: Pupils are equal, round, and reactive to light.  Cardiovascular:     Rate and Rhythm: Normal rate.  Pulmonary:     Effort: Pulmonary effort is normal. No respiratory distress.     Breath sounds: Normal breath sounds.  Genitourinary:    Penis: Normal.   Musculoskeletal:        General: Normal range of motion.     Cervical back: Normal range of motion.  Skin:    General: Skin is warm.     Capillary Refill: Capillary refill takes less than 2 seconds.  Neurological:     General: No focal deficit present.     Mental Status: He is alert.  Psychiatric:        Mood and Affect: Mood normal.        Behavior: Behavior normal.     (all labs ordered are listed, but only abnormal results  are displayed) Labs Reviewed  URINALYSIS, ROUTINE W REFLEX MICROSCOPIC    EKG: None  Radiology: No results found.   Procedures   Medications Ordered in the ED  acetaminophen  (TYLENOL ) tablet 1,000 mg (1,000 mg Oral Patient Refused/Not Given 07/21/24 2327)    28 y.o. male presents to the ED with complaints of scrotal pain and testicular pain, this involves an extensive number of treatment options, and is a complaint that carries with it a high risk of complications and morbidity.  The differential diagnosis includes testicular torsion, orchitis, STI, scrotal abscess, cellulitis (Ddx)  On arrival pt is nontoxic, vitals tachycardic initially on exam.   I ordered medication Tylenol  for pain  Lab Tests:   I Ordered, reviewed, and interpreted labs, which included: UA  Imaging Studies ordered:  I ordered imaging studies which included scrotal ultrasound with Doppler, I independently visualized and interpreted imaging which showed ***  ED Course:   Nurse was present for entire scrotal exam.  28 year old male presents ED with complaints of scrotal pain x 1 day.  Patient advises he was bit by a spider on Saturday, he did not see the spider but reports he believes it is a spider bite.  Patient seen in urgent care and given Doxy.  The reported bite is on the medial portion of the left thigh.  There is no pain to palpation, redness, warmth and it appears to be healing appropriately.  Patient denies any pain to the area and reports he is not concerned with the thigh.  Patient has a new pain that began yesterday he reports right testicle pain and a mass above the testicle on the scrotal wall.  Patient believes this is also a spider bite.  There is a solid mass without any drainage but it is palpable and patient has associated pain with palpation.  Patient also has pain to the right testicle on palpation.  No significant swelling noted throughout testicle and no redness.  No penile swelling or discharge.  Patient reports there is no chance for STI.  Due to onset of testicular pain and potential abscess of scrotal wall ultrasound will be ordered to rule out torsion and visual mass.  Portions of this note were generated with Scientist, clinical (histocompatibility and immunogenetics). Dictation errors may occur despite best attempts at proofreading.   Final diagnoses:  None    ED Discharge Orders     None

## 2024-07-21 NOTE — ED Notes (Signed)
 Ultrasound at bedside

## 2024-07-21 NOTE — ED Provider Notes (Signed)
 [place] EMERGENCY DEPARTMENT AT [facility] Provider Note   CSN: [id number] Arrival date & time: 07/21/24  2114     Patient presents with: Insect Bite   Keith Lawrence is a 28 y.o. male.  28 year old male presents ED with complaints of spider bite that occurred  Saturday.  Patient was seen at urgent care Saturday and given doxycycline .  Patient reports yesterday he had a new pain to his right testicle and noticed a mass on the scrotal wall above the right testicle.  The initial spider bite was on the left medial thigh.  Patient advises he is more concerned with the testicle and mass in the testicle compared to the thigh.  Patient denies any fevers, chills, drainage, redness, urinary symptoms, chest pain, shortness of breath.    Prior to Admission medications   Medication Sig Start Date End Date Taking? Authorizing Provider  cephALEXin  (KEFLEX ) 500 MG capsule Take 1 capsule (500 mg total) by mouth 4 (four) times daily. 07/22/24  Yes Myriam Fonda RAMAN, PA-C  albuterol  (VENTOLIN  HFA) 108 (90 Base) MCG/ACT inhaler Inhale 2 puffs into the lungs every 4 (four) hours as needed for wheezing or shortness of breath. 01/29/24   Kasa, Kurian, MD  budeson-glycopyrrolate-formoterol  (BREZTRI  AEROSPHERE) 160-9-4.8 MCG/ACT AERO inhaler Inhale 2 puffs into the lungs in the morning and at bedtime. 02/03/24   Kasa, Kurian, MD  Cholecalciferol (PA VITAMIN D -3 GUMMY PO) Take 1 each by mouth at bedtime.    [provider]  doxycycline  (VIBRAMYCIN ) 100 MG capsule Take 1 capsule (100 mg total) by mouth 2 (two) times daily. 07/19/24   Murrill, Samantha, FNP  Multiple Vitamin (MULTIVITAMIN ADULT PO) Take by mouth. Patient not taking: Reported on 06/17/2024    [provider]  pantoprazole  (PROTONIX ) 40 MG tablet Take 1 tablet (40 mg total) by mouth daily. Patient not taking: Reported on 06/17/2024 12/04/23   Knute Thersia Bitters, FNP    Allergies: Patient has no known allergies.     Review of Systems  Genitourinary:  Positive for scrotal swelling and testicular pain.  All other systems reviewed and are negative.   Updated Vital Signs BP 117/71   Pulse 88   Temp 98.4 F (36.9 C) (Oral)   Resp 18   Ht 5' 4 (1.626 m)   Wt 72.6 kg   SpO2 98%   BMI 27.46 kg/m   Physical Exam Vitals and nursing note reviewed.  Constitutional:      Appearance: Normal appearance.  HENT:     Head: Normocephalic and atraumatic.     Nose: Nose normal.  Eyes:     Extraocular Movements: Extraocular movements intact.     Conjunctiva/sclera: Conjunctivae normal.     Pupils: Pupils are equal, round, and reactive to light.  Cardiovascular:     Rate and Rhythm: Normal rate.  Pulmonary:     Effort: Pulmonary effort is normal. No respiratory distress.     Breath sounds: Normal breath sounds.  Genitourinary:    Penis: Normal.   Musculoskeletal:        General: Normal range of motion.     Cervical back: Normal range of motion.  Skin:    General: Skin is warm.     Capillary Refill: Capillary refill takes less than 2 seconds.  Neurological:     General: No focal deficit present.     Mental Status: He is alert.  Psychiatric:        Mood and Affect: Mood normal.  Behavior: Behavior normal.     (all labs ordered are listed, but only abnormal results are displayed) Labs Reviewed  URINALYSIS, ROUTINE W REFLEX MICROSCOPIC - Abnormal; Notable for the following components:      Result Value   Leukocytes,Ua TRACE (*)    Bacteria, UA RARE (*)    All other components within normal limits    EKG: None  Radiology: US  SCROTUM W/DOPPLER Result Date: 07/22/2024 CLINICAL DATA:  Right testicular pain, question abscess after spider bite EXAM: SCROTAL ULTRASOUND DOPPLER ULTRASOUND OF THE TESTICLES TECHNIQUE: Complete ultrasound examination of the testicles, epididymis, and other scrotal structures was performed. Color and spectral Doppler ultrasound were also utilized to evaluate  blood flow to the testicles. COMPARISON:  None Available. FINDINGS: Right testicle Measurements: 4.2 x 1.9 x 3.3 cm. No mass or microlithiasis visualized. Left testicle Measurements: 3.9 x 2.4 x 2.5 cm. No mass or microlithiasis visualized. Right epididymis:  Normal in size and appearance. Left epididymis:  Normal in size and appearance. Hydrocele:  None visualized. Varicocele:  None visualized. Pulsed Doppler interrogation of both testes demonstrates normal low resistance arterial and venous waveforms bilaterally. Other: Complex fluid collection noted within the right scrotal wall measuring 1.9 x 1.5 x 0.9 cm. Surrounding hyperemia. Findings concerning for scrotal wall abscess. IMPRESSION: No testicular abnormality.  No torsion. Irregular fluid collection in the right scrotal wall with surrounding hypervascularity concerning for abscess. Electronically Signed   By: Franky Crease M.D.   On: 07/22/2024 00:11     .Incision and Drainage  Date/Time: 07/22/2024 12:45 AM  Performed by: Myriam Fonda RAMAN, PA-C Authorized by: Myriam Fonda RAMAN, PA-C   Consent:    Consent obtained:  Verbal   Consent given by:  Patient   Risks, benefits, and alternatives were discussed: yes     Risks discussed:  Bleeding, infection, incomplete drainage and damage to other organs Universal protocol:    Procedure explained and questions answered to patient or proxy's satisfaction: yes     Relevant documents present and verified: yes     Imaging studies available: yes     Site/side marked: yes     Immediately prior to procedure, a time out was called: yes     Patient identity confirmed:  Verbally with patient and arm band Location:    Type:  Abscess   Size:  2x3cm   Location:  Anogenital   Anogenital location:  Scrotal wall Pre-procedure details:    Skin preparation:  Chlorhexidine with alcohol and povidone-iodine Sedation:    Sedation type:  None Anesthesia:    Anesthesia method:  Local infiltration   Local  anesthetic:  Lidocaine  1% w/o epi Procedure type:    Complexity:  Simple Procedure details:    Ultrasound guidance: no     Needle aspiration: no     Incision types:  Single straight   Incision depth:  Dermal   Wound management:  Probed and deloculated and irrigated with saline   Drainage:  Purulent and bloody   Drainage amount:  Moderate   Wound treatment:  Wound left open   Packing materials:  None Post-procedure details:    Procedure completion:  Tolerated well, no immediate complications    Medications Ordered in the ED  lidocaine  (PF) (XYLOCAINE ) 1 % injection 5 mL (5 mLs Intradermal Given 07/22/24 0049)  HYDROcodone -acetaminophen  (NORCO/VICODIN) 5-325 MG per tablet 2 tablet (2 tablets Oral Given 07/22/24 0049)    28 y.o. male presents to the ED with complaints of scrotal pain and testicular  pain, this involves an extensive number of treatment options, and is a complaint that carries with it a high risk of complications and morbidity.  The differential diagnosis includes testicular torsion, orchitis, STI, scrotal abscess, cellulitis (Ddx)  On arrival pt is nontoxic, vitals tachycardic initially on exam. Decreased on repeat.    I ordered medication norco for pain  Lab Tests:  I Ordered, reviewed, and interpreted labs, which included: UA  Imaging Studies ordered:  I ordered imaging studies which included scrotal ultrasound with Doppler, I independently visualized and interpreted imaging which showed no torsion or other testicular abnormality, suggestive of scrotal wall abscess.   ED Course:   Nurse was present for entire scrotal exam.  28 year old male presents ED with complaints of scrotal pain x 1 day.  Patient advises he was bit by a spider on Saturday, he did not see the spider but reports he believes it is a spider bite.  Patient seen in urgent care and given Doxy.  The reported bite is on the medial portion of the left thigh.  There is no pain to palpation, redness,  warmth and it appears to be healing appropriately.  Patient denies any pain to the area and reports he is not concerned with the thigh.  Patient has a new pain that began yesterday he reports right testicle pain and a mass above the testicle on the scrotal wall.  Patient believes this is also a spider bite.  There is a solid mass without any drainage but it is palpable and patient has associated pain with palpation.  Patient also has pain to the right testicle on palpation.  No significant swelling noted throughout testicle and no redness.  No penile swelling or discharge.  Patient reports there is no chance for STI.  Due to onset of testicular pain and potential abscess of scrotal wall ultrasound will be ordered to rule out torsion and visual mass.   No torsion was noted on ultrasound.  Patient did have abscess noted on ultrasound. Patient agreed to have abscess drained and have urology follow up for further evaluation.   Patient was not driver and was given pain management in ED.  Nurse was present for entire I&D. Patient was cleaned and draped. A very small incision was made and moderate amount of purulent drainage and blood was removed. The patient tolerated the procedure well. Abscess was irrigated and all possible purulence removed. Patient was given care instructions, follow up instructions, return precautions, and advised to take Keflex . Patient agreed to treatment plan and was comfortable with discharge.      Portions of this note were generated with Scientist, clinical (histocompatibility and immunogenetics). Dictation errors may occur despite best attempts at proofreading.   Final diagnoses:  Scrotal abscess    ED Discharge Orders          Ordered    cephALEXin  (KEFLEX ) 500 MG capsule  4 times daily        07/22/24 0042               Myriam Fonda RAMAN, PA-C 07/23/24 [id number]    Randol Simmonds, MD 07/24/24 0900

## 2024-07-22 MED ORDER — HYDROCODONE-ACETAMINOPHEN 5-325 MG PO TABS
2.0000 | ORAL_TABLET | Freq: Once | ORAL | Status: AC
  Administered 2024-07-22: 2 via ORAL
  Filled 2024-07-22: qty 2

## 2024-07-22 MED ORDER — CEPHALEXIN 500 MG PO CAPS: 500.0000 mg | ORAL_CAPSULE | Freq: Four times a day (QID) | ORAL | 0 refills | Status: AC

## 2024-07-22 MED ORDER — LIDOCAINE HCL (PF) 1 % IJ SOLN
5.0000 mL | Freq: Once | INTRAMUSCULAR | Status: AC
  Administered 2024-07-22: 5 mL via INTRADERMAL
  Filled 2024-07-22: qty 5

## 2024-07-22 NOTE — Discharge Instructions (Addendum)
 Urinalysis and imaging were reassuring today.  It did note that there was a abscess on the scrotum.  We drained the abscess of the scrotum in the ED tonight.  Please monitor for any signs of infection including worsening redness, warmth, increasing discharge.  If these symptoms occur or other concerning symptoms such as swelling or urinary symptoms occur please return to ED for further evaluation.  I have provided a urology referral, please call to make an appointment for further evaluation of abscess.  I have prescribed an antibiotic for you to take.  Please take full course of antibiotic.  Please return to ED if you have any concerning symptoms.

## 2024-07-22 NOTE — ED Notes (Signed)
 Reviewed discharge instructions, medications, and home care with pt. Pt verbalized understanding and had no further questions. Pt exited ED without complications.

## 2024-07-22 NOTE — ED Notes (Signed)
 ED Provider at bedside.

## 2024-07-29 ENCOUNTER — Ambulatory Visit: Payer: Self-pay

## 2024-09-16 ENCOUNTER — Ambulatory Visit: Payer: Self-pay | Attending: Cardiovascular Disease

## 2024-09-16 DIAGNOSIS — R0602 Shortness of breath: Secondary | ICD-10-CM

## 2024-09-16 LAB — ECHOCARDIOGRAM COMPLETE
AR max vel: 2.37 cm2
AV Area VTI: 2.26 cm2
AV Area mean vel: 2.14 cm2
AV Mean grad: 3 mmHg
AV Peak grad: 5.4 mmHg
Ao pk vel: 1.16 m/s
Area-P 1/2: 3.93 cm2
S' Lateral: 2.7 cm

## 2025-04-21 ENCOUNTER — Encounter (HOSPITAL_BASED_OUTPATIENT_CLINIC_OR_DEPARTMENT_OTHER): Payer: Self-pay | Admitting: Family Medicine
